# Patient Record
Sex: Female | Born: 1970 | Race: Black or African American | Hispanic: No | Marital: Single
Health system: Midwestern US, Community
[De-identification: ages and names within clinical notes are randomized; demographics above are authoritative.]

## PROBLEM LIST (undated history)

## (undated) VITALS — BP 115/79 | HR 109 | Temp 98.2°F | Resp 16 | Ht 61.5 in | Wt 134.0 lb

## (undated) DIAGNOSIS — R928 Other abnormal and inconclusive findings on diagnostic imaging of breast: Principal | ICD-10-CM

## (undated) DIAGNOSIS — Q396 Congenital diverticulum of esophagus: Principal | ICD-10-CM

## (undated) DIAGNOSIS — Z1231 Encounter for screening mammogram for malignant neoplasm of breast: Principal | ICD-10-CM

## (undated) DIAGNOSIS — R197 Diarrhea, unspecified: Secondary | ICD-10-CM

## (undated) DIAGNOSIS — B349 Viral infection, unspecified: Principal | ICD-10-CM

## (undated) DIAGNOSIS — K5730 Diverticulosis of large intestine without perforation or abscess without bleeding: Principal | ICD-10-CM

## (undated) DIAGNOSIS — M797 Fibromyalgia: Secondary | ICD-10-CM

## (undated) DIAGNOSIS — M719 Bursopathy, unspecified: Secondary | ICD-10-CM

## (undated) DIAGNOSIS — D573 Sickle-cell trait: Secondary | ICD-10-CM

## (undated) HISTORY — PX: OTHER SURGICAL HISTORY: SHX169

## (undated) HISTORY — PX: CHOLECYSTECTOMY: SHX55

## (undated) HISTORY — PX: BREAST LUMPECTOMY: SHX2

---

## 1998-04-09 ENCOUNTER — Emergency Department (HOSPITAL_COMMUNITY): Admission: EM | Admit: 1998-04-09 | Discharge: 1998-04-09 | Payer: Self-pay | Admitting: Emergency Medicine

## 1998-05-12 ENCOUNTER — Ambulatory Visit (HOSPITAL_COMMUNITY): Admission: RE | Admit: 1998-05-12 | Discharge: 1998-05-13 | Payer: Self-pay | Admitting: General Surgery

## 1999-01-13 ENCOUNTER — Emergency Department (HOSPITAL_COMMUNITY): Admission: EM | Admit: 1999-01-13 | Discharge: 1999-01-13 | Payer: Self-pay | Admitting: Emergency Medicine

## 2001-04-24 ENCOUNTER — Encounter: Payer: Self-pay | Admitting: Emergency Medicine

## 2001-04-24 ENCOUNTER — Emergency Department (HOSPITAL_COMMUNITY): Admission: EM | Admit: 2001-04-24 | Discharge: 2001-04-24 | Payer: Self-pay | Admitting: Emergency Medicine

## 2001-04-27 ENCOUNTER — Emergency Department (HOSPITAL_COMMUNITY): Admission: EM | Admit: 2001-04-27 | Discharge: 2001-04-27 | Payer: Self-pay | Admitting: Emergency Medicine

## 2001-05-22 ENCOUNTER — Encounter: Admission: RE | Admit: 2001-05-22 | Discharge: 2001-05-22 | Payer: Self-pay | Admitting: Internal Medicine

## 2002-03-13 ENCOUNTER — Encounter: Admission: RE | Admit: 2002-03-13 | Discharge: 2002-03-13 | Payer: Self-pay | Admitting: Internal Medicine

## 2002-03-18 ENCOUNTER — Encounter: Admission: RE | Admit: 2002-03-18 | Discharge: 2002-03-18 | Payer: Self-pay | Admitting: Internal Medicine

## 2002-04-16 ENCOUNTER — Encounter: Admission: RE | Admit: 2002-04-16 | Discharge: 2002-04-16 | Payer: Self-pay

## 2002-05-07 ENCOUNTER — Encounter: Admission: RE | Admit: 2002-05-07 | Discharge: 2002-05-07 | Payer: Self-pay | Admitting: Internal Medicine

## 2002-07-08 ENCOUNTER — Encounter: Admission: RE | Admit: 2002-07-08 | Discharge: 2002-07-08 | Payer: Self-pay | Admitting: Internal Medicine

## 2002-11-03 ENCOUNTER — Emergency Department (HOSPITAL_COMMUNITY): Admission: EM | Admit: 2002-11-03 | Discharge: 2002-11-04 | Payer: Self-pay | Admitting: Emergency Medicine

## 2003-02-28 ENCOUNTER — Encounter: Payer: Self-pay | Admitting: *Deleted

## 2003-02-28 ENCOUNTER — Emergency Department (HOSPITAL_COMMUNITY): Admission: EM | Admit: 2003-02-28 | Discharge: 2003-02-28 | Payer: Self-pay | Admitting: Emergency Medicine

## 2003-03-01 ENCOUNTER — Ambulatory Visit (HOSPITAL_COMMUNITY): Admission: RE | Admit: 2003-03-01 | Discharge: 2003-03-01 | Payer: Self-pay | Admitting: Emergency Medicine

## 2003-05-16 ENCOUNTER — Encounter: Payer: Self-pay | Admitting: Internal Medicine

## 2003-05-16 ENCOUNTER — Inpatient Hospital Stay (HOSPITAL_COMMUNITY): Admission: EM | Admit: 2003-05-16 | Discharge: 2003-05-18 | Payer: Self-pay | Admitting: Emergency Medicine

## 2003-05-17 ENCOUNTER — Encounter: Payer: Self-pay | Admitting: Internal Medicine

## 2003-06-18 ENCOUNTER — Encounter: Admission: RE | Admit: 2003-06-18 | Discharge: 2003-06-18 | Payer: Self-pay | Admitting: Internal Medicine

## 2004-02-16 ENCOUNTER — Emergency Department (HOSPITAL_COMMUNITY): Admission: EM | Admit: 2004-02-16 | Discharge: 2004-02-16 | Payer: Self-pay | Admitting: Emergency Medicine

## 2005-07-12 ENCOUNTER — Emergency Department (HOSPITAL_COMMUNITY): Admission: EM | Admit: 2005-07-12 | Discharge: 2005-07-12 | Payer: Self-pay | Admitting: Emergency Medicine

## 2006-03-07 ENCOUNTER — Emergency Department (HOSPITAL_COMMUNITY): Admission: EM | Admit: 2006-03-07 | Discharge: 2006-03-07 | Payer: Self-pay | Admitting: Emergency Medicine

## 2006-03-14 ENCOUNTER — Encounter: Admission: RE | Admit: 2006-03-14 | Discharge: 2006-03-14 | Payer: Self-pay | Admitting: Gastroenterology

## 2006-03-21 ENCOUNTER — Encounter: Admission: RE | Admit: 2006-03-21 | Discharge: 2006-03-21 | Payer: Self-pay | Admitting: Gastroenterology

## 2007-01-22 ENCOUNTER — Emergency Department (HOSPITAL_COMMUNITY): Admission: EM | Admit: 2007-01-22 | Discharge: 2007-01-22 | Payer: Self-pay | Admitting: Emergency Medicine

## 2008-07-17 ENCOUNTER — Ambulatory Visit (HOSPITAL_COMMUNITY): Admission: RE | Admit: 2008-07-17 | Discharge: 2008-07-17 | Payer: Self-pay | Admitting: Obstetrics and Gynecology

## 2009-04-05 ENCOUNTER — Other Ambulatory Visit (HOSPITAL_COMMUNITY): Admission: RE | Admit: 2009-04-05 | Discharge: 2009-04-21 | Payer: Self-pay | Admitting: Psychiatry

## 2009-04-06 ENCOUNTER — Ambulatory Visit: Payer: Self-pay | Admitting: Psychiatry

## 2009-04-29 ENCOUNTER — Encounter: Admission: RE | Admit: 2009-04-29 | Discharge: 2009-04-29 | Payer: Self-pay | Admitting: Internal Medicine

## 2009-09-16 ENCOUNTER — Emergency Department (HOSPITAL_COMMUNITY): Admission: EM | Admit: 2009-09-16 | Discharge: 2009-09-16 | Payer: Self-pay | Admitting: Emergency Medicine

## 2010-01-13 ENCOUNTER — Emergency Department (HOSPITAL_COMMUNITY): Admission: EM | Admit: 2010-01-13 | Discharge: 2010-01-13 | Payer: Self-pay | Admitting: Emergency Medicine

## 2010-03-18 ENCOUNTER — Encounter: Admission: RE | Admit: 2010-03-18 | Discharge: 2010-03-18 | Payer: Self-pay | Admitting: Obstetrics and Gynecology

## 2011-03-12 LAB — LIPASE, BLOOD: Lipase: 17 U/L (ref 11–59)

## 2011-03-12 LAB — DIFFERENTIAL
Basophils Absolute: 0.4 10*3/uL — ABNORMAL HIGH (ref 0.0–0.1)
Basophils Relative: 4 % — ABNORMAL HIGH (ref 0–1)
Eosinophils Relative: 2 % (ref 0–5)
Lymphocytes Relative: 41 % (ref 12–46)
Monocytes Absolute: 0.7 10*3/uL (ref 0.1–1.0)
Monocytes Relative: 7 % (ref 3–12)
Neutro Abs: 4.8 10*3/uL (ref 1.7–7.7)
Neutrophils Relative %: 47 % (ref 43–77)

## 2011-03-12 LAB — RETICULOCYTES
RBC.: 4.14 MIL/uL (ref 3.87–5.11)
Retic Count, Absolute: 45.5 10*3/uL (ref 19.0–186.0)
Retic Ct Pct: 1.1 % (ref 0.4–3.1)

## 2011-03-12 LAB — CBC
HCT: 37.7 % (ref 36.0–46.0)
MCHC: 34.1 g/dL (ref 30.0–36.0)
MCV: 88.3 fL (ref 78.0–100.0)
Platelets: 361 10*3/uL (ref 150–400)
WBC: 10.3 10*3/uL (ref 4.0–10.5)

## 2011-03-12 LAB — COMPREHENSIVE METABOLIC PANEL
Albumin: 3.5 g/dL (ref 3.5–5.2)
BUN: 8 mg/dL (ref 6–23)
Calcium: 9.1 mg/dL (ref 8.4–10.5)
GFR calc non Af Amer: >60 mL/min (ref >60)
Glucose, Bld: 98 mg/dL (ref 70–99)
Potassium: 3.8 mEq/L (ref 3.5–5.1)
Total Protein: 6.6 g/dL (ref 6.0–8.3)

## 2011-03-31 LAB — CBC
HCT: 40.9 % (ref 36.0–46.0)
Hemoglobin: 14.1 g/dL (ref 12.0–15.0)
MCV: 86.8 fL (ref 78.0–100.0)
RBC: 4.71 MIL/uL (ref 3.87–5.11)
RDW: 14.1 % (ref 11.5–15.5)
WBC: 12.9 10*3/uL — ABNORMAL HIGH (ref 4.0–10.5)

## 2011-03-31 LAB — COMPREHENSIVE METABOLIC PANEL
AST: 33 U/L (ref 0–37)
Calcium: 9.3 mg/dL (ref 8.4–10.5)
Creatinine, Ser: 0.78 mg/dL (ref 0.4–1.2)
Total Bilirubin: 0.7 mg/dL (ref 0.3–1.2)
Total Protein: 7.3 g/dL (ref 6.0–8.3)

## 2011-03-31 LAB — DIFFERENTIAL
Basophils Absolute: 0.1 10*3/uL (ref 0.0–0.1)
Basophils Relative: 1 % (ref 0–1)
Eosinophils Relative: 0 % (ref 0–5)
Lymphocytes Relative: 26 % (ref 12–46)
Lymphs Abs: 3.4 10*3/uL (ref 0.7–4.0)
Monocytes Absolute: 0.4 10*3/uL (ref 0.1–1.0)
Monocytes Relative: 4 % (ref 3–12)
Neutrophils Relative %: 69 % (ref 43–77)

## 2011-03-31 LAB — URINE MICROSCOPIC-ADD ON

## 2011-03-31 LAB — URINALYSIS, ROUTINE W REFLEX MICROSCOPIC
Bilirubin Urine: NEGATIVE
Glucose, UA: NEGATIVE mg/dL
Nitrite: NEGATIVE
Specific Gravity, Urine: 1.026 (ref 1.005–1.030)

## 2011-05-02 ENCOUNTER — Other Ambulatory Visit: Payer: Self-pay | Admitting: Obstetrics and Gynecology

## 2011-05-02 DIAGNOSIS — Z1231 Encounter for screening mammogram for malignant neoplasm of breast: Secondary | ICD-10-CM

## 2011-05-09 ENCOUNTER — Ambulatory Visit: Payer: Self-pay

## 2011-05-12 NOTE — Discharge Summary (Signed)
NAME:  Melissa Maynard, Melissa Maynard                          ACCOUNT NO.:  [id number]   MEDICAL RECORD NO.:  [id number]                   PATIENT TYPE:  INP   LOCATION:  5524                                 FACILITY:  [facility]   PHYSICIAN:  Catalina Pizza, M.D.                     DATE OF BIRTH:  06-30-1971   DATE OF ADMISSION:  05/16/2003  DATE OF DISCHARGE:  05/18/2003                                 DISCHARGE SUMMARY   DISCHARGE DIAGNOSES:  1. Left leg swelling.  2. Mild anemia.  3. Trichomonas infection.  4. Multiple kidney lesions consistent with angiomyolipomas.   DISCHARGE MEDICATION:  Ibuprofen 800 mg p.o. q.8h. p.r.n. for pain.   CHIEF COMPLAINT:  Swelling and pain in the left leg.   HISTORY OF PRESENT ILLNESS:  This is a 40 year old African American female  with minimal past medical history who presented to the emergency department  with five day history of left leg swelling and pain for the past three days.  A throbbing pain with pain radiating up into the hip.  She has felt like she  had chills but no fevers, no nausea or vomiting.  She apparently had similar  episode several months ago and was given blood thinner and antibiotics but  the work-up at that time was totally negative for blood clot and thought to  be related to infection.  Only worked up in the emergency department and  there was no follow-up.  She states that she had had several episodes of  swelling periodically since the initial episode but they have resolved  within the same day.  Pain in her leg and swelling is limiting her mobility  at this time.  She also notes that she has had some small oral lesions which  have now resolved.   ALLERGIES:  No known drug allergies.   MEDICATIONS ON ADMISSION:  1. Vicodin 5/500 p.r.n.  2. Aleve p.r.n.  3. Just received first dose of Depo-Provera one month previous.   PAST MEDICAL HISTORY:  1. Sickle cell trait.  2. Status post cholecystectomy in 1999.  3. Cyst removed from left  breast which was benign.   HABITS:  She smokes approximately half a pack for day for 12 days.  No  alcohol or drug use.   SOCIAL HISTORY:  She is single.  She works at [employer] and has lived in  [place], [place], for seven to eight years when she moved from  [place].   FAMILY HISTORY:  Mother is alive at 15 and has sickle cell anemia.  No other  problems in father, siblings, and no children.  She has history of a  grandmother who has breast cancer and a grandfather who had lung cancer.   PHYSICAL EXAMINATION UPON DISCHARGE:  GENERAL APPEARANCE:  African American  female in no acute distress.  VITAL SIGNS:  Temperature 98.7, blood pressure 99/51, pulse  75, respiratory  rate 18, 97% on room air.  HEENT:  Totally negative with no signs of oral lesions.  LUNGS:  Clear to auscultation bilaterally.  CARDIOVASCULAR:  Regular rate and rhythm with no murmurs, rubs, or gallops.  ABDOMEN:  Benign.  EXTREMITIES:  Grossly normal and symmetric upon discharge.  Upon admission,  she had significantly larger left thigh and calf as compared to her right.  She had good range of motion and 2+ pulses in all extremities.   SIGNIFICANT LABS DURING ADMISSION:  Slight Y count of 11.2.  Hemoglobin upon  discharge 12.2, hematocrit 35.3, MCV 82.5, platelets 417.  D-dimer less than  0.22.  CMET was totally normal.  TSH was within normal range at 1.868.  UA  showed small leukocyte esterase and few bacteria.  Trichomonas was noted.   STUDIES OBTAINED DURING HOSPITALIZATION:  1. Chest x-ray was read as negative for acute cardiac or pulmonary process.  2. CT scan of the abdomen and pelvis revealed multiple fatty lesions within     the kidneys that demonstrated no findings consistent with     angiomyolipomas.  Abdominal findings and pelvic findings were negative.  3. MRI of abdomen showed the presence of bilateral lesions consistent with     angiomyolipomas which would suggest tuber sclerosis but all  lesions were     less than 1 cm and suggest following up on these in the future.  4. Lower extremity Dopplers.  There was no evidence of DVT, superficial     thrombosis or Baker's cyst.   HOSPITAL COURSE:  PROBLEM #1 -  LEFT LEG SWELLING:  Unknown what the reason  for this difference in lower extremity swelling was.  All findings were  negative on examination as well as were negative on all tests performed on  her  left leg and this resolved without any significant treatment.  Dopplers  were negative and unknown exactly what the cause of this was.  Follow-up may  be some kind of intra-abdominal compression on vessels but nothing seen on  CT scan or MRI.   PROBLEM #2 -  TRICHOMONAS:  The patient was treated with 2 g dose of Flagyl.  She had not had any symptoms both related to the infection or to the  medications.   PROBLEM #3 -  MILD ANEMIA:  The patient's hemoglobin stayed in the stable  range from 11.5 to 12.5 during hospitalization.  I do not think this is  significant enough to work up any anemia.   PROBLEM #4 -  RENAL CYSTS:  Consistent with angiomyolipomas.  The patient  apparently had known about this before.  There was concern of tuber  sclerosis that was worked up while she was in [place] with head CT and  MRI which have all been negative but she does not exhibit any external  findings associated with tuber sclerosis nor cognitive disability, but on  the CT and MRI findings they showed small bilateral lesions in the kidney  consistent with angiomyolipomas and suggest that following this up with  periodic CT scan may be appropriate because if these lesions get larger,  they can cause bleeding.   DISPOSITION:  The patient is to call the outpatient clinic at [phone] if  she has any more swelling in her legs.  She is to follow up with Dr. Margo Aye on  June 18, 2003, at 10:50 a.m. for hospital follow-up.  Catalina Pizza,  M.D.  ZH/MEDQ  D:  11/05/2003  T:  11/06/2003  Job:  [id number]

## 2011-05-16 ENCOUNTER — Ambulatory Visit
Admission: RE | Admit: 2011-05-16 | Discharge: 2011-05-16 | Disposition: A | Discharge status: Home / Self Care | Payer: BC Managed Care – PPO | Source: Ambulatory Visit | Attending: Obstetrics and Gynecology | Admitting: Obstetrics and Gynecology

## 2011-05-16 DIAGNOSIS — Z1231 Encounter for screening mammogram for malignant neoplasm of breast: Secondary | ICD-10-CM

## 2011-06-19 ENCOUNTER — Other Ambulatory Visit: Payer: Self-pay | Admitting: Obstetrics and Gynecology

## 2011-06-19 ENCOUNTER — Ambulatory Visit (HOSPITAL_COMMUNITY)
Admission: RE | Admit: 2011-06-19 | Discharge: 2011-06-19 | Disposition: A | Discharge status: Home / Self Care | Payer: BC Managed Care – PPO | Source: Ambulatory Visit | Attending: Obstetrics and Gynecology | Admitting: Obstetrics and Gynecology

## 2011-06-19 DIAGNOSIS — N920 Excessive and frequent menstruation with regular cycle: Secondary | ICD-10-CM | POA: Insufficient documentation

## 2011-06-19 LAB — CBC
HCT: 37.3 % (ref 36.0–46.0)
MCV: 83.8 fL (ref 78.0–100.0)
Platelets: 391 10*3/uL (ref 150–400)
RDW: 14.1 % (ref 11.5–15.5)

## 2011-06-30 NOTE — Op Note (Signed)
  NAMEROYALTI, SCHAUF                ACCOUNT NO.:  [id number]  MEDICAL RECORD NO.:  [id number]  LOCATION:  [facility]                          FACILITY:  [facility]  PHYSICIAN:  Lenoard Aden, M.D.DATE OF BIRTH:  1971-02-07  DATE OF PROCEDURE:  06/19/2011 DATE OF DISCHARGE:                              OPERATIVE REPORT   PREOPERATIVE DIAGNOSIS:  Refractory menorrhagia.  POSTOPERATIVE DIAGNOSIS:  Refractory menorrhagia.  PROCEDURE:  Diagnostic hysteroscopy, dilatation and curettage, Novasure endometrial ablation.  SURGEON:  Lenoard Aden, MD  ASSISTANT:  None.  ANESTHESIA:  General and local.  ESTIMATED BLOOD LOSS:  Less than 50 mL.  FLUID DEFICIT:  50 mL.  COMPLICATIONS:  None.  DRAINS:  None.  COUNTS:  Correct.  CONDITION:  The patient went to recovery in good condition.  SPECIMENS:  Endometrial curettings to pathology.  BRIEF OPERATIVE NOTE:  After being apprised of risks of anesthesia, infection, bleeding, injury to intra-abdominal organs, need for repair, delayed versus immediate complications including bowel and bladder injury, possible need for repair, inability to cure all menstrual bleeding, the patient was brought to the operating room.  She was administered general anesthetic without complications.  She was prepped and draped in the usual sterile fashion.  Foley catheterized until the bladder was empty.  Exam under anesthesia revealed a small anteflexed uterus and no adnexal masses.  At this time, a weighted speculum was placed and dilute paracervical block using 20 mL of dilute Marcaine solution was placed.  Cervix was easily dilated up to #[facility] dilator. Hysteroscope was placed.  Visualization reveals normal endometrial cavity.  Endometrial curettings were collected in a four-quadrant method using sharp curettage.  At this time, the Novasure device was placed. Settings are rendered at 6 cm for adjusted length and seated to a width of 4 cm.  The CO2 test  was performed and negative.  The procedure was then initiated for 1 minute and 25 seconds without complications. Devices were removed, inspected, and found to be intact. Revisualization of the endometrial cavity reveals a well-ablated endometrial cavity.  No evidence of perforation.  Good hemostasis noted. The patient tolerated the procedure well, was awakened and transferred to recovery in good condition.     Lenoard Aden, M.D.     RJT/MEDQ  D:  06/19/2011  T:  06/20/2011  Job:  [id number]  Electronically Signed by Olivia Mackie M.D. on 06/30/2011 07:35:53 AM

## 2011-11-06 ENCOUNTER — Encounter: Payer: Self-pay | Admitting: *Deleted

## 2011-11-06 ENCOUNTER — Emergency Department (HOSPITAL_COMMUNITY)
Admission: EM | Admit: 2011-11-06 | Discharge: 2011-11-06 | Discharge status: Against Medical Advice | Payer: BC Managed Care – PPO | Attending: Emergency Medicine | Admitting: Emergency Medicine

## 2011-11-06 ENCOUNTER — Emergency Department (HOSPITAL_COMMUNITY): Payer: BC Managed Care – PPO

## 2011-11-06 DIAGNOSIS — R0602 Shortness of breath: Secondary | ICD-10-CM | POA: Insufficient documentation

## 2011-11-06 DIAGNOSIS — M791 Myalgia, unspecified site: Secondary | ICD-10-CM

## 2011-11-06 DIAGNOSIS — R079 Chest pain, unspecified: Secondary | ICD-10-CM | POA: Insufficient documentation

## 2011-11-06 DIAGNOSIS — R5381 Other malaise: Secondary | ICD-10-CM | POA: Insufficient documentation

## 2011-11-06 DIAGNOSIS — IMO0001 Reserved for inherently not codable concepts without codable children: Secondary | ICD-10-CM | POA: Insufficient documentation

## 2011-11-06 DIAGNOSIS — D573 Sickle-cell trait: Secondary | ICD-10-CM | POA: Insufficient documentation

## 2011-11-06 HISTORY — DX: Sickle-cell trait: D57.3

## 2011-11-06 HISTORY — DX: Fibromyalgia: M79.7

## 2011-11-06 LAB — URINALYSIS, ROUTINE W REFLEX MICROSCOPIC
Bilirubin Urine: NEGATIVE
Glucose, UA: NEGATIVE mg/dL
Hgb urine dipstick: NEGATIVE
Ketones, ur: NEGATIVE mg/dL
Leukocytes, UA: NEGATIVE
Nitrite: NEGATIVE
Protein, ur: NEGATIVE mg/dL
Specific Gravity, Urine: 1.014 (ref 1.005–1.030)
Urobilinogen, UA: 1 mg/dL (ref 0.0–1.0)
pH: 7 (ref 5.0–8.0)

## 2011-11-06 MED ORDER — IBUPROFEN 800 MG PO TABS: 800.0000 mg | ORAL_TABLET | Freq: Three times a day (TID) | ORAL | Status: AC | PRN

## 2011-11-06 MED ORDER — KETOROLAC TROMETHAMINE 30 MG/ML IJ SOLN: 60.0000 mg | Freq: Once | INTRAMUSCULAR | Status: DC

## 2011-11-06 MED ORDER — OXYCODONE-ACETAMINOPHEN 5-325 MG PO TABS: 1.0000 | ORAL_TABLET | Freq: Four times a day (QID) | ORAL | Status: AC | PRN

## 2011-11-06 MED ORDER — HEPARIN SOD (PORK) LOCK FLUSH 100 UNIT/ML IV SOLN
INTRAVENOUS | Status: AC
  Filled 2011-11-06: qty 5

## 2011-11-06 NOTE — ED Notes (Signed)
I-STAT results shown to PA Lawyer, due to lab not crossing over. Results all within normal limits

## 2011-11-06 NOTE — ED Notes (Signed)
When I went to given the pt. Her med. She was not in the room.

## 2011-11-06 NOTE — ED Notes (Signed)
Unable to find pt

## 2011-11-06 NOTE — ED Provider Notes (Signed)
History     CSN: [id number] Arrival date & time: 11/06/2011  5:51 PM   First MD Initiated Contact with Patient 11/06/11 1801      Chief Complaint  Patient presents with  . Generalized Body Aches    (Consider location/radiation/quality/duration/timing/severity/associated sxs/prior treatment) HPI Patient presents with three-day history of bodyaches, and generalized fatigue.  Patient states, that she has had some mild shortness of breath today, but no cough no runny nose, sore throat, fever, vomiting, nausea, headache, diarrhea, visual changes or chest pain.  Patient states, that she has not had consistent shortness of breath, which she thinks may be related to some anxiety.  Patient states she has a history of fibromyalgia and this could be contributing to her body aches.  Past Medical History  Diagnosis Date  . Fibromyalgia   . Sickle cell trait     Past Surgical History  Procedure Date  . Cholecystectomy     No family history on file.  History  Substance Use Topics  . Smoking status: Current Some Day Smoker  . Smokeless tobacco: Not on file  . Alcohol Use: Yes     occasionally    OB History    Grav Para Term Preterm Abortions TAB SAB Ect Mult Living                  Review of Systems  Constitutional: Positive for fatigue. Negative for fever.  All other systems reviewed and are negative.    Allergies  Review of patient's allergies indicates no known allergies.  Home Medications   Current Outpatient Rx  Name Route Sig Dispense Refill  . ALPRAZOLAM 1 MG PO TABS Oral Take 1 mg by mouth 3 (three) times daily as needed. For anxiety.     . AMITRIPTYLINE HCL 25 MG PO TABS Oral Take 25-50 mg by mouth at bedtime.      Marland Kitchen VITAMIN D 1000 UNITS PO TABS Oral Take 1,000 Units by mouth daily.      . DULOXETINE HCL 20 MG PO CPEP Oral Take 20 mg by mouth daily.      Marland Kitchen HYDROCODONE-ACETAMINOPHEN 5-500 MG PO TABS Oral Take 1-2 tablets by mouth every 6 (six) hours as needed. For  pain.     Carma Leaven M PLUS PO TABS Oral Take 1 tablet by mouth daily.      Marland Kitchen VITAMIN B-12 1000 MCG PO TABS Oral Take 1,000 mcg by mouth daily.      Marland Kitchen VITAMIN C 500 MG PO TABS Oral Take 500 mg by mouth daily.        BP 138/88  Pulse 91  Temp(Src) 98.9 F (37.2 C) (Oral)  Resp 18  SpO2 100%  Physical Exam  Nursing note and vitals reviewed. Constitutional: She is oriented to person, place, and time. She appears well-developed and well-nourished. No distress.  HENT:  Head: Normocephalic and atraumatic.  Right Ear: Tympanic membrane normal.  Left Ear: Tympanic membrane normal.  Nose: Nose normal.  Mouth/Throat: Uvula is midline, oropharynx is clear and moist and mucous membranes are normal. No uvula swelling. No oropharyngeal exudate, posterior oropharyngeal edema, posterior oropharyngeal erythema or tonsillar abscesses.  Eyes: Pupils are equal, round, and reactive to light.  Neck: Normal range of motion. Neck supple.  Cardiovascular: Normal rate, regular rhythm and normal heart sounds.   Pulmonary/Chest: Effort normal and breath sounds normal.  Abdominal: Soft. Bowel sounds are normal. There is no tenderness. There is no rebound and no guarding.  Musculoskeletal: She exhibits  no edema.  Neurological: She is alert and oriented to person, place, and time. No cranial nerve deficit. Coordination normal.  Skin: Skin is warm and dry. No rash noted.  Psychiatric: She has a normal mood and affect. Her behavior is normal. Judgment and thought content normal.    ED Course  Procedures (including critical care time)  Labs Reviewed  URINALYSIS, ROUTINE W REFLEX MICROSCOPIC - Abnormal; Notable for the following:    Appearance CLOUDY (*)    All other components within normal limits  I-STAT, CHEM 8   8:39 PM patient is awaiting results of her chest x-ray.  All of her other testing has been normal.    Results for orders placed during the hospital encounter of 11/06/11  URINALYSIS, ROUTINE W  REFLEX MICROSCOPIC      Component Value Range   Color, Urine YELLOW  YELLOW    Appearance CLOUDY (*) CLEAR    Specific Gravity, Urine 1.014  1.005 - 1.030    pH 7.0  5.0 - 8.0    Glucose, UA NEGATIVE  NEGATIVE (mg/dL)   Hgb urine dipstick NEGATIVE  NEGATIVE    Bilirubin Urine NEGATIVE  NEGATIVE    Ketones, ur NEGATIVE  NEGATIVE (mg/dL)   Protein, ur NEGATIVE  NEGATIVE (mg/dL)   Urobilinogen, UA 1.0  0.0 - 1.0 (mg/dL)   Nitrite NEGATIVE  NEGATIVE    Leukocytes, UA NEGATIVE  NEGATIVE    Dg Chest 2 View  11/06/2011  *RADIOLOGY REPORT*  Clinical Data: 40 year old female with shortness of breath, chest pain.  CHEST - 2 VIEW  Comparison: None.  Findings:  Cardiac size and mediastinal contours are within normal limits.  Mild increased interstitial markings diffusely.  No pneumothorax, pulmonary edema, pleural effusion or confluent pulmonary opacity.  Right upper quadrant surgical clips. Visualized tracheal air column is within normal limits.  No acute osseous abnormality identified.  IMPRESSION: Chronic-appearing increased interstitial markings. No acute cardiopulmonary abnormality.  Original Report Authenticated By: Harley Hallmark, M.D.    Chest x-ray results reviewed by me and there is no acute findings.     MDM  Patient has normal vital signs here in the emergency department, along with lab testing, as well as a normal chest.  Patient does have issues with anxiety and she states earlier that this may be resulting in some of her shortness of breath.  Has been intermittent throughout the day.  Patient has just generalized bodyaches, with no source that is obtainable at this time.  Patient will be given pain control and advised to return here as needed.  For any worsening in her condition.  She is advised to follow up with her doctor for recheck.        Melissa Maynard [place], [place] 11/06/11 2042

## 2011-11-06 NOTE — ED Notes (Signed)
Pt reports body aches x 3 days, and SOB since yesterday.  Pt reports hx of fibromyalgia.  Denies any fever at present.

## 2011-11-07 NOTE — ED Provider Notes (Signed)
Medical screening examination/treatment/procedure(s) were performed by non-physician practitioner and as supervising physician I was immediately available for consultation/collaboration.    Patrisha Hausmann R Christop Hippert, MD 11/07/11 0000 

## 2011-11-08 LAB — POCT I-STAT, CHEM 8
Calcium, Ion: 1.12 mmol/L (ref 1.12–1.32)
Hemoglobin: 12.9 g/dL (ref 12.0–15.0)
Sodium: 139 mEq/L (ref 135–145)
TCO2: 27 mmol/L (ref 0–100)

## 2012-05-15 ENCOUNTER — Emergency Department (HOSPITAL_COMMUNITY): Payer: Self-pay

## 2012-05-15 ENCOUNTER — Encounter (HOSPITAL_COMMUNITY): Payer: Self-pay | Admitting: *Deleted

## 2012-05-15 ENCOUNTER — Emergency Department (HOSPITAL_COMMUNITY)
Admission: EM | Admit: 2012-05-15 | Discharge: 2012-05-15 | Disposition: A | Discharge status: Home / Self Care | Payer: Self-pay | Attending: Emergency Medicine | Admitting: Emergency Medicine

## 2012-05-15 DIAGNOSIS — IMO0001 Reserved for inherently not codable concepts without codable children: Secondary | ICD-10-CM | POA: Insufficient documentation

## 2012-05-15 DIAGNOSIS — F17200 Nicotine dependence, unspecified, uncomplicated: Secondary | ICD-10-CM | POA: Insufficient documentation

## 2012-05-15 DIAGNOSIS — R0602 Shortness of breath: Secondary | ICD-10-CM | POA: Insufficient documentation

## 2012-05-15 DIAGNOSIS — J069 Acute upper respiratory infection, unspecified: Secondary | ICD-10-CM | POA: Insufficient documentation

## 2012-05-15 DIAGNOSIS — R062 Wheezing: Secondary | ICD-10-CM | POA: Insufficient documentation

## 2012-05-15 DIAGNOSIS — R059 Cough, unspecified: Secondary | ICD-10-CM | POA: Insufficient documentation

## 2012-05-15 DIAGNOSIS — R05 Cough: Secondary | ICD-10-CM | POA: Insufficient documentation

## 2012-05-15 MED ORDER — ALBUTEROL SULFATE (5 MG/ML) 0.5% IN NEBU
5.0000 mg | INHALATION_SOLUTION | Freq: Once | RESPIRATORY_TRACT | Status: AC
  Administered 2012-05-15: 5 mg via RESPIRATORY_TRACT
  Filled 2012-05-15: qty 1

## 2012-05-15 MED ORDER — PREDNISONE 20 MG PO TABS
60.0000 mg | ORAL_TABLET | Freq: Once | ORAL | Status: AC
  Administered 2012-05-15: 60 mg via ORAL
  Filled 2012-05-15: qty 3

## 2012-05-15 MED ORDER — IPRATROPIUM BROMIDE 0.02 % IN SOLN
0.5000 mg | Freq: Once | RESPIRATORY_TRACT | Status: AC
  Administered 2012-05-15: 0.5 mg via RESPIRATORY_TRACT
  Filled 2012-05-15: qty 2.5

## 2012-05-15 MED ORDER — PREDNISONE 20 MG PO TABS: 60.0000 mg | ORAL_TABLET | Freq: Every day | ORAL | Status: AC

## 2012-05-15 NOTE — Discharge Instructions (Signed)
Read the instructions below on reasons to return to the emergency department and to learn more about your diagnosis.  Use over the counter medications for symptomatic relief as we discussed (musinex as a decongestant, Tylenol for fever/pain, Motrin/Ibuprofen for muscle aches).  Followup with your primary care doctor in 4 days if your symptoms persist.  Your more than welcome to return to the emergency department if symptoms worsen or become concerning. ° °Upper Respiratory Infection, Adult  °An upper respiratory infection (URI) is also sometimes known as the common cold. Most people improve within 1 week, but symptoms can last up to 2 weeks. A residual cough may last even longer.  ° °URI is most commonly caused by a virus. Viruses are NOT treated with antibiotics. You can easily spread the virus to others by oral contact. This includes kissing, sharing a glass, coughing, or sneezing. Touching your mouth or nose and then touching a surface, which is then touched by another person, can also spread the virus.  ° °TREATMENT  °Treatment is directed at relieving symptoms. There is no cure. Antibiotics are not effective, because the infection is caused by a virus, not by bacteria. Treatment may include:  °Increased fluid intake. Sports drinks offer valuable electrolytes, sugars, and fluids.  °Breathing heated mist or steam (vaporizer or shower).  °Eating chicken soup or other clear broths, and maintaining good nutrition.  °Getting plenty of rest.  °Using gargles or lozenges for comfort.  °Controlling fevers with ibuprofen or acetaminophen as directed by your caregiver.  °Increasing usage of your inhaler if you have asthma.  °Return to work when your temperature has returned to normal.  ° °SEEK MEDICAL CARE IF:  °After the first few days, you feel you are getting worse rather than better.  °You develop worsening shortness of breath, or brown or red sputum. These may be signs of pneumonia.  °You develop yellow or brown  nasal discharge or pain in the face, especially when you bend forward. These may be signs of sinusitis.  °You develop a fever, swollen neck glands, pain with swallowing, or white areas in the back of your throat. These may be signs of strep throat.  ° °

## 2012-05-15 NOTE — ED Notes (Signed)
Pt reports history of bronchitis. Pt reports symptoms started this weekend. Pt has used nebulizer once a day and inhaler twice today.  Pt reports no improvement in symptoms. Pt reports productive cough. Pt reports sputum is green.  Pt denies noting fevers.  Pt denies smoking. Pt has expiratory and inspiratory wheezing noted.   Pt reports knee pain that is worse on left knee.  Pt denies injury or increased activity.  Pt has had a Vicodin for pain this AM and states this medication did not help pain.

## 2012-05-15 NOTE — ED Notes (Signed)
Resp therapy at pt bedside finishing pt breathing treatment. Pt in no apparent distress.

## 2012-05-15 NOTE — ED Notes (Signed)
Pt states she started to have chest congestion over the weekend. Pt states she is using an inhaler and started to have a productive cough. Pt also states she is having generalized body aches

## 2012-05-15 NOTE — ED Provider Notes (Signed)
History     CSN: [id number]  Arrival date & time 05/15/12  1605   First MD Initiated Contact with Patient 05/15/12 1646      Chief Complaint  Patient presents with  . URI    (Consider location/radiation/quality/duration/timing/severity/associated sxs/prior treatment) HPI Comments: Patient presenting with productive cough, wheezing, and nasal congestion for the past 4 days.  She has tried using Robitussion, Mucinex, and used her Albuterol inhaler with mild relief.  She currently smokes 5 cigarettes/day.    Patient is a 42 y.o. female presenting with cough. The history is provided by the patient.  Cough This is a new problem. Episode onset: 4 days ago. The problem has been gradually worsening. The cough is productive of sputum. There has been no fever. Associated symptoms include rhinorrhea, myalgias, shortness of breath and wheezing. Pertinent negatives include no chest pain, no chills, no ear congestion, no ear pain and no sore throat. She is a smoker.    Past Medical History  Diagnosis Date  . Fibromyalgia   . Sickle cell trait     Past Surgical History  Procedure Date  . Cholecystectomy     No family history on file.  History  Substance Use Topics  . Smoking status: Current Some Day Smoker  . Smokeless tobacco: Not on file  . Alcohol Use: Yes     occasionally    OB History    Grav Para Term Preterm Abortions TAB SAB Ect Mult Living                  Review of Systems  Constitutional: Negative for fever, chills and diaphoresis.  HENT: Positive for congestion and rhinorrhea. Negative for ear pain, sore throat and sinus pressure.   Respiratory: Positive for cough, shortness of breath and wheezing.   Cardiovascular: Negative for chest pain and leg swelling.  Gastrointestinal: Negative for nausea and vomiting.  Musculoskeletal: Positive for myalgias.  Skin: Negative for rash.  Neurological: Negative for dizziness, syncope and light-headedness.    Allergies    Review of patient's allergies indicates no known allergies.  Home Medications   Current Outpatient Rx  Name Route Sig Dispense Refill  . ALPRAZOLAM 1 MG PO TABS Oral Take 1 mg by mouth 3 (three) times daily as needed. For anxiety.     . AMITRIPTYLINE HCL 25 MG PO TABS Oral Take 25-50 mg by mouth at bedtime.      Marland Kitchen VITAMIN D 1000 UNITS PO TABS Oral Take 1,000 Units by mouth daily.      . DULOXETINE HCL 20 MG PO CPEP Oral Take 20 mg by mouth daily.      Marland Kitchen HYDROCODONE-ACETAMINOPHEN 5-500 MG PO TABS Oral Take 1-2 tablets by mouth every 6 (six) hours as needed. For pain.     Carma Leaven M PLUS PO TABS Oral Take 1 tablet by mouth daily.      Marland Kitchen VITAMIN B-12 1000 MCG PO TABS Oral Take 1,000 mcg by mouth daily.      Marland Kitchen VITAMIN C 500 MG PO TABS Oral Take 500 mg by mouth daily.        BP 124/92  Pulse 91  Temp 98.2 F (36.8 C)  Resp 20  Ht 5\' 2"  (1.575 m)  Wt 170 lb (77.111 kg)  BMI 31.09 kg/m2  SpO2 100%  Physical Exam  Nursing note and vitals reviewed. Constitutional: She appears well-developed and well-nourished. No distress.  HENT:  Head: Normocephalic and atraumatic.  Right Ear: Tympanic membrane and ear  canal normal.  Left Ear: Tympanic membrane and ear canal normal.  Nose: Nose normal. Right sinus exhibits no maxillary sinus tenderness and no frontal sinus tenderness. Left sinus exhibits no maxillary sinus tenderness and no frontal sinus tenderness.  Mouth/Throat: Uvula is midline, oropharynx is clear and moist and mucous membranes are normal.  Neck: Normal range of motion. Neck supple.  Cardiovascular: Normal rate, regular rhythm and normal heart sounds.   Pulmonary/Chest: Effort normal. No accessory muscle usage. Not tachypneic. No respiratory distress. She has decreased breath sounds. She has wheezes. She has no rhonchi. She has no rales.       Diffuse expiratory wheezing. Patient able to speak in full sentences.  Musculoskeletal: Normal range of motion.  Neurological: She is  alert.  Skin: Skin is warm and dry. She is not diaphoretic.  Psychiatric: She has a normal mood and affect.    ED Course  Procedures (including critical care time)  Labs Reviewed - No data to display Dg Chest 2 View  05/15/2012  *RADIOLOGY REPORT*  Clinical Data: Chest pain.  Short of breath.  Cough.  CHEST - 2 VIEW  Comparison: 11/06/2011  Findings: Normal heart size. No consolidation or mass. Interstitial prominence.  Bronchitic changes.  No pneumothorax or pleural effusion.  IMPRESSION: No active cardiopulmonary disease.  Chronic changes.  Original Report Authenticated By: Donavan Burnet, M.D.     No diagnosis found.  5:47 PM Reassessed patient after breathing treatment.  She reports that she is feeling much better at this time.  Lungs CTAB.  MDM  Patient presenting with productive cough and wheezing.  No acute changes on xray.  Xray showing chronic bronchitic changes.  Patient given a breathing treatment while in ED, which she reports improved her symptoms considerably.  Patient also given course of Prednisone.  Return precautions discussed with patient.          Pascal Lux [place], PA-C 05/16/12 [phone]

## 2012-05-17 NOTE — ED Provider Notes (Signed)
Medical screening examination/treatment/procedure(s) were performed by non-physician practitioner and as supervising physician I was immediately available for consultation/collaboration.  Flint Melter, MD 05/17/12 1620

## 2012-11-22 ENCOUNTER — Emergency Department (HOSPITAL_COMMUNITY)
Admission: EM | Admit: 2012-11-22 | Discharge: 2012-11-22 | Disposition: A | Discharge status: Home / Self Care | Payer: Self-pay | Attending: Emergency Medicine | Admitting: Emergency Medicine

## 2012-11-22 ENCOUNTER — Encounter (HOSPITAL_COMMUNITY): Payer: Self-pay | Admitting: *Deleted

## 2012-11-22 ENCOUNTER — Emergency Department (HOSPITAL_COMMUNITY): Payer: Self-pay

## 2012-11-22 DIAGNOSIS — F17200 Nicotine dependence, unspecified, uncomplicated: Secondary | ICD-10-CM | POA: Insufficient documentation

## 2012-11-22 DIAGNOSIS — Z79899 Other long term (current) drug therapy: Secondary | ICD-10-CM | POA: Insufficient documentation

## 2012-11-22 DIAGNOSIS — D573 Sickle-cell trait: Secondary | ICD-10-CM | POA: Insufficient documentation

## 2012-11-22 DIAGNOSIS — Z9089 Acquired absence of other organs: Secondary | ICD-10-CM | POA: Insufficient documentation

## 2012-11-22 DIAGNOSIS — N12 Tubulo-interstitial nephritis, not specified as acute or chronic: Secondary | ICD-10-CM

## 2012-11-22 DIAGNOSIS — IMO0001 Reserved for inherently not codable concepts without codable children: Secondary | ICD-10-CM | POA: Insufficient documentation

## 2012-11-22 DIAGNOSIS — Z3202 Encounter for pregnancy test, result negative: Secondary | ICD-10-CM | POA: Insufficient documentation

## 2012-11-22 LAB — COMPREHENSIVE METABOLIC PANEL
ALT: 23 U/L (ref 0–35)
BUN: 9 mg/dL (ref 6–23)
CO2: 22 mEq/L (ref 19–32)
Calcium: 9.4 mg/dL (ref 8.4–10.5)
Creatinine, Ser: 0.57 mg/dL (ref 0.50–1.10)
GFR calc Af Amer: >90 mL/min (ref >90)
GFR calc non Af Amer: >90 mL/min (ref >90)
Glucose, Bld: 88 mg/dL (ref 70–99)
Sodium: 138 mEq/L (ref 135–145)
Total Protein: 7.1 g/dL (ref 6.0–8.3)

## 2012-11-22 LAB — CBC WITH DIFFERENTIAL/PLATELET
Eosinophils Absolute: 0 10*3/uL (ref 0.0–0.7)
Eosinophils Relative: 0 % (ref 0–5)
HCT: 36.1 % (ref 36.0–46.0)
Lymphocytes Relative: 22 % (ref 12–46)
Lymphs Abs: 1.7 10*3/uL (ref 0.7–4.0)
MCH: 28.9 pg (ref 26.0–34.0)
MCV: 82 fL (ref 78.0–100.0)
Monocytes Absolute: 0.5 10*3/uL (ref 0.1–1.0)
Platelets: 363 10*3/uL (ref 150–400)
RBC: 4.4 MIL/uL (ref 3.87–5.11)

## 2012-11-22 LAB — PREGNANCY, URINE: Preg Test, Ur: NEGATIVE

## 2012-11-22 LAB — URINALYSIS, ROUTINE W REFLEX MICROSCOPIC
Ketones, ur: >80 mg/dL — AB
Leukocytes, UA: NEGATIVE
Protein, ur: NEGATIVE mg/dL
Urobilinogen, UA: 1 mg/dL (ref 0.0–1.0)

## 2012-11-22 LAB — URINE MICROSCOPIC-ADD ON

## 2012-11-22 LAB — LIPASE, BLOOD: Lipase: 9 U/L — ABNORMAL LOW (ref 11–59)

## 2012-11-22 MED ORDER — ONDANSETRON 8 MG PO TBDP: 8.0000 mg | ORAL_TABLET | Freq: Three times a day (TID) | ORAL | Status: DC | PRN

## 2012-11-22 MED ORDER — MORPHINE SULFATE 4 MG/ML IJ SOLN: 6.0000 mg | INTRAMUSCULAR | Status: DC | PRN

## 2012-11-22 MED ORDER — ONDANSETRON HCL 4 MG/2ML IJ SOLN
4.0000 mg | Freq: Once | INTRAMUSCULAR | Status: AC
  Administered 2012-11-22: 4 mg via INTRAVENOUS
  Filled 2012-11-22: qty 2

## 2012-11-22 MED ORDER — SODIUM CHLORIDE 0.9 % IV SOLN
1000.0000 mL | Freq: Once | INTRAVENOUS | Status: AC
  Administered 2012-11-22: 1000 mL via INTRAVENOUS

## 2012-11-22 MED ORDER — SODIUM CHLORIDE 0.9 % IV SOLN
1000.0000 mL | INTRAVENOUS | Status: DC
  Administered 2012-11-22: 1000 mL via INTRAVENOUS

## 2012-11-22 MED ORDER — DEXTROSE 5 % IV SOLN
1.0000 g | Freq: Once | INTRAVENOUS | Status: AC
  Administered 2012-11-22: 1 g via INTRAVENOUS
  Filled 2012-11-22: qty 10

## 2012-11-22 MED ORDER — CEPHALEXIN 500 MG PO CAPS: 500.0000 mg | ORAL_CAPSULE | Freq: Four times a day (QID) | ORAL | Status: DC

## 2012-11-22 MED ORDER — MORPHINE SULFATE 4 MG/ML IJ SOLN
6.0000 mg | Freq: Once | INTRAMUSCULAR | Status: AC
  Administered 2012-11-22: 6 mg via INTRAVENOUS
  Filled 2012-11-22: qty 2

## 2012-11-22 MED ORDER — OXYCODONE-ACETAMINOPHEN 5-325 MG PO TABS: 1.0000 | ORAL_TABLET | ORAL | Status: DC | PRN

## 2012-11-22 NOTE — Progress Notes (Signed)
Correction in cm note account number is [id number] not [id number]

## 2012-11-22 NOTE — ED Provider Notes (Signed)
History     CSN: [id number]  Arrival date & time 11/22/12  1350   First MD Initiated Contact with Patient 11/22/12 1414      Chief Complaint  Patient presents with  . Back Pain     The history is provided by the patient.   patient reports worsening right flank pain over the past 4 days.  She's had nausea without vomiting.  She reports right-sided abdominal pain as well.  She describes burning with urination without frequency or hesitancy.  She's had no fevers or chills.  She reports her pain is moderate to severe at this time.  Her pain was intermittent initially but now is constant.  She has a history of fibromyalgia and sickle cell trait.  She is on oxycodone at home for pain and this has not helped her discomfort.  She has a history of cholecystectomy  Past Medical History  Diagnosis Date  . Fibromyalgia   . Sickle cell trait     Past Surgical History  Procedure Date  . Cholecystectomy     No family history on file.  History  Substance Use Topics  . Smoking status: Current Some Day Smoker  . Smokeless tobacco: Not on file  . Alcohol Use: Yes     Comment: occasionally    OB History    Grav Para Term Preterm Abortions TAB SAB Ect Mult Living                  Review of Systems  Musculoskeletal: Positive for back pain.  All other systems reviewed and are negative.    Allergies  Review of patient's allergies indicates no known allergies.  Home Medications   Current Outpatient Rx  Name  Route  Sig  Dispense  Refill  . ALPRAZOLAM 1 MG PO TABS   Oral   Take 1 mg by mouth 3 (three) times daily as needed. For anxiety.          . AMITRIPTYLINE HCL 25 MG PO TABS   Oral   Take 25-50 mg by mouth at bedtime as needed. For sleep.         Marland Kitchen VITAMIN D 1000 UNITS PO TABS   Oral   Take 1,000 Units by mouth daily.           . DULOXETINE HCL 20 MG PO CPEP   Oral   Take 20 mg by mouth daily.           Carma Leaven M PLUS PO TABS   Oral   Take 1 tablet by  mouth daily.           . OXYCODONE-ACETAMINOPHEN 10-325 MG PO TABS   Oral   Take 1 tablet by mouth every 4 (four) hours as needed. pain         . VITAMIN B-12 1000 MCG PO TABS   Oral   Take 1,000 mcg by mouth daily.           Marland Kitchen VITAMIN C 500 MG PO TABS   Oral   Take 500 mg by mouth daily.             BP 141/79  Pulse 91  Temp 98.3 F (36.8 C) (Oral)  Resp 16  SpO2 98%  Physical Exam  Nursing note and vitals reviewed. Constitutional: She is oriented to person, place, and time. She appears well-developed and well-nourished. No distress.  HENT:  Head: Normocephalic and atraumatic.  Eyes: EOM are normal.  Neck: Normal  range of motion.  Cardiovascular: Normal rate, regular rhythm and normal heart sounds.   Pulmonary/Chest: Effort normal and breath sounds normal.  Abdominal: Soft. She exhibits no distension.       Mild right-sided abdominal tenderness without guarding or rebound  Genitourinary:       Mild right CVA tenderness  Musculoskeletal: Normal range of motion.  Neurological: She is alert and oriented to person, place, and time.  Skin: Skin is warm and dry.  Psychiatric: She has a normal mood and affect. Judgment normal.    ED Course  Procedures (including critical care time)  Labs Reviewed  URINALYSIS, ROUTINE W REFLEX MICROSCOPIC - Abnormal; Notable for the following:    APPearance CLOUDY (*)     Hgb urine dipstick LARGE (*)     Ketones, ur >80 (*)     All other components within normal limits  URINE MICROSCOPIC-ADD ON - Abnormal; Notable for the following:    Squamous Epithelial / LPF MANY (*)     Bacteria, UA MANY (*)     All other components within normal limits  COMPREHENSIVE METABOLIC PANEL - Abnormal; Notable for the following:    Potassium 3.3 (*)     All other components within normal limits  LIPASE, BLOOD - Abnormal; Notable for the following:    Lipase 9 (*)     All other components within normal limits  CBC WITH DIFFERENTIAL    PREGNANCY, URINE  URINE CULTURE   Ct Abdomen Pelvis Wo Contrast  11/22/2012  *RADIOLOGY REPORT*  Clinical Data: Lower back/right flank pain, hematuria  CT ABDOMEN AND PELVIS WITHOUT CONTRAST  Technique:  Multidetector CT imaging of the abdomen and pelvis was performed following the standard protocol without intravenous contrast.  Comparison: 12/07/2008  Findings: Lung bases are essentially clear.  Unenhanced liver, spleen, pancreas, and adrenal glands within normal limits.  Status post cholecystectomy.  Common duct measures 8.5 mm, unchanged.  Multiple renal angiomyolipomas.  Dominant right renal lesion measures 1.5 x 1.7 cm (series 2/image 22), previously 1.5 x 1.6 cm. Dominant left renal lesion measures 1.6 x 2.0 cm (series 2/image 20), previously 1.4 x 1.7 cm.  No renal calculi or hydronephrosis.  No evidence of bowel obstruction.  No evidence of abdominal aortic aneurysm.  No abdominopelvic ascites.  No suspicious abdominopelvic lymphadenopathy.  2.1 x 2.6 x 2.0 cm lesion arising along the inferior aspect of the uterus and indenting the bladder dome (series 2/image 71), favored to reflect a uterine fibroid.  Bilateral ovaries are unremarkable.  No ureteral or bladder calculi.  Bladder is mildly thick-walled although underdistended.  Mild degenerative changes of the lower thoracic spine.  IMPRESSION: No renal, ureteral, or bladder calculi.  No hydronephrosis.  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  Bladder is mildly thick-walled, correlate for cystitis.  Suspected 2.6 cm exophytic uterine fibroid indenting the left bladder dome.  Consider pelvic ultrasound for initial evaluation as clinically warranted.   Original Report Authenticated By: Charline Bills, M.D.    I personally reviewed the imaging tests through PACS system I reviewed available ER/hospitalization records through the EMR   1. Pyelonephritis       MDM  4:23 PM The patient is  feeling better at this time.  Will treat for pyelonephritis.  CT scan without obstructing stone.  Patient be treated as an outpatient pain medicine, antibiotics, antinausea medicine patients has return to the ER for new or worsening symptoms  Lyanne Co, MD 11/22/12 [phone]

## 2012-11-22 NOTE — ED Notes (Signed)
IV team paged.  

## 2012-11-22 NOTE — ED Notes (Signed)
C/o lower back pain and flank pain for 4 days

## 2012-11-22 NOTE — Progress Notes (Signed)
CARE MANAGEMENT ED NOTE 11/22/2012  Patient:  Melissa Maynard,Melissa Maynard   Account Number:  [id number]  Date Initiated:  11/22/2012  Documentation initiated by:  Edd Arbour  Subjective/Objective Assessment:   41 yr old female bcbs c/o lower back pain abdominal pain      Subjective/Objective Assessment Detail:   no pcp listed pt states pcp is [facility] -dr Mackey Birchwood and others      Action/Plan:   Action/Plan Detail:   Anticipated DC Date:  11/22/2012     Status Recommendation to Physician:   Result of Recommendation:    Other ED Services  Consult Working Plan    DC Planning Services  Other  PCP issues    Choice offered to / List presented to:            Status of service:  Completed, signed off  ED Comments:   ED Comments Detail:

## 2012-11-22 NOTE — Progress Notes (Signed)
pcp is [facility] and other providers at [facility]

## 2012-11-24 LAB — URINE CULTURE

## 2012-11-25 ENCOUNTER — Emergency Department (HOSPITAL_COMMUNITY)
Admission: EM | Admit: 2012-11-25 | Discharge: 2012-11-25 | Disposition: A | Discharge status: Home / Self Care | Payer: Self-pay | Attending: Emergency Medicine | Admitting: Emergency Medicine

## 2012-11-25 DIAGNOSIS — D573 Sickle-cell trait: Secondary | ICD-10-CM | POA: Insufficient documentation

## 2012-11-25 DIAGNOSIS — R110 Nausea: Secondary | ICD-10-CM | POA: Insufficient documentation

## 2012-11-25 DIAGNOSIS — R6883 Chills (without fever): Secondary | ICD-10-CM | POA: Insufficient documentation

## 2012-11-25 DIAGNOSIS — N12 Tubulo-interstitial nephritis, not specified as acute or chronic: Secondary | ICD-10-CM

## 2012-11-25 DIAGNOSIS — M549 Dorsalgia, unspecified: Secondary | ICD-10-CM | POA: Insufficient documentation

## 2012-11-25 DIAGNOSIS — B029 Zoster without complications: Secondary | ICD-10-CM

## 2012-11-25 DIAGNOSIS — F419 Anxiety disorder, unspecified: Secondary | ICD-10-CM

## 2012-11-25 DIAGNOSIS — R509 Fever, unspecified: Secondary | ICD-10-CM | POA: Insufficient documentation

## 2012-11-25 DIAGNOSIS — L270 Generalized skin eruption due to drugs and medicaments taken internally: Secondary | ICD-10-CM

## 2012-11-25 DIAGNOSIS — R51 Headache: Secondary | ICD-10-CM | POA: Insufficient documentation

## 2012-11-25 DIAGNOSIS — F17200 Nicotine dependence, unspecified, uncomplicated: Secondary | ICD-10-CM | POA: Insufficient documentation

## 2012-11-25 DIAGNOSIS — Z79899 Other long term (current) drug therapy: Secondary | ICD-10-CM | POA: Insufficient documentation

## 2012-11-25 DIAGNOSIS — R599 Enlarged lymph nodes, unspecified: Secondary | ICD-10-CM | POA: Insufficient documentation

## 2012-11-25 DIAGNOSIS — F411 Generalized anxiety disorder: Secondary | ICD-10-CM | POA: Insufficient documentation

## 2012-11-25 DIAGNOSIS — R590 Localized enlarged lymph nodes: Secondary | ICD-10-CM

## 2012-11-25 MED ORDER — DIPHENHYDRAMINE HCL 25 MG PO CAPS
25.0000 mg | ORAL_CAPSULE | Freq: Once | ORAL | Status: AC
  Administered 2012-11-25: 25 mg via ORAL
  Filled 2012-11-25: qty 1

## 2012-11-25 MED ORDER — ACYCLOVIR 400 MG PO TABS: 800.0000 mg | ORAL_TABLET | Freq: Every day | ORAL | Status: DC

## 2012-11-25 MED ORDER — SULFAMETHOXAZOLE-TRIMETHOPRIM 800-160 MG PO TABS: 1.0000 | ORAL_TABLET | Freq: Two times a day (BID) | ORAL | Status: DC

## 2012-11-25 MED ORDER — LORAZEPAM 1 MG PO TABS
1.0000 mg | ORAL_TABLET | Freq: Once | ORAL | Status: AC
  Administered 2012-11-25: 1 mg via ORAL
  Filled 2012-11-25: qty 1

## 2012-11-25 MED ORDER — HYDROXYZINE HCL 25 MG PO TABS: 25.0000 mg | ORAL_TABLET | Freq: Four times a day (QID) | ORAL | Status: DC

## 2012-11-25 NOTE — ED Notes (Signed)
Pt states she was seen here on 11/29 and treated for kidney infection. Pt states she has a rash which started on 11/30. Pt states she has a rash on her buttocks, groin and back. Pt c/o nausea and headache. Pt states she started abx for infection on 11/29. Pt was given Keflex and Percocet on 11/29. Pt states she still has L flank pain from kidney infection. Pt speaks in complete sentences. No acute distress.

## 2012-11-25 NOTE — ED Notes (Signed)
Pt states she last took Percocet at 1400 today.

## 2012-11-25 NOTE — ED Provider Notes (Signed)
History     CSN: [id number]  Arrival date & time 11/25/12  1910   First MD Initiated Contact with Patient 11/25/12 2236      Chief Complaint  Patient presents with  . Rash    (Consider location/radiation/quality/duration/timing/severity/associated sxs/prior treatment) Patient is a 41 y.o. female presenting with rash. The history is provided by the patient and medical records.  Rash     Melissa Maynard is a 41 y.o. female  with a hx of fibromyalgia presents to the Emergency Department complaining of gradual, persistent, progressively worsening rash on her buttocks onset 2 days ago after 1 dose of the antibiotic. She has continued to take the antibiotic, Keflex.  Associated symptoms include itching around her neck, fever, headache. He states that rash on her back is painful, itching.  Nothing makes it better and laying down makes it worse.  Pt denies neck pain, chest pain, shortness of breath, swelling of her lips of throat, abdominal pain, nausea, vomiting, diarrhea, weakness, dizziness, syncope.   Patient also complaining of significant anxiety.   Past Medical History  Diagnosis Date  . Fibromyalgia   . Sickle cell trait     Past Surgical History  Procedure Date  . Cholecystectomy     No family history on file.  History  Substance Use Topics  . Smoking status: Current Some Day Smoker  . Smokeless tobacco: Not on file  . Alcohol Use: Yes     Comment: occasionally    OB History    Grav Para Term Preterm Abortions TAB SAB Ect Mult Living                  Review of Systems  Constitutional: Positive for fever and chills. Negative for diaphoresis, appetite change, fatigue and unexpected weight change.  HENT: Negative for mouth sores and neck stiffness.   Eyes: Negative for visual disturbance.  Respiratory: Negative for cough, chest tightness, shortness of breath and wheezing.   Cardiovascular: Negative for chest pain.  Gastrointestinal: Positive for nausea. Negative  for vomiting, abdominal pain, diarrhea and constipation.  Genitourinary: Negative for dysuria, urgency, frequency and hematuria.  Musculoskeletal: Positive for back pain and arthralgias.  Skin: Positive for rash. Negative for color change.  Neurological: Positive for headaches. Negative for syncope and light-headedness.  Psychiatric/Behavioral: Negative for sleep disturbance. The patient is not nervous/anxious.   All other systems reviewed and are negative.    Allergies  Review of patient's allergies indicates no known allergies.  Home Medications   Current Outpatient Rx  Name  Route  Sig  Dispense  Refill  . CEPHALEXIN 500 MG PO CAPS   Oral   Take 1 capsule (500 mg total) by mouth 4 (four) times daily.   28 capsule   0   . VITAMIN D 1000 UNITS PO TABS   Oral   Take 1,000 Units by mouth daily.           . DULOXETINE HCL 20 MG PO CPEP   Oral   Take 20 mg by mouth daily.           Carma Leaven M PLUS PO TABS   Oral   Take 1 tablet by mouth daily.           . OXYCODONE-ACETAMINOPHEN 5-325 MG PO TABS   Oral   Take 1 tablet by mouth every 4 (four) hours as needed for pain.   20 tablet   0   . VITAMIN C 500 MG PO TABS  Oral   Take 500 mg by mouth daily.           . ACYCLOVIR 400 MG PO TABS   Oral   Take 2 tablets (800 mg total) by mouth 5 (five) times daily.   25 tablet   0   . HYDROXYZINE HCL 25 MG PO TABS   Oral   Take 1 tablet (25 mg total) by mouth every 6 (six) hours.   12 tablet   0   . SULFAMETHOXAZOLE-TRIMETHOPRIM 800-160 MG PO TABS   Oral   Take 1 tablet by mouth every 12 (twelve) hours.   20 tablet   0     BP 130/79  Pulse 84  Temp 99.5 F (37.5 C) (Oral)  Resp 17  SpO2 100%  Physical Exam  Nursing note and vitals reviewed. Constitutional: She is oriented to person, place, and time. She appears well-developed and well-nourished. No distress.  HENT:  Head: Normocephalic and atraumatic.  Right Ear: Tympanic membrane, external ear and  ear canal normal.  Left Ear: Tympanic membrane, external ear and ear canal normal.  Nose: Nose normal. Right sinus exhibits no maxillary sinus tenderness and no frontal sinus tenderness. Left sinus exhibits no maxillary sinus tenderness and no frontal sinus tenderness.  Mouth/Throat: Uvula is midline, oropharynx is clear and moist and mucous membranes are normal. No oropharyngeal exudate.  Eyes: Conjunctivae normal and EOM are normal. Pupils are equal, round, and reactive to light. No scleral icterus.  Neck: Normal range of motion and full passive range of motion without pain. Neck supple. No spinous process tenderness and no muscular tenderness present. No Brudzinski's sign and no Kernig's sign noted.  Cardiovascular: Normal rate, regular rhythm, normal heart sounds and intact distal pulses.  Exam reveals no gallop and no friction rub.   No murmur heard. Pulmonary/Chest: Effort normal and breath sounds normal. No respiratory distress. She has no wheezes. She has no rales. She exhibits no tenderness.  Abdominal: Soft. Bowel sounds are normal. She exhibits no distension and no mass. There is no tenderness. There is CVA tenderness (left). There is no rebound and no guarding.  Musculoskeletal: Normal range of motion. She exhibits no edema.  Lymphadenopathy:    She has no cervical adenopathy.       Right: Inguinal adenopathy present.       Left: Inguinal adenopathy present.       Tender lymphadenopathy right and left inguinal area  Neurological: She is alert and oriented to person, place, and time. She exhibits normal muscle tone. Coordination normal.       Speech is clear and goal oriented Moves extremities without ataxia  Skin: Skin is warm and dry. Rash noted. Rash is vesicular. She is not diaphoretic. There is erythema.          Erythematous, vesicular tender rash originating at the gluteal cleft and extending around the right buttock in a dermatomal fashion  No obvious rash around the chest,  abdomen, neck, back, face - patient states these, but small red bumps are itchy they resolve on their own  Psychiatric: She has a normal mood and affect.    ED Course  Procedures (including critical care time)  Labs Reviewed - No data to display No results found.   1. Lymphadenopathy, inguinal   2. Shingles   3. Pyelonephritis   4. Anxiety   5. Allergic drug rash       MDM  ARELIE KUZEL presents with rash the first of which is  shingles based on physical exam. No evidence of meningeal signs, rash not purpuric or petechiae. The second rash is likely secondary to her Keflex prescription, though there is no rash present on physical exam. Will change Keflex to Bactrim to treat her pyelonephritis. The symptoms are likely to lead to her persistence pyelonephritis.   No lymphadenopathy in the bilateral inguinal regions of unknown origin.  I suggested that she followup with GYN to have that further evaluated. Will give Atarax for itching.  Patient has Percocet for pain control.  1. Medications: Bactrim, Atarax, acyclovir, usual home medications 2. Treatment: rest, drink plenty of fluids, medications as prescribed, take Tylenol or ibuprofen for pain and fever control 3. Follow Up: Please followup with your primary doctor for discussion of your diagnoses and further evaluation after today's visit; if you do not have a primary care doctor use the resource guide provided to find one; also followup with your GYN        Dierdre Forth, PA-C 11/25/12 2333

## 2012-11-27 NOTE — ED Provider Notes (Signed)
Medical screening examination/treatment/procedure(s) were performed by non-physician practitioner and as supervising physician I was immediately available for consultation/collaboration.   Joya Gaskins, MD 11/27/12 [phone]

## 2014-01-05 IMAGING — CT CT ABD-PELV W/O CM
1 series · 14 of 18 positions shown, 19 images · non-contrast
Comparison: 12/07/2008

CLINICAL DATA: Lower back/right flank pain, hematuria

CT ABDOMEN AND PELVIS WITHOUT CONTRAST
TECHNIQUE: Multidetector CT imaging of the abdomen and pelvis was
performed following the standard protocol without intravenous
contrast.

[Series 6: lung · axial · 0.79mm/px · z∈[+1695,+1770]mm · 14 of 18 slices shown, 19 images]
[im 2/18  soft-tissue]
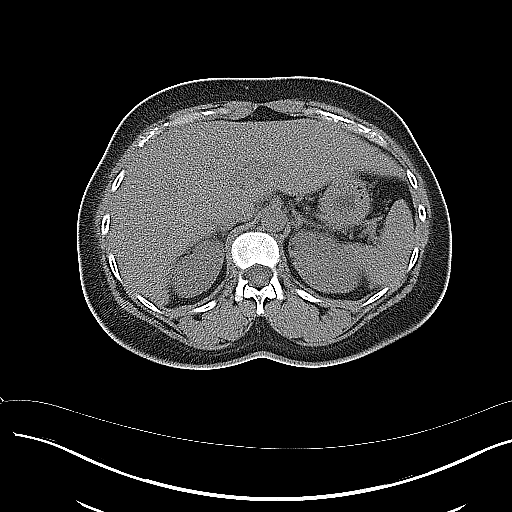
[im 2/18  bone]
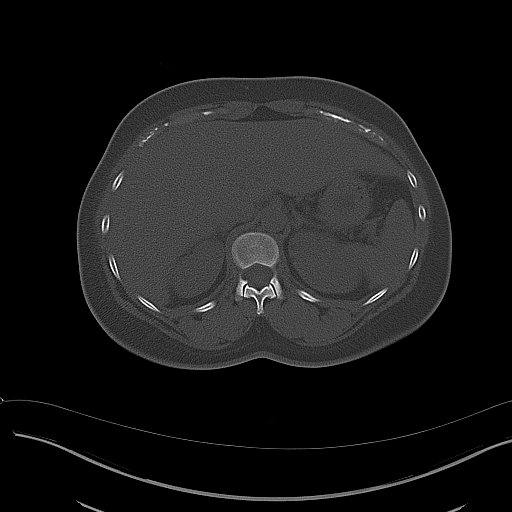
[im 3/18  soft-tissue]
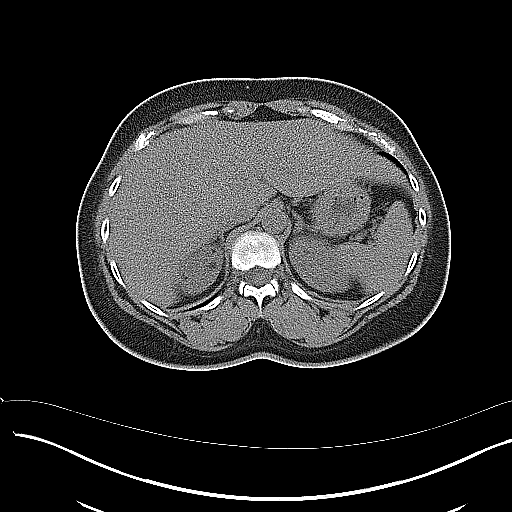
[im 5/18  soft-tissue]
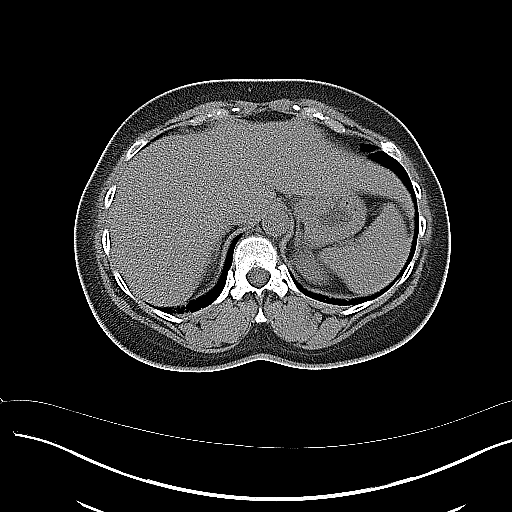
[im 6/18  soft-tissue]
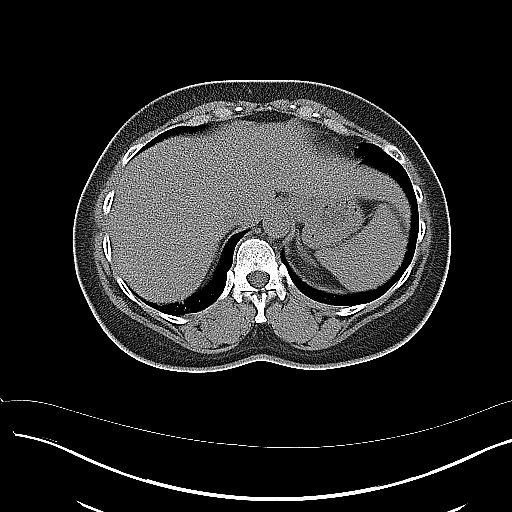
[im 7/18  soft-tissue]
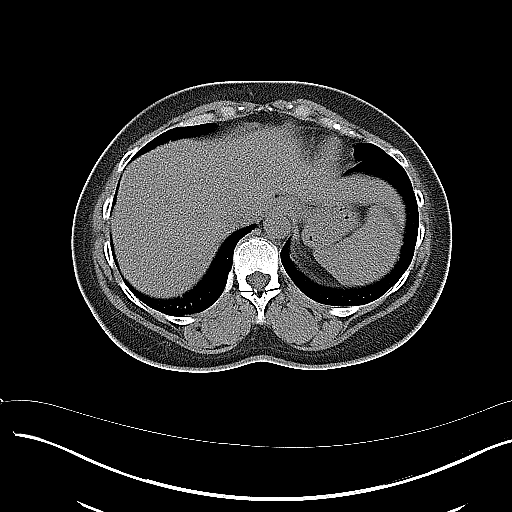
[im 8/18  soft-tissue]
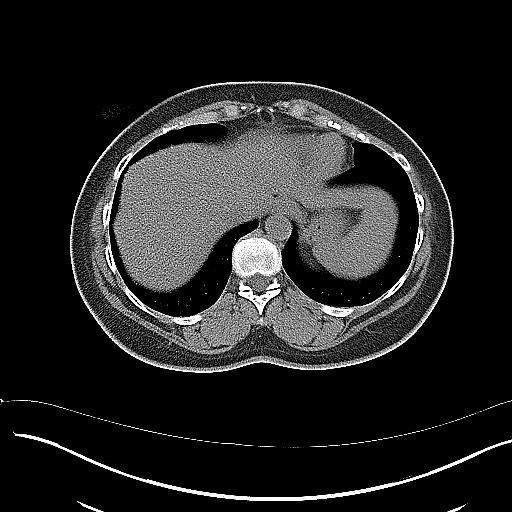
[im 10/18  soft-tissue]
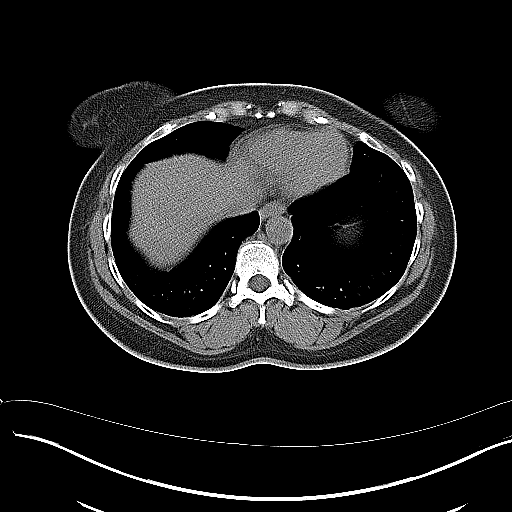
[im 11/18  soft-tissue]
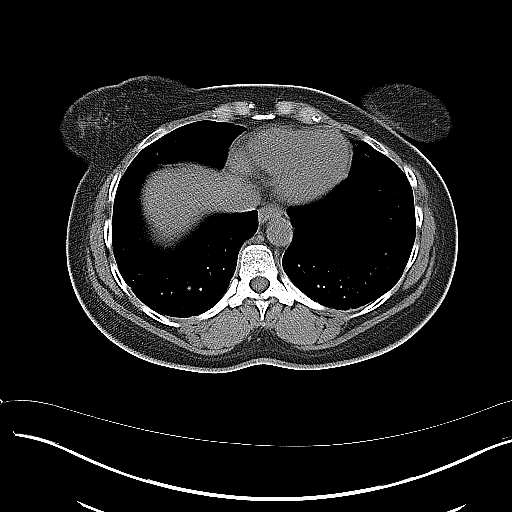
[im 12/18  soft-tissue]
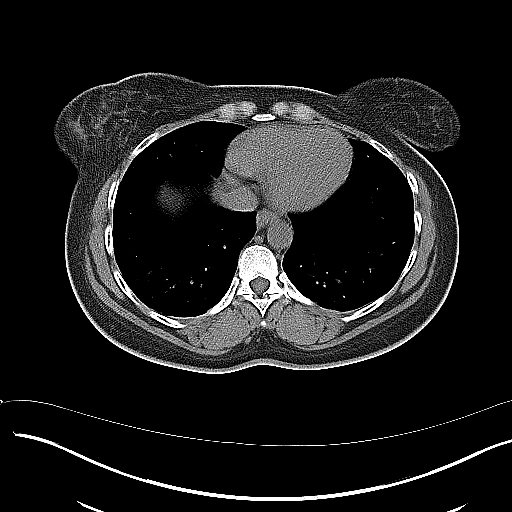
[im 12/18  bone]
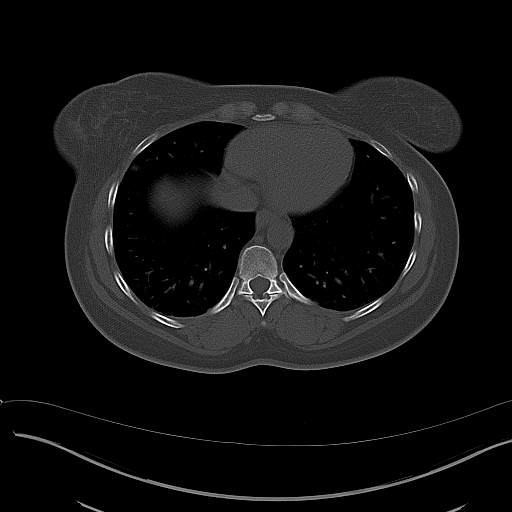
[im 13/18  soft-tissue]
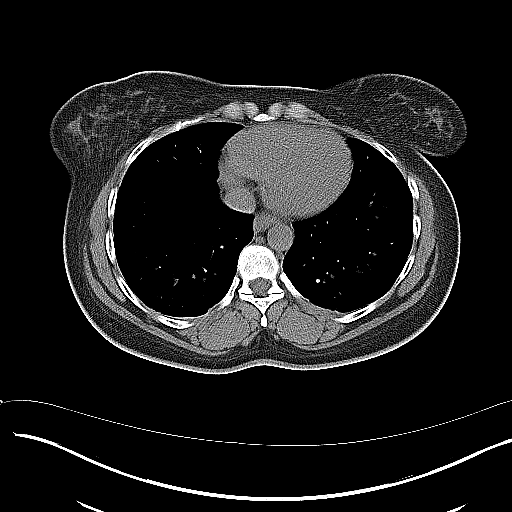
[im 14/18  soft-tissue]
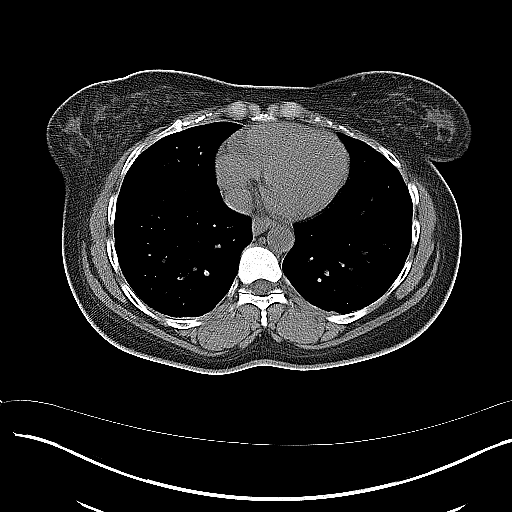
[im 14/18  lung]
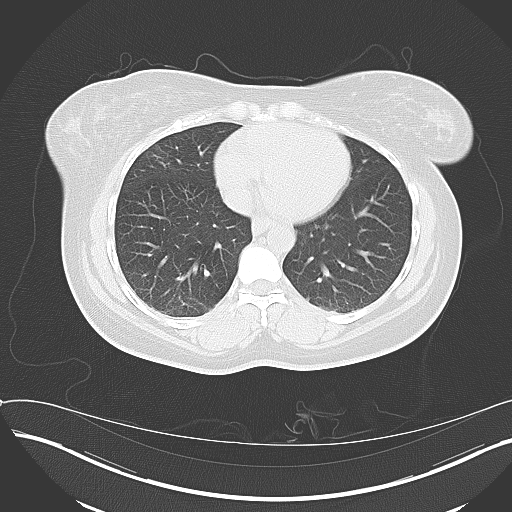
[im 15/18  lung]
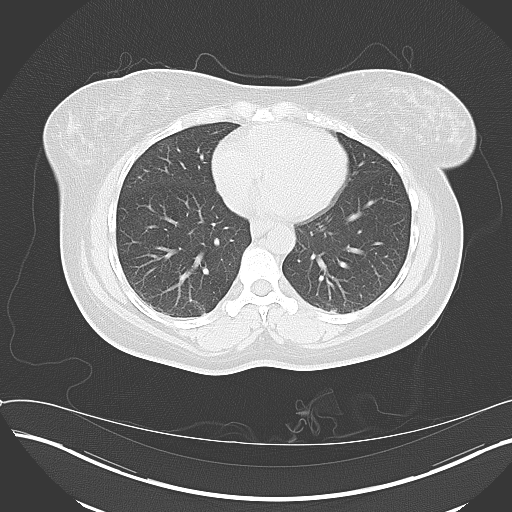
[im 16/18  soft-tissue]
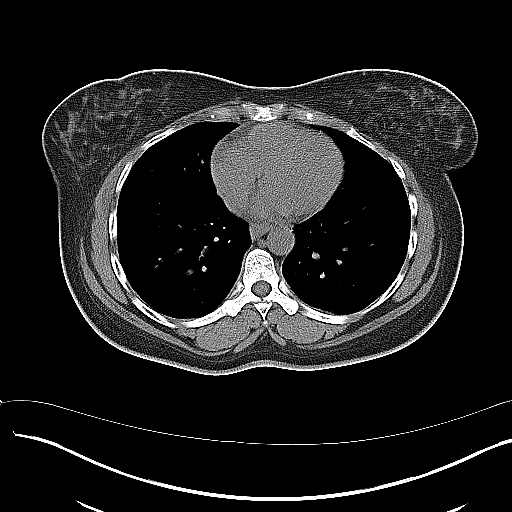
[im 16/18  lung]
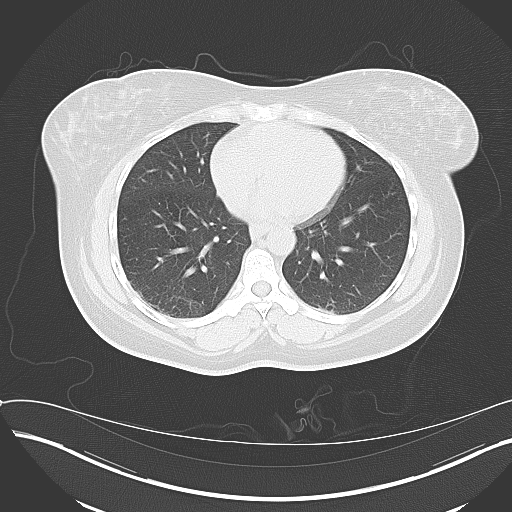
[im 17/18  soft-tissue]
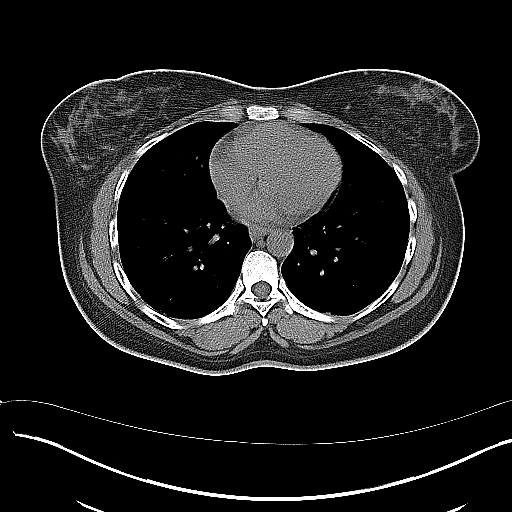
[im 17/18  lung]
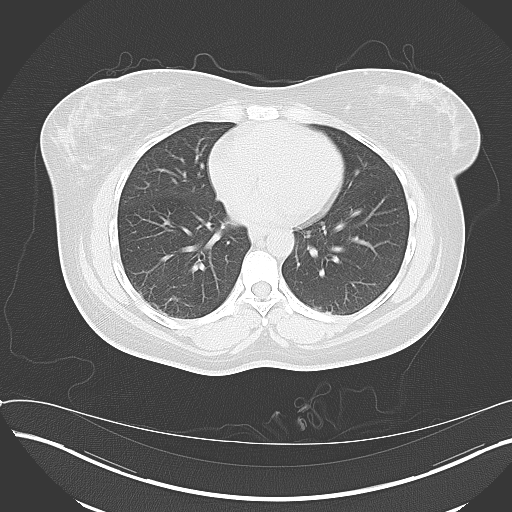

[14 of 18 positions shown; findings below may reference images not displayed]

FINDINGS: Lung bases are essentially clear.

Unenhanced liver, spleen, pancreas, and adrenal glands within
normal limits.

Status post cholecystectomy.  Common duct measures 8.5 mm,
unchanged.

Multiple renal angiomyolipomas.  Dominant right renal lesion
measures 1.5 x 1.7 cm (series 2/image 22), previously 1.5 x 1.6 cm.
Dominant left renal lesion measures 1.6 x 2.0 cm (series 2/image
20), previously 1.4 x 1.7 cm.  No renal calculi or hydronephrosis.

No evidence of bowel obstruction.

No evidence of abdominal aortic aneurysm.

No abdominopelvic ascites.

No suspicious abdominopelvic lymphadenopathy.

2.1 x 2.6 x 2.0 cm lesion arising along the inferior aspect of the
uterus and indenting the bladder dome (series 2/image 71), favored
to reflect a uterine fibroid.  Bilateral ovaries are unremarkable.

No ureteral or bladder calculi.  Bladder is mildly thick-walled
although underdistended.

Mild degenerative changes of the lower thoracic spine.
IMPRESSION: No renal, ureteral, or bladder calculi.  No hydronephrosis.

Multiple bilateral renal angiomyolipomas, a few of which have
mildly increased since 1552.

Bladder is mildly thick-walled, correlate for cystitis.

Suspected 2.6 cm exophytic uterine fibroid indenting the left
bladder dome.  Consider pelvic ultrasound for initial evaluation as
clinically warranted.

## 2014-03-02 ENCOUNTER — Encounter (HOSPITAL_COMMUNITY): Payer: Self-pay | Admitting: *Deleted

## 2014-03-02 ENCOUNTER — Encounter (HOSPITAL_COMMUNITY): Payer: Self-pay | Admitting: Emergency Medicine

## 2014-03-02 ENCOUNTER — Emergency Department (HOSPITAL_COMMUNITY)
Admission: EM | Admit: 2014-03-02 | Discharge: 2014-03-02 | Disposition: A | Discharge status: Other Acute Inpatient Hospital | Payer: BC Managed Care – PPO | Attending: Emergency Medicine | Admitting: Emergency Medicine

## 2014-03-02 ENCOUNTER — Inpatient Hospital Stay (HOSPITAL_COMMUNITY)
Admission: RE | Admit: 2014-03-02 | Discharge: 2014-03-06 | DRG: 885 | Disposition: A | Discharge status: Home / Self Care | Payer: Federal, State, Local not specified - Other | Attending: Psychiatry | Admitting: Psychiatry

## 2014-03-02 DIAGNOSIS — R5383 Other fatigue: Secondary | ICD-10-CM

## 2014-03-02 DIAGNOSIS — F17200 Nicotine dependence, unspecified, uncomplicated: Secondary | ICD-10-CM | POA: Diagnosis present

## 2014-03-02 DIAGNOSIS — G4700 Insomnia, unspecified: Secondary | ICD-10-CM | POA: Diagnosis present

## 2014-03-02 DIAGNOSIS — F411 Generalized anxiety disorder: Secondary | ICD-10-CM | POA: Diagnosis present

## 2014-03-02 DIAGNOSIS — F331 Major depressive disorder, recurrent, moderate: Principal | ICD-10-CM | POA: Diagnosis present

## 2014-03-02 DIAGNOSIS — R5381 Other malaise: Secondary | ICD-10-CM | POA: Insufficient documentation

## 2014-03-02 DIAGNOSIS — IMO0001 Reserved for inherently not codable concepts without codable children: Secondary | ICD-10-CM | POA: Insufficient documentation

## 2014-03-02 DIAGNOSIS — F329 Major depressive disorder, single episode, unspecified: Secondary | ICD-10-CM | POA: Diagnosis present

## 2014-03-02 DIAGNOSIS — F410 Panic disorder [episodic paroxysmal anxiety] without agoraphobia: Secondary | ICD-10-CM | POA: Diagnosis present

## 2014-03-02 DIAGNOSIS — R450 Nervousness: Secondary | ICD-10-CM | POA: Insufficient documentation

## 2014-03-02 DIAGNOSIS — Z87820 Personal history of traumatic brain injury: Secondary | ICD-10-CM

## 2014-03-02 DIAGNOSIS — Z79899 Other long term (current) drug therapy: Secondary | ICD-10-CM | POA: Insufficient documentation

## 2014-03-02 DIAGNOSIS — Z862 Personal history of diseases of the blood and blood-forming organs and certain disorders involving the immune mechanism: Secondary | ICD-10-CM | POA: Insufficient documentation

## 2014-03-02 DIAGNOSIS — F4310 Post-traumatic stress disorder, unspecified: Secondary | ICD-10-CM | POA: Diagnosis present

## 2014-03-02 DIAGNOSIS — G479 Sleep disorder, unspecified: Secondary | ICD-10-CM | POA: Insufficient documentation

## 2014-03-02 DIAGNOSIS — F39 Unspecified mood [affective] disorder: Secondary | ICD-10-CM | POA: Diagnosis present

## 2014-03-02 DIAGNOSIS — Z792 Long term (current) use of antibiotics: Secondary | ICD-10-CM | POA: Insufficient documentation

## 2014-03-02 DIAGNOSIS — R630 Anorexia: Secondary | ICD-10-CM | POA: Insufficient documentation

## 2014-03-02 DIAGNOSIS — D573 Sickle-cell trait: Secondary | ICD-10-CM | POA: Diagnosis present

## 2014-03-02 HISTORY — DX: Bursopathy, unspecified: M71.9

## 2014-03-02 LAB — COMPREHENSIVE METABOLIC PANEL
ALK PHOS: 61 U/L (ref 39–117)
ALT: 19 U/L (ref 0–35)
AST: 15 U/L (ref 0–37)
Albumin: 3.8 g/dL (ref 3.5–5.2)
BILIRUBIN TOTAL: 0.3 mg/dL (ref 0.3–1.2)
BUN: 12 mg/dL (ref 6–23)
CHLORIDE: 104 meq/L (ref 96–112)
CO2: 25 meq/L (ref 19–32)
Calcium: 9.6 mg/dL (ref 8.4–10.5)
Creatinine, Ser: 0.63 mg/dL (ref 0.50–1.10)
GLUCOSE: 105 mg/dL — AB (ref 70–99)
Potassium: 3.7 mEq/L (ref 3.7–5.3)
SODIUM: 143 meq/L (ref 137–147)
Total Protein: 7 g/dL (ref 6.0–8.3)

## 2014-03-02 LAB — RAPID URINE DRUG SCREEN, HOSP PERFORMED
Amphetamines: NOT DETECTED
BARBITURATES: NOT DETECTED
Benzodiazepines: NOT DETECTED
Cocaine: NOT DETECTED
Opiates: NOT DETECTED
TETRAHYDROCANNABINOL: NOT DETECTED

## 2014-03-02 LAB — ETHANOL: Alcohol, Ethyl (B): <11 mg/dL (ref 0–11)

## 2014-03-02 LAB — CBC
HCT: 36.7 % (ref 36.0–46.0)
HEMOGLOBIN: 13.1 g/dL (ref 12.0–15.0)
MCH: 29.4 pg (ref 26.0–34.0)
MCHC: 35.7 g/dL (ref 30.0–36.0)
MCV: 82.5 fL (ref 78.0–100.0)
PLATELETS: 313 10*3/uL (ref 150–400)
RBC: 4.45 MIL/uL (ref 3.87–5.11)
RDW: 13.3 % (ref 11.5–15.5)
WBC: 8.6 10*3/uL (ref 4.0–10.5)

## 2014-03-02 MED ORDER — ALUM & MAG HYDROXIDE-SIMETH 200-200-20 MG/5ML PO SUSP: 30.0000 mL | ORAL | Status: DC | PRN

## 2014-03-02 MED ORDER — INFLUENZA VAC SPLIT QUAD 0.5 ML IM SUSP
0.5000 mL | INTRAMUSCULAR | Status: DC
  Filled 2014-03-02: qty 0.5

## 2014-03-02 MED ORDER — HYDROXYZINE HCL 25 MG PO TABS
25.0000 mg | ORAL_TABLET | Freq: Four times a day (QID) | ORAL | Status: DC | PRN
  Administered 2014-03-02 – 2014-03-03 (×4): 25 mg via ORAL
  Filled 2014-03-02 (×4): qty 1

## 2014-03-02 MED ORDER — LORAZEPAM 1 MG PO TABS
1.0000 mg | ORAL_TABLET | Freq: Three times a day (TID) | ORAL | Status: DC | PRN
  Administered 2014-03-02: 1 mg via ORAL
  Filled 2014-03-02: qty 1

## 2014-03-02 MED ORDER — IBUPROFEN 200 MG PO TABS: 600.0000 mg | ORAL_TABLET | Freq: Three times a day (TID) | ORAL | Status: DC | PRN

## 2014-03-02 MED ORDER — POTASSIUM CHLORIDE CRYS ER 20 MEQ PO TBCR
20.0000 meq | EXTENDED_RELEASE_TABLET | Freq: Two times a day (BID) | ORAL | Status: AC
  Administered 2014-03-02 – 2014-03-04 (×4): 20 meq via ORAL
  Filled 2014-03-02: qty 2
  Filled 2014-03-02 (×5): qty 1

## 2014-03-02 MED ORDER — NICOTINE 21 MG/24HR TD PT24
21.0000 mg | MEDICATED_PATCH | Freq: Every day | TRANSDERMAL | Status: DC
  Administered 2014-03-02: 21 mg via TRANSDERMAL
  Filled 2014-03-02: qty 1

## 2014-03-02 MED ORDER — MAGNESIUM HYDROXIDE 400 MG/5ML PO SUSP: 30.0000 mL | Freq: Every day | ORAL | Status: DC | PRN

## 2014-03-02 MED ORDER — PNEUMOCOCCAL VAC POLYVALENT 25 MCG/0.5ML IJ INJ: 0.5000 mL | INJECTION | INTRAMUSCULAR | Status: DC

## 2014-03-02 MED ORDER — ZOLPIDEM TARTRATE 5 MG PO TABS: 5.0000 mg | ORAL_TABLET | Freq: Every evening | ORAL | Status: DC | PRN

## 2014-03-02 MED ORDER — ACETAMINOPHEN 325 MG PO TABS
650.0000 mg | ORAL_TABLET | Freq: Four times a day (QID) | ORAL | Status: DC | PRN
Start: 2014-03-02 — End: 2014-03-06
  Administered 2014-03-02 – 2014-03-05 (×2): 650 mg via ORAL
  Filled 2014-03-02 (×2): qty 2

## 2014-03-02 MED ORDER — ONDANSETRON HCL 4 MG PO TABS: 4.0000 mg | ORAL_TABLET | Freq: Three times a day (TID) | ORAL | Status: DC | PRN

## 2014-03-02 MED ORDER — ACETAMINOPHEN 325 MG PO TABS
650.0000 mg | ORAL_TABLET | ORAL | Status: DC | PRN
  Administered 2014-03-02: 650 mg via ORAL
  Filled 2014-03-02: qty 2

## 2014-03-02 MED ORDER — TRAZODONE HCL 50 MG PO TABS
50.0000 mg | ORAL_TABLET | Freq: Every evening | ORAL | Status: DC | PRN
Start: 2014-03-02 — End: 2014-03-04
  Administered 2014-03-02 – 2014-03-03 (×4): 50 mg via ORAL
  Filled 2014-03-02 (×10): qty 1

## 2014-03-02 NOTE — Progress Notes (Signed)
Didn't attend group 

## 2014-03-02 NOTE — Progress Notes (Signed)
[facility] provided pt with a [facility] Orange Card application, highlighting [facility], to help patient establish primary care.  °

## 2014-03-02 NOTE — ED Notes (Signed)
No acute distress noted.
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
## 2014-03-02 NOTE — ED Notes (Signed)
Bed: [facility] Expected date:  Expected time:  Means of arrival:  Comments: 
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
## 2014-03-02 NOTE — ED Notes (Signed)
[facility] alert[facility], per staff, pt has bed available, pt c/o depression, denies SI/HI

## 2014-03-02 NOTE — Progress Notes (Signed)
[facility].  [facility] reports being above capacity. Disposition search continues.

## 2014-03-02 NOTE — ED Notes (Signed)
[facility], assessment note in chart.

## 2014-03-02 NOTE — Tx Team (Signed)
Initial Interdisciplinary Treatment Plan  PATIENT STRENGTHS: (choose at least two) Ability for insight Average or above average intelligence Capable of independent living Communication skills General fund of knowledge Motivation for treatment/growth Physical Health Special hobby/interest Supportive family/friends Work skills  PATIENT STRESSORS: Financial difficulties Marital or family conflict Medication change or noncompliance Occupational concerns mom and aunt are both ill.   PROBLEM LIST: Problem List/Patient Goals Date to be addressed Date deferred Reason deferred Estimated date of resolution  "Try to get back on some type of meds" 03/02/14     "Maybe get some counseling or things like that" 03/02/14           Depression 03/02/14     Increased risk for suicide 03/02/14                              DISCHARGE CRITERIA:  Ability to meet basic life and health needs Adequate post-discharge living arrangements Improved stabilization in mood, thinking, and/or behavior Medical problems require only outpatient monitoring Motivation to continue treatment in a less acute level of care Need for constant or close observation no longer present Reduction of life-threatening or endangering symptoms to within safe limits Safe-care adequate arrangements made Verbal commitment to aftercare and medication compliance Withdrawal symptoms are absent or subacute and managed without 24-hour nursing intervention  PRELIMINARY DISCHARGE PLAN: Outpatient therapy Participate in family therapy Return to previous living arrangement  PATIENT/FAMIILY INVOLVEMENT: This treatment plan has been presented to and reviewed with the patient, Melissa FolksSharon N Buckholtz, and/or family member. The patient and family have been given the opportunity to ask questions and make suggestions.  [name], Emrey Thornley [facility] 03/02/2014, 9:36 PM

## 2014-03-02 NOTE — Progress Notes (Signed)
Writer informed Melissa Maynard that [facility].

## 2014-03-02 NOTE — ED Provider Notes (Signed)
CSN: [id number]     Arrival date & time 03/02/14  1245 History  This chart was scribed for Mora Bellman, PA-C, working with Gerhard Munch, MD, by Ellin Mayhew, ED Scribe. This patient was seen in room WTR4/WLPT4 and the patient's care was started at 2:39 PM.   Chief Complaint  Patient presents with  . Medical Clearance    From [facility], has bed available...   The history is provided by the patient. No language interpreter was used.   HPI Comments: Melissa Maynard is a 43 y.o. female who presents to the Emergency Department from [facility] for medical screening who was referred by staff. She states she has been having symptoms of depression, which include, fatigue, lack of motivation, inability to sleep, dysphoric mood, lack of appetite, for several months that has progressively worsened. Patient denies any SI/HI or hallucinations. She denies any drug/alcohol abuse. She states she is currently anxious. Patient states she stopped taking medications one year ago because she "wanted to see if she would no longer need them." As per [facility] staff, patient has a bed available for her at [facility].  Past Medical History  Diagnosis Date  . Fibromyalgia   . Sickle cell trait    Past Surgical History  Procedure Laterality Date  . Cholecystectomy     No family history on file. History  Substance Use Topics  . Smoking status: Current Some Day Smoker  . Smokeless tobacco: Not on file  . Alcohol Use: Yes     Comment: occasionally   OB History   Grav Para Term Preterm Abortions TAB SAB Ect Mult Living                 Review of Systems  Constitutional: Positive for activity change, appetite change and fatigue. Negative for fever and chills.  Respiratory: Negative for shortness of breath.   Gastrointestinal: Negative for nausea, vomiting and diarrhea.  Neurological: Negative for dizziness, weakness, light-headedness and headaches.  Psychiatric/Behavioral: Positive for sleep disturbance and dysphoric  mood. Negative for suicidal ideas, hallucinations and self-injury. The patient is nervous/anxious.   All other systems reviewed and are negative.   Allergies  Review of patient's allergies indicates no known allergies.  Home Medications   Current Outpatient Rx  Name  Route  Sig  Dispense  Refill  . acyclovir (ZOVIRAX) 400 MG tablet   Oral   Take 2 tablets (800 mg total) by mouth 5 (five) times daily.   25 tablet   0   . cephALEXin (KEFLEX) 500 MG capsule   Oral   Take 1 capsule (500 mg total) by mouth 4 (four) times daily.   28 capsule   0   . cholecalciferol (VITAMIN D) 1000 UNITS tablet   Oral   Take 1,000 Units by mouth daily.           . DULoxetine (CYMBALTA) 20 MG capsule   Oral   Take 20 mg by mouth daily.           . hydrOXYzine (ATARAX/VISTARIL) 25 MG tablet   Oral   Take 1 tablet (25 mg total) by mouth every 6 (six) hours.   12 tablet   0   . Multiple Vitamins-Minerals (MULTIVITAMINS THER. W/MINERALS) TABS   Oral   Take 1 tablet by mouth daily.           Marland Kitchen oxyCODONE-acetaminophen (PERCOCET/ROXICET) 5-325 MG per tablet   Oral   Take 1 tablet by mouth every 4 (four) hours as  needed for pain.   20 tablet   0   . sulfamethoxazole-trimethoprim (SEPTRA DS) 800-160 MG per tablet   Oral   Take 1 tablet by mouth every 12 (twelve) hours.   20 tablet   0   . vitamin C (ASCORBIC ACID) 500 MG tablet   Oral   Take 500 mg by mouth daily.            Triage Vitals: BP 138/74  Pulse 89  Temp(Src) 98 F (36.7 C) (Oral)  Resp 16  Wt 130 lb (58.968 kg)  SpO2 99%  Physical Exam  Nursing note and vitals reviewed. Constitutional: She is oriented to person, place, and time. She appears well-developed and well-nourished. No distress.  HENT:  Head: Normocephalic and atraumatic.  Right Ear: External ear normal.  Left Ear: External ear normal.  Nose: Nose normal.  Mouth/Throat: Oropharynx is clear and moist.  Eyes: Conjunctivae and EOM are normal.   Neck: Normal range of motion. Neck supple.  Cardiovascular: Normal rate, regular rhythm, normal heart sounds and intact distal pulses.   Pulmonary/Chest: Effort normal and breath sounds normal. No stridor. No respiratory distress. She has no wheezes. She has no rales.  Abdominal: Soft. She exhibits no distension.  Musculoskeletal: Normal range of motion.  Neurological: She is alert and oriented to person, place, and time. She has normal strength.  Skin: Skin is warm and dry. She is not diaphoretic. No erythema.  Psychiatric: She is withdrawn. She exhibits a depressed mood. She expresses no homicidal and no suicidal ideation. She expresses no suicidal plans and no homicidal plans.  Flat affect   ED Course  Procedures (including critical care time)  DIAGNOSTIC STUDIES: Oxygen Saturation is 99% on room air, normal by my interpretation.    COORDINATION OF CARE: 2:48 PM-Treatment plan discussed with patient and patient agrees.  Labs Review Labs Reviewed  COMPREHENSIVE METABOLIC PANEL - Abnormal; Notable for the following:    Glucose, Bld 105 (*)    All other components within normal limits  CBC  ETHANOL  URINE RAPID DRUG SCREEN (HOSP PERFORMED)   Imaging Review No results found.   EKG Interpretation None      MDM   Final diagnoses:  Major depression   Patient presents to the ED for medical clearance. [facility] due to vague SI. Patient is medically cleared and [facility]. Vital signs stable.   I personally performed the services described in this documentation, which was scribed in my presence. The recorded information has been reviewed and is accurate.    Mora BellmanHannah S Braidyn Peace, PA-C 03/02/14 2025

## 2014-03-03 DIAGNOSIS — F39 Unspecified mood [affective] disorder: Secondary | ICD-10-CM

## 2014-03-03 MED ORDER — IBUPROFEN 800 MG PO TABS
800.0000 mg | ORAL_TABLET | Freq: Four times a day (QID) | ORAL | Status: DC | PRN
  Administered 2014-03-03 (×2): 800 mg via ORAL
  Filled 2014-03-03 (×3): qty 1

## 2014-03-03 MED ORDER — DULOXETINE HCL 30 MG PO CPEP
30.0000 mg | ORAL_CAPSULE | Freq: Every day | ORAL | Status: DC
  Administered 2014-03-03 – 2014-03-04 (×2): 30 mg via ORAL
  Filled 2014-03-03 (×6): qty 1

## 2014-03-03 MED ORDER — ENSURE COMPLETE PO LIQD
237.0000 mL | Freq: Two times a day (BID) | ORAL | Status: DC
  Administered 2014-03-03 – 2014-03-06 (×5): 237 mL via ORAL

## 2014-03-03 NOTE — Progress Notes (Signed)
Recreation Therapy Notes   Animal-Assisted Activity/Therapy (AAA/T) Program Checklist/Progress Notes Patient Eligibility Criteria Checklist & Daily Group note for Rec Tx Intervention  Date: 03.10.2015 Time: 2:45pm [address]    AAA/T Program Assumption of Risk Form signed by Patient/ or Parent Legal Guardian yes  Patient is free of allergies or sever asthma yes  Patient reports no fear of animals yes  Patient reports no history of cruelty to animals yes   Patient understands his/her participation is voluntary yes  Behavioral Response: Did not attend.    Marykay Lexenise L Aaryan Essman, LRT/CTRS  Calel Pisarski L 03/03/2014 4:07 PM

## 2014-03-03 NOTE — Progress Notes (Signed)
Patient ID: Matthew FolksSharon N Maynard, female   DOB: 1971/04/14, 43 y.o.   MRN: [id number]  Pt is pleasant but flat and apprehensive during the adm process. Stated that both her mother and aunt have medical conditions that require care, and its causing increased stress and depression. Writer repeated the info stating it was the reason for pt's increased depression and anxiety; pt stated, "well there's some other things going on." Stated that she's noticed clothes missing as well as money. Stated, "there's something to do with my car too". However, pt wouldn't go into any detail. Pt stated she was on elavil, and cymbalta, but quit her job last when when she was diagnosed with fibromyalgia. Stated she hasn't been able to afford her meds and has been off of them over a year. Pt states she recently lost over 20 lbs. According to pt she only drinks occasionally and sometimes goes more than a month without drinking.
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
## 2014-03-03 NOTE — Progress Notes (Signed)
D: Patient denies SI/HI and auditory and visual hallucinations. The patient has a depressed mood and affect. The patient is attending groups and interacting appropriately within the milieu. The patient states that she is feeling depressed today but doesn't elaborate on the causes for her depression.  A: Patient given emotional support from RN. Patient encouraged to come to staff with concerns and/or questions. Patient's medication routine continued. Patient's orders and plan of care reviewed.  R: Patient remains appropriate and cooperative. Will continue to monitor patient q15 minutes for safety.

## 2014-03-03 NOTE — [facility] Group Notes (Signed)
[facility] LCSW Group Therapy  03/03/2014 2:26 PM  Type of Therapy:  Group Therapy  Participation Level:  Minimal  Participation Quality:  Drowsy  Affect:  Depressed, Flat and Lethargic  Cognitive:  Lacking  Insight:  Limited  Engagement in Therapy:  Limited  Modes of Intervention:  Discussion, Education, Exploration, Problem-solving, Rapport Building, Socialization and Support  Summary of Progress/Problems: [facility] Speaker came to talk about his personal journey with substance abuse and addiction. The pt processed ways by which to relate to the speaker. MHA speaker provided handouts and educational information pertaining to groups and services offered by the [facility]. Melissa Maynard was lethargic during group and appeared depressed with flat affect. She had difficulty remaining awake during the speaker's presentation. Melissa Maynard slept through the majority of group but woke up during the last few minutes. She thanked the speaker for coming. At this time, Melissa Maynard appears to be making limited progress in the group setting due to drowsiness.    Smart, Herbert Marken LCSWA  03/03/2014, 2:26 PM

## 2014-03-03 NOTE — ED Provider Notes (Signed)
Medical screening examination/treatment/procedure(s) were performed by non-physician practitioner and as supervising physician I was immediately available for consultation/collaboration.  Gerhard Munchobert Avina Eberle, MD 03/03/14 [phone]

## 2014-03-03 NOTE — Progress Notes (Signed)
NUTRITION ASSESSMENT  Pt meets criteria for severe MALNUTRITION in the context of social/environmental circumstances as evidenced by <50% estimated energy intake with 13% weight loss in the past month per pt report.  Pt identified as at risk on the Malnutrition Screen Tool  INTERVENTION: 1. Educated patient on the importance of nutrition and encouraged intake of food and beverages. 2. Encouraged pt to consume >50% of meals and 100% of snacks 3. Supplements: Ensure Complete BID  NUTRITION DIAGNOSIS: Unintentional weight loss related to sub-optimal intake as evidenced by pt report.   Goal: Pt to meet >/= 90% of their estimated nutrition needs.  Monitor:  PO intake  Assessment:  Pt admitted with symptoms of depression, which include, fatigue, lack of motivation, inability to sleep, dysphoric mood, lack of appetite, for several months that has progressively worsened.  Met with pt who reports poor appetite for over a month with 20 pound unintended weight loss in the past month. States she's just been nibbling on food and snacks at home, not eating full meals. States this is due to stress. Reports no improvement in appetite since admission, only eating bites of food at mealtimes. Agreeable to getting Ensure Complete.   42 y.o. female  Height: Ht Readings from Last 1 Encounters:  03/02/14 5' 1.5" (1.562 m)    Weight: Wt Readings from Last 1 Encounters:  03/02/14 134 lb (60.782 kg)    Weight Hx: Wt Readings from Last 10 Encounters:  03/02/14 134 lb (60.782 kg)  03/02/14 130 lb (58.968 kg)  05/15/12 170 lb (77.111 kg)    BMI:  Body mass index is 24.91 kg/(m^2). Pt meets criteria for normal weight based on current BMI.  Estimated Nutritional Needs: Kcal: 25-30 kcal/kg Protein: > 1 gram protein/kg Fluid: 1 ml/kcal  Diet Order: General Pt is also offered choice of unit snacks mid-morning and mid-afternoon.  Pt is eating as desired.   Lab results and medications reviewed.     Baron MS, RD, LDN [phone] Pager [phone] After Hours Pager  
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
## 2014-03-03 NOTE — Progress Notes (Signed)
Patient ID: Melissa FolksSharon N Maynard, female   DOB: 02/06/1971, 43 y.o.   MRN: [id number] Client seen in her room in the dark and she was tearful. She expressed guilt for not being with her mother who is currently in the ICU as she is a primary caregiver. She said she is not able to speak to her mother because she is on a respirator. Encouraged client to contact a relative that is visiting her mother to get an update and possibly pass a message to her mother so that it will relieve some worry and anxiety she is having. She states her stepdad is there and that she was hoping to call later so that the phone could be put up against her mom's ear so she can talk to her. Also encouraged her to focus on her own mental health right now as she cannot sustain being a primary caregiver if she does not tend to her own needs first. She rates her anxiety an 8/10; gave PRN vistaril. Affect is depressed and sad. Denies thoughts of SI. Will continue to monitor on safety obs q 15 mins.
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
## 2014-03-03 NOTE — [facility] Suicide Risk Assessment (Signed)
[facility] INPATIENT:  Family/Significant Other Suicide Prevention Education  Suicide Prevention Education:  Education Completed; Fara ChuteLisa Porter (pt's cousin) [phone] has been identified by the patient as the family member/significant other with whom the patient will be residing, and identified as the person(s) who will aid the patient in the event of a mental health crisis (suicidal ideations/suicide attempt).  With written consent from the patient, the family member/significant other has been provided the following suicide prevention education, prior to the and/or following the discharge of the patient.  The suicide prevention education provided includes the following:  Suicide risk factors  Suicide prevention and interventions  National Suicide Hotline telephone number  [facility] assessment telephone number  [facility] Emergency Assistance 911  [facility] and/or Residential Mobile Crisis Unit telephone number  Request made of family/significant other to:  Remove weapons (e.g., guns, rifles, knives), all items previously/currently identified as safety concern.    Remove drugs/medications (over-the-counter, prescriptions, illicit drugs), all items previously/currently identified as a safety concern.  The family member/significant other verbalizes understanding of the suicide prevention education information provided.  The family member/significant other agrees to remove the items of safety concern listed above.  Smart, Brinsley Wence LCSWA  03/03/2014, 11:31 AM

## 2014-03-03 NOTE — Progress Notes (Signed)
Patient ID: Matthew FolksSharon N Moes, female   DOB: July 16, 1971, 43 y.o.   MRN: [id number]   D: Pt was laying in bed during the assessment. Informed the writer that she was having some anxiety so she came into her room to calm down. Writer asked pt about her day. Stated she got "started back on meds so gonna see how they work".  Stated the SW gave her some info on after care. Pt voiced no questions or concerns.  A:  Support and encouragement was offered. 15 min checks continued for safety.  R: Pt remains safe.

## 2014-03-04 MED ORDER — TRAZODONE HCL 100 MG PO TABS
100.0000 mg | ORAL_TABLET | Freq: Every evening | ORAL | Status: DC | PRN
Start: 2014-03-04 — End: 2014-03-06
  Administered 2014-03-04 – 2014-03-05 (×2): 100 mg via ORAL
  Filled 2014-03-04 (×5): qty 1
  Filled 2014-03-04 (×2): qty 28
  Filled 2014-03-04 (×2): qty 1

## 2014-03-04 MED ORDER — NICOTINE 21 MG/24HR TD PT24
21.0000 mg | MEDICATED_PATCH | Freq: Every day | TRANSDERMAL | Status: DC
  Administered 2014-03-04 – 2014-03-06 (×3): 21 mg via TRANSDERMAL
  Filled 2014-03-04 (×6): qty 1

## 2014-03-04 MED ORDER — LORAZEPAM 1 MG PO TABS: 1.0000 mg | ORAL_TABLET | Freq: Four times a day (QID) | ORAL | Status: DC | PRN

## 2014-03-04 MED ORDER — DULOXETINE HCL 60 MG PO CPEP
60.0000 mg | ORAL_CAPSULE | Freq: Every day | ORAL | Status: DC
  Administered 2014-03-05 – 2014-03-06 (×2): 60 mg via ORAL
  Filled 2014-03-04 (×2): qty 1
  Filled 2014-03-04: qty 14
  Filled 2014-03-04: qty 1

## 2014-03-04 NOTE — Progress Notes (Signed)
D: Patient appropriate and cooperative with staff. Patient has depressed affect and mood. She reported on the self inventory sheet that sleep and ability to pay attention are poor, appetite is improving and energy level is low. Patient rated depression "6" and feelings of hopelessness "7". She has attended some of the groups today. Patient was excused from groups earlier this afternoon and was given the opportunity to visit her mother in ICU whose health has been declining quickly. Patient is meeting with the Verner MouldChaplain, Matthew and relative at this time, as she has been recently informed that her mother has now passed.  A: Support and encouragement provided to patient. Administered scheduled medications per ordering MD. Monitor Q15 minute checks for safety.  R: Patient receptive. Denies SI/HI and AVH. Patient remains safe on the unit.

## 2014-03-04 NOTE — Progress Notes (Addendum)
In consultation with MD, Chaplain met with Pollyann Kennedy) to inform that mother has died and provide support.  Accompanied by Panda's cousin, Lattie Haw, and later by [employer].  This chaplain had worked with family at [place] [facility] when Antoinetta had come to see mother earlier in the day.  Odeth is not receptive of mother's death and feels that family is "lying" or "playing a trick on her."   Family and chaplain were supportive, providing caring presence, grief support and education, consistency with communication of mother's recent passing, and recounting illness and mother's gifts with pt.  Pt was not able to recognize mother's death as reality.   Pt was momentarily tearful recalling mother's care for her, but would quickly move to rationalizing report of death as "therapeutic intervention" or "lies."   Chaplain worked with family to conceptualize support for pt, emphasizing goal of maintaining pt's perception of supportive relationship over needing pt to accept death.   Will continue to follow Monai for support as she is admitted to [facility] with special attention to her grief in moving toward recognizing her mother's passing.

## 2014-03-04 NOTE — [facility] Group Notes (Signed)
[facility] LCSW Group Therapy  03/04/2014 3:02 PM  Type of Therapy:  Group Therapy  Participation Level:  Did Not Attend-according to Dr. Dub MikesLugo, [facility] her mother who is on life support.   Smart, Chloey Ricard LCSWA  03/04/2014, 3:02 PM
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
## 2014-03-04 NOTE — Progress Notes (Signed)
[facility] MD Progress Note  03/04/2014 4:35 PM Melissa Maynard  MRN:  [id number] Subjective:  Melissa Maynard's  Mother got worst today and Melissa Maynard was allowed to see her. She was later informed that she had died. She has been dealing with the depression as well as suspiciousness states that people here and family members are telling her things and they are not like they say, very vague, not able to say why would these people want to deceive her. There seems to be a sense of unreality Diagnosis:   DSM5: Schizophrenia Disorders:  none Obsessive-Compulsive Disorders:  none Trauma-Stressor Disorders:  none Substance/Addictive Disorders:  none Depressive Disorders:  Major Depressive Disorder - Severe (296.23) R/O with psychotic features Total Time spent with patient: 30 minutes  Axis I: Generalized Anxiety Disorder  ADL's:  Intact  Sleep: Poor  Appetite:  Poor  Suicidal Ideation:  Plan:  denies Intent:  denies Means:  denies Homicidal Ideation:  Plan:  denies Intent:  denies Means:  denies AEB (as evidenced by):  Psychiatric Specialty Exam: Physical Exam  Review of Systems  Constitutional: Negative.   HENT: Negative.   Eyes: Negative.   Respiratory: Negative.   Cardiovascular: Negative.   Gastrointestinal: Negative.   Genitourinary: Positive for flank pain.  Musculoskeletal: Negative.   Skin: Negative.   Neurological: Negative.   Endo/Heme/Allergies: Negative.   Psychiatric/Behavioral: Positive for depression. The patient is nervous/anxious and has insomnia.     Blood pressure 121/78, pulse 83, temperature 97.9 F (36.6 C), temperature source Oral, resp. rate 16, height 5' 1.5" (1.562 m), weight 60.782 kg (134 lb).Body mass index is 24.91 kg/(m^2).  General Appearance: Fairly Groomed  Patent attorney::  Minimal  Speech:  Slow and not spontneous, reserved, guarded  Volume:  Decreased  Mood:  Depressed and suspicious  Affect:  Restricted  Thought Process:  Coherent and Goal Directed   Orientation:  Full (Time, Place, and Person)  Thought Content:  sympotms, worries, concerns  Suicidal Thoughts:  No  Homicidal Thoughts:  No  Memory:  Immediate;   Fair Recent;   Fair Remote;   Fair  Judgement:  Fair  Insight:  Shallow  Psychomotor Activity:  Restlessness  Concentration:  Poor  Recall:  [employer] of Knowledge:NA  Language: Fair  Akathisia:  No  Handed:    AIMS (if indicated):     Assets:  Desire for Improvement Housing Social Support  Sleep:  Number of Hours: 5.25   Musculoskeletal: Strength & Muscle Tone: within normal limits Gait & Station: normal Patient leans: N/A  Current Medications: Current Facility-Administered Medications  Medication Dose Route Frequency Provider Last Rate Last Dose  . acetaminophen (TYLENOL) tablet 650 mg  650 mg Oral Q6H PRN Kerry Hough, PA-C   650 mg at 03/02/14 2306  . alum & mag hydroxide-simeth (MAALOX/MYLANTA) 200-200-20 MG/5ML suspension 30 mL  30 mL Oral Q4H PRN Kerry Hough, PA-C      . [START ON 03/05/2014] DULoxetine (CYMBALTA) DR capsule 60 mg  60 mg Oral Daily Rachael Fee, MD      . feeding supplement (ENSURE COMPLETE) (ENSURE COMPLETE) liquid 237 mL  237 mL Oral BID BM Lavena Bullion, RD   237 mL at 03/04/14 1004  . hydrOXYzine (ATARAX/VISTARIL) tablet 25 mg  25 mg Oral Q6H PRN Kerry Hough, PA-C   25 mg at 03/03/14 2321  . ibuprofen (ADVIL,MOTRIN) tablet 800 mg  800 mg Oral Q6H PRN Rachael Fee, MD   800 mg at  03/03/14 2155  . influenza vac split quadrivalent PF (FLUARIX) injection 0.5 mL  0.5 mL Intramuscular Tomorrow-1000 Rachael FeeIrving A Krystine Pabst, MD      . LORazepam (ATIVAN) tablet 1 mg  1 mg Oral Q6H PRN Rachael FeeIrving A Khyre Germond, MD      . magnesium hydroxide (MILK OF MAGNESIA) suspension 30 mL  30 mL Oral Daily PRN Kerry HoughSpencer E Simon, PA-C      . nicotine (NICODERM CQ - dosed in mg/24 hours) patch 21 mg  21 mg Transdermal Daily Rachael FeeIrving A Avin Upperman, MD      . pneumococcal 23 valent vaccine (PNU-IMMUNE) injection 0.5 mL  0.5 mL  Intramuscular Tomorrow-1000 Rachael FeeIrving A Jabbar Palmero, MD      . traZODone (DESYREL) tablet 100 mg  100 mg Oral QHS,MR X 1 Rachael FeeIrving A Kaian Fahs, MD        Lab Results: No results found for this or any previous visit (from the past 48 hour(s)).  Physical Findings: AIMS: Facial and Oral Movements Muscles of Facial Expression: None, normal Lips and Perioral Area: None, normal Jaw: None, normal Tongue: None, normal,Extremity Movements Upper (arms, wrists, hands, fingers): None, normal Lower (legs, knees, ankles, toes): None, normal, Trunk Movements Neck, shoulders, hips: None, normal, Overall Severity Severity of abnormal movements (highest score from questions above): None, normal Incapacitation due to abnormal movements: None, normal Patient's awareness of abnormal movements (rate only patient's report): No Awareness, Dental Status Current problems with teeth and/or dentures?: No Does patient usually wear dentures?: No  CIWA:  CIWA-Ar Total: 0 COWS:     Treatment Plan Summary: Daily contact with patient to assess and evaluate symptoms and progress in treatment Medication management  Plan: Supportive approach/coping skills           Grief/loss           Matt the chaplain to meet with her           Increase the Trazodone and the Cymbalta           Will make Ativan available on a PRN basis  Medical Decision Making Problem Points:  Established problem, worsening (2), New problem, with no additional work-up planned (3) and Review of psycho-social stressors (1) Data Points:  Review of medication regiment & side effects (2)  I certify that inpatient services furnished can reasonably be expected to improve the patient's condition.   Sheva Mcdougle A 03/04/2014, 4:35 PM

## 2014-03-04 NOTE — [facility] Group Notes (Signed)
[facility]   03/04/2014 9:54 AM  Participation Quality:  DID NOT ATTEND-pt in room sleeping and did not attend morning group. CSW to meet with pt individually today.   Smart, American FinancialHeather LCSWA

## 2014-03-05 DIAGNOSIS — F411 Generalized anxiety disorder: Secondary | ICD-10-CM

## 2014-03-05 MED ORDER — QUETIAPINE FUMARATE 100 MG PO TABS
100.0000 mg | ORAL_TABLET | Freq: Every day | ORAL | Status: DC
  Administered 2014-03-05: 100 mg via ORAL
  Filled 2014-03-05: qty 14
  Filled 2014-03-05 (×2): qty 1

## 2014-03-05 NOTE — Progress Notes (Signed)
Adult Psychoeducational Group Note  Date:  03/05/2014 Time:  9:00 AM  Group Topic/Focus:  Morning Wellness Group  Participation Level:  Active  Participation Quality:  Appropriate and Attentive  Affect:  Appropriate  Cognitive:  Alert and Oriented  Insight: Appropriate  Engagement in Group:  Engaged  Modes of Intervention:  Discussion  Additional Comments:  Pt. verbalized that her goal is to take care of herself first and then help others.  Harold BarbanByrd, Ronecia E 03/05/2014, 9:47 AM

## 2014-03-05 NOTE — Progress Notes (Signed)
Patient ID: Matthew FolksSharon N Hardt, female   DOB: [birth year], 43 y.o.   MRN: [id number]  D: Writer gave the pt her condolences regarding the death of her mother. Informed pt that she would coming down to speak with her. Pt appeared not to know what the writer was talking about. Pt almost seemed to get slightly irritated thru facial expression. Writer did brief assessment, not discussing issues.  A:  Support and encouragement was offered. 15 min checks continued for safety.  R: Pt remains safe.

## 2014-03-05 NOTE — Progress Notes (Signed)
Patient ID: Melissa FolksSharon N Maynard, female   DOB: [birth year], 43 y.o.   MRN: [id number] Pt attended afternoon recreational therapy outside. She sat alone until a patient approached her and she interacted with him.

## 2014-03-05 NOTE — Progress Notes (Signed)
[facility]  03/05/2014 5:24 PM Matthew FolksSharon N Winterrowd  MRN:  [id number] Subjective:  Stated that her mother "passed" She is still having a hard time accepting it and wants to see her in the casket to be able to completely accept it. She is still evidencing some suspiciousness. She did not sleep last night. States she was having a lot of thoughts. Could not quiet her mind Diagnosis:   DSM5: Schizophrenia Disorders:  none Obsessive-Compulsive Disorders:  none Trauma-Stressor Disorders:  none Substance/Addictive Disorders:  none Depressive Disorders:  Major Depressive Disorder - Severe (296.23) R/O psychotic features Total Time spent with patient: 30 minutes  Axis I: Generalized Anxiety Disorder  ADL's:  Intact  Sleep: Poor  Appetite:  Poor  Suicidal Ideation:  Plan:  denies Intent:  denies Means:  denies Homicidal Ideation:  Plan:  denies Intent:  denies Means:  denies AEB (as evidenced by):  Psychiatric Specialty Exam: Physical Exam  Review of Systems  Constitutional: Positive for malaise/fatigue.  HENT: Negative.   Eyes: Negative.   Respiratory: Negative.   Cardiovascular: Negative.   Gastrointestinal: Negative.   Genitourinary: Negative.   Musculoskeletal: Negative.   Skin: Negative.   Neurological: Positive for weakness.  Endo/Heme/Allergies: Negative.   Psychiatric/Behavioral: Positive for depression. The patient is nervous/anxious and has insomnia.     Blood pressure 128/86, pulse 81, temperature 98.3 F (36.8 C), temperature source Oral, resp. rate 18, height 5' 1.5" (1.562 m), weight 60.782 kg (134 lb).Body mass index is 24.91 kg/(m^2).  General Appearance: Fairly Groomed  Patent attorneyye Contact::  Fair  Speech:  Slow and not spontaneous  Volume:  Decreased  Mood:  Depressed  Affect:  Restricted  Thought Process:  Coherent and Goal Directed  Orientation:  Full (Time, Place, and Person)  Thought Content:  symtpoms, suspiciousness, death of her mother  Suicidal  Thoughts:  No  Homicidal Thoughts:  No  Memory:  Immediate;   Fair Recent;   Fair Remote;   Fair  Judgement:  Fair  Insight:  Lacking  Psychomotor Activity:  Restlessness  Concentration:  Poor  Recall:  Poor  Fund of Knowledge:NA  Language: Fair  Akathisia:  No  Handed:    AIMS (if indicated):     Assets:  Desire for Improvement Social Support  Sleep:  Number of Hours: 5.25   Musculoskeletal: Strength & Muscle Tone: within normal limits Gait & Station: normal Patient leans: N/A  Current Medications: Current Facility-Administered Medications  Medication Dose Route Frequency Provider Last Rate Last Dose  . acetaminophen (TYLENOL) tablet 650 mg  650 mg Oral Q6H PRN Kerry HoughSpencer E Simon, PA-C   650 mg at 03/05/14 1708  . alum & mag hydroxide-simeth (MAALOX/MYLANTA) 200-200-20 MG/5ML suspension 30 mL  30 mL Oral Q4H PRN Kerry HoughSpencer E Simon, PA-C      . DULoxetine (CYMBALTA) DR capsule 60 mg  60 mg Oral Daily Rachael FeeIrving A Ramiya Delahunty, MD   60 mg at 03/05/14 0810  . feeding supplement (ENSURE COMPLETE) (ENSURE COMPLETE) liquid 237 mL  237 mL Oral BID BM Lavena BullionHeather W Baron, RD   237 mL at 03/05/14 0953  . hydrOXYzine (ATARAX/VISTARIL) tablet 25 mg  25 mg Oral Q6H PRN Kerry HoughSpencer E Simon, PA-C   25 mg at 03/03/14 2321  . ibuprofen (ADVIL,MOTRIN) tablet 800 mg  800 mg Oral Q6H PRN Rachael FeeIrving A Jo Booze, MD   800 mg at 03/03/14 2155  . influenza vac split quadrivalent PF (FLUARIX) injection 0.5 mL  0.5 mL Intramuscular Tomorrow-1000 Rachael FeeIrving A Trevin Gartrell,  MD      . LORazepam (ATIVAN) tablet 1 mg  1 mg Oral Q6H PRN Rachael Fee, MD      . magnesium hydroxide (MILK OF MAGNESIA) suspension 30 mL  30 mL Oral Daily PRN Kerry Hough, PA-C      . nicotine (NICODERM CQ - dosed in mg/24 hours) patch 21 mg  21 mg Transdermal Daily Rachael Fee, MD   21 mg at 03/05/14 0810  . pneumococcal 23 valent vaccine (PNU-IMMUNE) injection 0.5 mL  0.5 mL Intramuscular Tomorrow-1000 Rachael Fee, MD      . QUEtiapine (SEROQUEL) tablet 100 mg  100  mg Oral QHS Rachael Fee, MD      . traZODone (DESYREL) tablet 100 mg  100 mg Oral QHS,MR X 1 Rachael Fee, MD   100 mg at 03/04/14 2150    Lab Results: No results found for this or any previous visit (from the past 48 hour(s)).  Physical Findings: AIMS: Facial and Oral Movements Muscles of Facial Expression: None, normal Lips and Perioral Area: None, normal Jaw: None, normal Tongue: None, normal,Extremity Movements Upper (arms, wrists, hands, fingers): None, normal Lower (legs, knees, ankles, toes): None, normal, Trunk Movements Neck, shoulders, hips: None, normal, Overall Severity Severity of abnormal movements (highest score from questions above): None, normal Incapacitation due to abnormal movements: None, normal Patient's awareness of abnormal movements (rate only patient's report): No Awareness, Dental Status Current problems with teeth and/or dentures?: No Does patient usually wear dentures?: No  CIWA:  CIWA-Ar Total: 0 COWS:     Treatment Plan Summary: Daily contact with patient to assess and evaluate symptoms and progress in treatment Medication management  Plan: Supportive approach/coping skills           Will use Seroquel 100 mg HS           Will work to facilitate her grief              Medical Decision Making Problem Points:  Review of psycho-social stressors (1) Data Points:  Review of new medications or change in dosage (2)  I certify that inpatient services furnished can reasonably be expected to improve the patient's condition.   Somer Trotter A 03/05/2014, 5:24 PM

## 2014-03-05 NOTE — Progress Notes (Signed)
D: Patient presents with sad, depressed affect and mood. She reported on the self inventory sheet that sleep is poor, improving appetite and ability to pay attention and normal energy level. Patient rated depression "5" and feelings of hopelessness "4". She voiced that she believes everyone is still lying to her about the passing of her mother. Patient is going to groups throughout the day and compliant with medication regimen.  A: Support and encouragement provided to patient. Scheduled medications administered per MD orders. Maintain Q15 minute checks for safety.  R: Patient receptive. Denies SI/HI. Patient remains safe.

## 2014-03-05 NOTE — Clinical Social Work Note (Signed)
Pt attended therapy group on [address] this afternoon.  The Sherwin-WilliamsHeather Smart, LCSWA 03/05/2014 3:16 PM

## 2014-03-05 NOTE — Progress Notes (Signed)
Patient did attend the evening karaoke group.  

## 2014-03-05 NOTE — [facility] Group Notes (Signed)
[facility] LCSW Group Therapy  Living A Balanced Life  1:15 - 2: 30          03/05/2014    Type of Therapy:  Group Therapy  Participation Level:  Appropriate  Participation Quality:  Appropriate  Affect:  Appropriate  Cognitive:  Attentive Appropriate  Insight: Developing/Improving  Engagement in Therapy:  Developing/Improving  Modes of Intervention:  Discussion Exploration Problem-Solving Supportive   Summary of Progress/Problems: Topic for group was Living a Balanced Life.  Patient was able to show how life has become unbalanced.  Patient shared her life is out of balanced due to no longer taking care of her spiritual needs.  After hearing another patient talk about the death of his daughter, she advised she has been told her mother died yesterday but she does not believe it.  Writer questioned patient as to why a family member would tell her mother is dead if it were not true.  She was unable to state a reason but stated she would have to see her mother's dead body before she would accept it.  Patient encourage to talk with someone she trust so that she can confirm the information and work with the loss, if it is true, while she has professional support. Wynn BankerHodnett, Gilmore List Hairston 03/05/2014

## 2014-03-05 NOTE — Progress Notes (Signed)
Patient ID: Melissa FolksSharon N Maynard, female   DOB: [birth year], 43 y.o.   MRN: [id number] Pt went outside during recreation.

## 2014-03-05 NOTE — Progress Notes (Signed)
Patient ID: Matthew FolksSharon N Maynard, female   DOB: 26-Jan-1971, 43 y.o.   MRN: [id number] Pt attended AA group at 10am.

## 2014-03-06 DIAGNOSIS — F321 Major depressive disorder, single episode, moderate: Secondary | ICD-10-CM

## 2014-03-06 MED ORDER — DULOXETINE HCL 60 MG PO CPEP: 60.0000 mg | ORAL_CAPSULE | Freq: Every day | ORAL | Status: DC

## 2014-03-06 MED ORDER — HYDROXYZINE HCL 25 MG PO TABS: ORAL_TABLET | ORAL | Status: DC

## 2014-03-06 MED ORDER — QUETIAPINE FUMARATE 100 MG PO TABS: 100.0000 mg | ORAL_TABLET | Freq: Every day | ORAL | Status: DC

## 2014-03-06 MED ORDER — HYDROXYZINE HCL 25 MG PO TABS
25.0000 mg | ORAL_TABLET | Freq: Three times a day (TID) | ORAL | Status: DC | PRN
  Filled 2014-03-06: qty 30

## 2014-03-06 MED ORDER — TRAZODONE HCL 100 MG PO TABS: 100.0000 mg | ORAL_TABLET | Freq: Every evening | ORAL | Status: AC | PRN

## 2014-03-06 MED ORDER — LORAZEPAM 1 MG PO TABS: 1.0000 mg | ORAL_TABLET | Freq: Two times a day (BID) | ORAL | Status: DC | PRN

## 2014-03-06 NOTE — Progress Notes (Signed)
[facility] :  Will you be returning to the same living situation after discharge: Yes,  home At discharge, do you have transportation home?:Yes,  pt's cousin and support, Merlinda FrederickLisa Do you have the ability to pay for your medications:Yes,  mental health  Release of information consent forms completed and submitted to Medical Records by CSW.  Patient to Follow up at: Follow-up Information   Follow up with [facility]. (Walk in between 8am-9am Monday through Friday for hospital followup/medication management. )    Contact information:   [address] [place], [place] [address] Phone: [phone] Fax: [phone]      [facility] with [facility] On 03/10/2014. (Arrive by 10:45AM for therapy appointment with Rudi RummageSharon Burkett. )    Contact information:   [address]  [place], [place] [address] Phone: [phone] Fax: [phone]     Pt also provided with Hospice bereavement counseling info in chart.   Patient denies SI/HI:   Yes,  during group and self report.     Safety Planning and Suicide Prevention discussed:  Yes,  SPE completed with pt's cousin, Misty StanleyLisa. SPI pamphlet provided to pt and she was encouraged to share information with support network, ask questions, and talk about any concerns relating to SPE.  Smart, Keary Hanak LCSWA  03/06/2014, 11:21 AM

## 2014-03-06 NOTE — Progress Notes (Signed)
Adult Psychoeducational Group Note  Date:  03/06/2014 Time:  2:12 PM  Group Topic/Focus:  Early Warning Signs:   The focus of this group is to help patients identify signs or symptoms they exhibit before slipping into an unhealthy state or crisis.  Participation Level:  Minimal  Participation Quality:  Attentive  Affect:  Flat  Cognitive:  Alert and Appropriate  Insight: Good  Engagement in Group:  Engaged  Modes of Intervention:  Activity, Discussion, Education, Exploration, Socialization and Support  Additional Comments:  Pt came to group and shared that feeling overwhelmed and not having any strength are two of her early warning signs for relapse. Pt plans on speaking to her friends and family, going to therapy, and going to church as her coping skills for relapse prevention.   Cathlean CowerClouse, Dorien Bessent Y 03/06/2014, 2:12 PM

## 2014-03-06 NOTE — [facility] Suicide Risk Assessment (Signed)
Suicide Risk Assessment  Discharge Assessment     Demographic Factors:  NA  Total Time spent with patient: 45 minutes  Psychiatric Specialty Exam:     Blood pressure 115/79, pulse 109, temperature 98.2 F (36.8 C), temperature source Oral, resp. rate 16, height 5' 1.5" (1.562 m), weight 60.782 kg (134 lb).Body mass index is 24.91 kg/(m^2).  General Appearance: Fairly Groomed  Patent attorneyye Contact::  Fair  Speech:  Clear and Coherent  Volume:  Decreased  Mood:  sad  Affect:  Restricted  Thought Process:  Coherent and Goal Directed  Orientation:  Full (Time, Place, and Person)  Thought Content:  symptoms, worries, concerns, loss of her mother  Suicidal Thoughts:  No  Homicidal Thoughts:  No  Memory:  Immediate;   Fair Recent;   Fair Remote;   Fair  Judgement:  Fair  Insight:  Shallow  Psychomotor Activity:  Normal  Concentration:  Fair  Recall:  F[employer]Fair  Fund of Knowledge:NA  Language: Fair  Akathisia:  No  Handed:    AIMS (if indicated):     Assets:  Desire for Improvement Housing Social Support  Sleep:  Number of Hours: 5.75    Musculoskeletal: Strength & Muscle Tone: within normal limits Gait & Station: normal Patient leans: N/A   Mental Status Per Nursing Assessment::   On Admission:  NA  Current Mental Status by Physician: NA  Loss Factors: Loss of significant relationship and death of mother  Historical Factors: NA  Risk Reduction Factors:   Sense of responsibility to family and Positive social support  Continued Clinical Symptoms:  Depression:   Insomnia  Cognitive Features That Contribute To Risk:  Closed-mindedness Polarized thinking Thought constriction (tunnel vision)    Suicide Risk:  Minimal: No identifiable suicidal ideation.  Patients presenting with no risk factors but with morbid ruminations; may be classified as minimal risk based on the severity of the depressive symptoms  Discharge Diagnoses:   AXIS I:  Major Depression single  episode severe, Bereavement AXIS II:  No diagnosis AXIS III:   Past Medical History  Diagnosis Date  . Fibromyalgia   . Sickle cell trait   . Bursitis     Left knee   AXIS IV:  death of her mother AXIS V:  61-70 mild symptoms  Plan Of Care/Follow-up recommendations:  Activity:  as tolerated Diet:  regular Follow up outpatient basis/Hospice Is patient on multiple antipsychotic therapies at discharge:  No   Has Patient had three or more failed trials of antipsychotic monotherapy by history:  No  Recommended Plan for Multiple Antipsychotic Therapies: NA    Freya Zobrist A 03/06/2014, 10:45 AM

## 2014-03-06 NOTE — Progress Notes (Signed)
Pt observed resting in bed with eyes closed. RR WNL, even and unlabored. Level III obs in place for safety and pt remains safe. Melissa Maynard  

## 2014-03-06 NOTE — Progress Notes (Signed)
Discharge Note: Discharge instructions/prescriptions/medication samples given to patient. Patient verbalized understanding of discharge instructions and prescriptions. Returned belongings to patient. Denies SI/HI/AVH. Patient d/c without incident to the lobby and transported home by relative. 

## 2014-03-06 NOTE — Discharge Summary (Signed)
Physician Discharge Summary Note  Patient:  Melissa Maynard is an 43 y.o., female MRN:  [id number] DOB:  07/06/1971 Patient phone:  [phone] (home)  Patient [place]   [address] [place] [address],  Total Time spent with patient: Greater than 30 minutes  Date of Admission:  03/02/2014  Date of Discharge: 03/06/14  Reason for Admission: Mood stabilization  Discharge Diagnoses: Active Problems:   Mood disorder   Psychiatric Specialty Exam: Physical Exam  Constitutional: She is oriented to person, place, and time. She appears well-developed.  HENT:  Head: Normocephalic.  Eyes: Pupils are equal, round, and reactive to light.  Neck: Normal range of motion.  Cardiovascular: Normal rate.   Respiratory: Effort normal.  GI: Soft.  Genitourinary:  Denies any issues in this area  Musculoskeletal: Normal range of motion.  Neurological: She is alert and oriented to person, place, and time.  Skin: Skin is warm and dry.  Psychiatric: Her speech is normal and behavior is normal. Judgment normal. Her mood appears not anxious. Her affect is not angry, not blunt, not labile and not inappropriate. Thought content is not paranoid and not delusional. Cognition and memory are normal. Depressed: Stable. She expresses no homicidal and no suicidal ideation. She expresses no suicidal plans and no homicidal plans.    Review of Systems  Constitutional: Negative.   HENT: Negative.   Eyes: Negative.   Respiratory: Negative.   Cardiovascular: Negative.   Gastrointestinal: Negative.   Genitourinary: Negative.   Musculoskeletal: Negative.   Skin: Negative.   Neurological: Negative.   Endo/Heme/Allergies: Negative.   Psychiatric/Behavioral: Positive for depression (Stabilized with medication prior to discharge ). Negative for suicidal ideas, hallucinations, memory loss and substance abuse. The patient has insomnia (Stabilized with medication prior to discharge). The patient is not  nervous/anxious.     Blood pressure 115/79, pulse 109, temperature 98.2 F (36.8 C), temperature source Oral, resp. rate 16, height 5' 1.5" (1.562 m), weight 60.782 kg (134 lb).Body mass index is 24.91 kg/(m^2).  General Appearance: Casual and Fairly Groomed  Patent attorneyye Contact::  Fair  Speech:  Clear and Coherent  Volume:  Normal  Mood:  Stable  Affect:  Appropriate and Congruent  Thought Process:  Coherent  Orientation:  Full (Time, Place, and Person)  Thought Content:  Denies any hallucinations, delusional thinking, however, exhibits paranoid ideation at times  Suicidal Thoughts:  No  Homicidal Thoughts:  No  Memory:  Immediate;   Good Recent;   Good Remote;   Good  Judgement:  Fair  Insight:  Fairly present  Psychomotor Activity:  Normal  Concentration:  Fair  Recall:  Good  Fund of Knowledge:Fair  Language: Good  Akathisia:  No  Handed:  Right  AIMS (if indicated):     Assets:  Desire for Improvement  Sleep:  Number of Hours: 5.75    Past Psychiatric History: Diagnosis: Major Depressive Disorder - Moderate (296.22)  [facility]  [facility]  Substance Abuse Care: NA  Self-Mutilation: NA  Suicidal Attempts: NA  Violent Behaviors: NA   Musculoskeletal: Strength & Muscle Tone: within normal limits Gait & Station: normal Patient leans: N/A  DSM5: Schizophrenia Disorders:  NA Obsessive-Compulsive Disorders:  NA Trauma-Stressor Disorders:  NA Substance/Addictive Disorders:  NA Depressive Disorders:  Major Depressive Disorder - Moderate (296.22)  Axis Diagnosis:   AXIS I:  Major Depressive Disorder - Moderate (296.22) AXIS II:  Deferred AXIS III:   Past Medical History  Diagnosis  Date  . Fibromyalgia   . Sickle cell trait   . Bursitis     Left knee   AXIS IV:  Mental illness, chronic, recent loss of a loved one AXIS V:  64  Level of Care:  OP  Hospital Course: 43 Y/O female who states she  has been Depressed cant sleep cant eat, lots of thoughts in her head: family, job, "everything around me." Thinking that she cant take care of her son (adopted from her cousin). Thinks she cant do a good job. Has been depressed for a while really bad for the last 6 months to a year. Can't pay attention can't concentrate. States by the way she was goinng down, not taking care of herself, not eating she was hurting herself.   After admission assessment and evaluation based on her reason for admission, it was determined that Melissa Maynard will need some treatment regimen to help combat and re-stabilize her current depressive mood symptoms. She was then ordered and received Seroquel 100 mg Q bedtime for mood control, hydroxyzine 25 mg tid PRN for anxiety, Duloxetine 60 mg daily for depression, Lorazepam 1 mg twice daily for anxiety and Trazodone 100 mg for sleep. She was also enrolled in group counseling sessions and activities in which she participated actively on daily basis.However, Melissa Maynard was noted to be having a rough time dealing with the fact that her mother was chronically ill and likely would not survive her illness. She was in denial even to the point of going to the hospital to see her mother after she had already passed. She does not believe that her mother is really dead.  She was provided with counseling sessions, a lot of support from staff and family members to face with the reality that her mother is not coming home. She is doing well enough to go home today to help plan her mother's funeral. Hopefully, being involved in this funeral arrangement will help her come to the realization of her mother's passing, and may help initiate her grieving process in a more manageable fashion. Melissa Maynard, however will continue psychiatric treatment, routine counseling and medication management at the [facility] and [facility] here in [place], [place]. She has been provided with all the pertinent  information needed to make this appointment without difficulty. Melissa Maynard is also instructed and encouraged to join a grief counseling group such as the hospice.  Upon discharge, Samanth adamantly denies any suicidal, homicidal ideations, auditory, visual hallucinations and or delusional thinking. She left [facility] with all personal belongings via personal arranged transportation in no apparent distress. Transportation per cousin. She is provided with a 14 days worth, supply samples of [facility] discharge medication.  Consults:  psychiatry  Significant Diagnostic Studies:  labs: CBC with diff, CMP, UDS, toxicology tests, U/A  Discharge Vitals:   Blood pressure 115/79, pulse 109, temperature 98.2 F (36.8 C), temperature source Oral, resp. rate 16, height 5' 1.5" (1.562 m), weight 60.782 kg (134 lb). Body mass index is 24.91 kg/(m^2). Lab Results:   No results found for this or any previous visit (from the past 72 hour(s)).  Physical Findings: AIMS: Facial and Oral Movements Muscles of Facial Expression: None, normal Lips and Perioral Area: None, normal Jaw: None, normal Tongue: None, normal,Extremity Movements Upper (arms, wrists, hands, fingers): None, normal Lower (legs, knees, ankles, toes): None, normal, Trunk Movements Neck, shoulders, hips: None, normal, Overall Severity Severity of abnormal movements (highest score from questions above): None, normal Incapacitation due to abnormal movements: None,  normal Patient's awareness of abnormal movements (rate only patient's report): No Awareness, Dental Status Current problems with teeth and/or dentures?: No Does patient usually wear dentures?: No  CIWA:  CIWA-Ar Total: 0 COWS:     Psychiatric Specialty Exam: See Psychiatric Specialty Exam and Suicide Risk Assessment completed by Attending Physician prior to discharge.  Discharge destination:  Home  Is patient on multiple antipsychotic therapies at discharge:  No   Has Patient had three or more  failed trials of antipsychotic monotherapy by history:  No  Recommended Plan for Multiple Antipsychotic Therapies: NA     Medication List    STOP taking these medications       amitriptyline 10 MG tablet  Commonly known as:  ELAVIL      TAKE these medications     Indication   DULoxetine 60 MG capsule  Commonly known as:  CYMBALTA  Take 1 capsule (60 mg total) by mouth daily. For depression   Indication:  Major Depressive Disorder     hydrOXYzine 25 MG tablet  Commonly known as:  ATARAX/VISTARIL  Take 25 mg (1 tablet) three times daily as needed: Anxiety/tension   Indication:  Tension, Anxiety     LORazepam 1 MG tablet  Commonly known as:  ATIVAN  Take 1 tablet (1 mg total) by mouth 2 (two) times daily as needed for anxiety.   Indication:  Anxiousness associated with Depression     QUEtiapine 100 MG tablet  Commonly known as:  SEROQUEL  Take 1 tablet (100 mg total) by mouth at bedtime. For mood control   Indication:  Mood control     traZODone 100 MG tablet  Commonly known as:  DESYREL  Take 1 tablet (100 mg total) by mouth at bedtime and may repeat dose one time if needed. For sleep   Indication:  Trouble Sleeping       Follow-up Information   Follow up with [facility]. (Walk in between 8am-9am Monday through Friday for hospital followup/medication management. )    Contact information:   [address] [place] [address] Phone: [phone] Fax: [phone]      [facility] with [facility] On 03/10/2014. (Arrive by 10:45AM for therapy appointment with Rudi Rummage. )    Contact information:   [address] [place] [address] Phone: [phone] Fax: [phone]     Follow-up recommendations: Activity:  As tolerated Diet: As recommended by your primary care doctor. Keep all scheduled follow-up appointments as recommended.     Comments:  Take all your medications as prescribed by your mental healthcare provider. Report any adverse  effects and or reactions from your medicines to your outpatient provider promptly. Patient is instructed and cautioned to not engage in alcohol and or illegal drug use while on prescription medicines. In the event of worsening symptoms, patient is instructed to call the crisis hotline, 911 and or go to the nearest ED for appropriate evaluation and treatment of symptoms. Follow-up with your primary care provider for your other medical issues, concerns and or health care needs.   Total Discharge Time:  Greater than 30 minutes.  Signed: Sanjuana Kava, PMHNp, FNP 03/06/2014, 10:07 AM Personally evaluated the patient, agree with assessment and plan Madie Reno A. Dub Mikes, M.D.

## 2014-03-06 NOTE — [facility] Group Notes (Signed)
[facility] Discharge Planning [facility] Note   03/06/2014 9:46 AM  Participation Quality:  Minimal   Mood/Affect:  Flat  Depression Rating:  3  Anxiety Rating:  3  Thoughts of Suicide:  No Will you contract for safety?   NA  Current AVH:  No  Plan for Discharge/Comments:  Pt reports that she is d/cing today in order to plan her mother's funeral.  [facility]. [facility] and was given Hospice bereavement counseling info (in chart).   Transportation Means: cousin  Supports: cousin; some family supports   Counselling psychologistmart, OncologistHeather LCSWA

## 2014-03-11 NOTE — Progress Notes (Signed)
Patient Discharge Instructions:  After Visit Summary (AVS):   Faxed to:  03/11/14 Discharge Summary Note:   Faxed to:  03/11/14 Psychiatric Admission Assessment Note:   Faxed to:  03/11/14 Suicide Risk Assessment - Discharge Assessment:   Faxed to:  03/11/14 Faxed/Sent to the Next Level Care provider:  03/11/14 Faxed to [facility] @ [phone] [facility] @ [phone] Jerelene ReddenSheena E [place], 03/11/2014, 3:27 PM

## 2016-06-16 ENCOUNTER — Encounter (HOSPITAL_COMMUNITY): Payer: Self-pay | Admitting: *Deleted

## 2016-06-16 ENCOUNTER — Emergency Department (HOSPITAL_COMMUNITY)
Admission: EM | Admit: 2016-06-16 | Discharge: 2016-06-16 | Disposition: A | Discharge status: Home / Self Care | Payer: Medicaid Other | Attending: Emergency Medicine | Admitting: Emergency Medicine

## 2016-06-16 DIAGNOSIS — F32A Depression, unspecified: Secondary | ICD-10-CM

## 2016-06-16 DIAGNOSIS — F17210 Nicotine dependence, cigarettes, uncomplicated: Secondary | ICD-10-CM | POA: Insufficient documentation

## 2016-06-16 DIAGNOSIS — F329 Major depressive disorder, single episode, unspecified: Secondary | ICD-10-CM | POA: Insufficient documentation

## 2016-06-16 DIAGNOSIS — Z79899 Other long term (current) drug therapy: Secondary | ICD-10-CM | POA: Diagnosis not present

## 2016-06-16 LAB — COMPREHENSIVE METABOLIC PANEL
ALBUMIN: 4.3 g/dL (ref 3.5–5.0)
ALK PHOS: 70 U/L (ref 38–126)
ALT: 29 U/L (ref 14–54)
ANION GAP: 8 (ref 5–15)
AST: 24 U/L (ref 15–41)
BUN: 14 mg/dL (ref 6–20)
CHLORIDE: 104 mmol/L (ref 101–111)
CO2: 27 mmol/L (ref 22–32)
Calcium: 9.9 mg/dL (ref 8.9–10.3)
Creatinine, Ser: 0.66 mg/dL (ref 0.44–1.00)
GFR calc non Af Amer: >60 mL/min (ref >60)
GLUCOSE: 82 mg/dL (ref 65–99)
Potassium: 3.3 mmol/L — ABNORMAL LOW (ref 3.5–5.1)
SODIUM: 139 mmol/L (ref 135–145)
Total Bilirubin: 0.4 mg/dL (ref 0.3–1.2)
Total Protein: 7.5 g/dL (ref 6.5–8.1)

## 2016-06-16 LAB — ETHANOL: Alcohol, Ethyl (B): <5 mg/dL (ref <5)

## 2016-06-16 LAB — CBC
HEMATOCRIT: 39.6 % (ref 36.0–46.0)
HEMOGLOBIN: 13.9 g/dL (ref 12.0–15.0)
MCH: 29 pg (ref 26.0–34.0)
MCHC: 35.1 g/dL (ref 30.0–36.0)
MCV: 82.5 fL (ref 78.0–100.0)
PLATELETS: 340 10*3/uL (ref 150–400)
RBC: 4.8 MIL/uL (ref 3.87–5.11)
RDW: 13.5 % (ref 11.5–15.5)
WBC: 9 10*3/uL (ref 4.0–10.5)

## 2016-06-16 LAB — SALICYLATE LEVEL

## 2016-06-16 LAB — RAPID URINE DRUG SCREEN, HOSP PERFORMED
AMPHETAMINES: NOT DETECTED
BARBITURATES: NOT DETECTED
BENZODIAZEPINES: POSITIVE — AB
COCAINE: NOT DETECTED
Opiates: NOT DETECTED
Tetrahydrocannabinol: NOT DETECTED

## 2016-06-16 LAB — I-STAT BETA HCG BLOOD, ED (MC, WL, AP ONLY)

## 2016-06-16 LAB — ACETAMINOPHEN LEVEL

## 2016-06-16 MED ORDER — NICOTINE 21 MG/24HR TD PT24: 21.0000 mg | MEDICATED_PATCH | Freq: Every day | TRANSDERMAL | Status: DC | PRN

## 2016-06-16 MED ORDER — ONDANSETRON HCL 4 MG PO TABS: 4.0000 mg | ORAL_TABLET | Freq: Three times a day (TID) | ORAL | Status: DC | PRN

## 2016-06-16 MED ORDER — MELOXICAM 7.5 MG PO TABS
7.5000 mg | ORAL_TABLET | Freq: Once | ORAL | Status: DC
  Filled 2016-06-16: qty 1

## 2016-06-16 MED ORDER — ACETAMINOPHEN 325 MG PO TABS: 650.0000 mg | ORAL_TABLET | ORAL | Status: DC | PRN

## 2016-06-16 MED ORDER — GABAPENTIN 400 MG PO CAPS: 800.0000 mg | ORAL_CAPSULE | Freq: Two times a day (BID) | ORAL | Status: AC

## 2016-06-16 MED ORDER — MELOXICAM 7.5 MG PO TABS: 7.5000 mg | ORAL_TABLET | Freq: Every day | ORAL | Status: AC

## 2016-06-16 MED ORDER — LORAZEPAM 1 MG PO TABS
1.0000 mg | ORAL_TABLET | Freq: Three times a day (TID) | ORAL | Status: DC | PRN
  Administered 2016-06-16: 1 mg via ORAL
  Filled 2016-06-16: qty 1

## 2016-06-16 MED ORDER — ESCITALOPRAM OXALATE 20 MG PO TABS: 20.0000 mg | ORAL_TABLET | Freq: Every day | ORAL | Status: AC

## 2016-06-16 MED ORDER — ALUM & MAG HYDROXIDE-SIMETH 200-200-20 MG/5ML PO SUSP: 30.0000 mL | ORAL | Status: DC | PRN

## 2016-06-16 MED ORDER — POTASSIUM CHLORIDE CRYS ER 20 MEQ PO TBCR
40.0000 meq | EXTENDED_RELEASE_TABLET | Freq: Once | ORAL | Status: AC
  Administered 2016-06-16: 40 meq via ORAL
  Filled 2016-06-16: qty 2

## 2016-06-16 MED ORDER — BACLOFEN 10 MG PO TABS: 10.0000 mg | ORAL_TABLET | ORAL | Status: AC

## 2016-06-16 NOTE — ED Provider Notes (Signed)
CSN: [id number]     Arrival date & time 06/16/16  1635 History   First MD Initiated Contact with Patient 06/16/16 1913     Chief Complaint  Patient presents with  . Depression  . Medical Clearance     (Consider location/radiation/quality/duration/timing/severity/associated sxs/prior Treatment) HPI Comments: Patient presents with complaint of worsening depression and suicidal ideation over the past 1 week. Patient states that [place] She is currently living in a shelter. She has been out of her antidepressants for 1 week. Also out of gabapentin for 2 days. She continues to take Klonopin for anxiety. Last night patient wished she was no longer here, but did not have a concrete plan. She [facility] [place] [facility] and was told that she would not be able to be seen for approximately one month. This prompted emergency department visit. She denies any other acute medical complaints. She has chronic pain in her lower extremities she treats to fibromyalgia. She takes Motrin for this and is requesting that here. Onset of symptoms acute. Course is gradually worsening. Nothing makes symptoms better or worse.  The history is provided by the patient.    Past Medical History  Diagnosis Date  . Fibromyalgia   . Sickle cell trait (HCC)   . Bursitis     Left knee   Past Surgical History  Procedure Laterality Date  . Cholecystectomy    . Breast lumpectomy      right breast  . Ablasion     History reviewed. No pertinent family history. Social History  Substance Use Topics  . Smoking status: Current Some Day Smoker -- 0.25 packs/day for 30 years    Types: Cigarettes  . Smokeless tobacco: None  . Alcohol Use: Yes     Comment: occasionally   OB History    No data available     Review of Systems  Constitutional: Negative for fever.  HENT: Negative for rhinorrhea and sore throat.   Eyes: Negative for redness.  Respiratory:  Negative for cough.   Cardiovascular: Negative for chest pain.  Gastrointestinal: Negative for nausea, vomiting, abdominal pain and diarrhea.  Genitourinary: Negative for dysuria.  Musculoskeletal: Positive for myalgias.  Skin: Negative for rash.  Neurological: Negative for headaches.  Psychiatric/Behavioral: Positive for suicidal ideas and dysphoric mood. Negative for self-injury. The patient is nervous/anxious.       Allergies  Review of patient's allergies indicates no known allergies.  Home Medications   Prior to Admission medications   Medication Sig Start Date End Date Taking? Authorizing Provider  DULoxetine (CYMBALTA) 60 MG capsule Take 1 capsule (60 mg total) by mouth daily. For depression 03/06/14   Sanjuana KavaAgnes I Nwoko, NP  hydrOXYzine (ATARAX/VISTARIL) 25 MG tablet Take 25 mg (1 tablet) three times daily as needed: Anxiety/tension 03/06/14   Sanjuana KavaAgnes I Nwoko, NP  LORazepam (ATIVAN) 1 MG tablet Take 1 tablet (1 mg total) by mouth 2 (two) times daily as needed for anxiety. 03/06/14   Sanjuana KavaAgnes I Nwoko, NP  QUEtiapine (SEROQUEL) 100 MG tablet Take 1 tablet (100 mg total) by mouth at bedtime. For mood control 03/06/14   Sanjuana KavaAgnes I Nwoko, NP  traZODone (DESYREL) 100 MG tablet Take 1 tablet (100 mg total) by mouth at bedtime and may repeat dose one time if needed. For sleep 03/06/14   Sanjuana KavaAgnes I Nwoko, NP   BP 123/86 mmHg  Pulse 70  Temp(Src) 98.5 F (36.9 C) (Oral)  Resp 18  SpO2 99% Physical Exam  Constitutional: She appears well-developed and well-nourished.  HENT:  Head: Normocephalic and atraumatic.  Eyes: Conjunctivae are normal. Right eye exhibits no discharge. Left eye exhibits no discharge.  Neck: Normal range of motion. Neck supple.  Cardiovascular: Normal rate, regular rhythm and normal heart sounds.   Pulmonary/Chest: Effort normal and breath sounds normal.  Abdominal: Soft. There is no tenderness.  Neurological: She is alert.  Skin: Skin is warm and dry.  Psychiatric: Her speech  is normal and behavior is normal. Judgment normal. She exhibits a depressed mood. She expresses suicidal ideation. She expresses no homicidal ideation. She expresses no suicidal plans and no homicidal plans.  Nursing note and vitals reviewed.   ED Course  Procedures (including critical care time) Labs Review Labs Reviewed  COMPREHENSIVE METABOLIC PANEL - Abnormal; Notable for the following:    Potassium 3.3 (*)    All other components within normal limits  ACETAMINOPHEN LEVEL - Abnormal; Notable for the following:    Acetaminophen (Tylenol), Serum <10 (*)    All other components within normal limits  URINE RAPID DRUG SCREEN, HOSP PERFORMED - Abnormal; Notable for the following:    Benzodiazepines POSITIVE (*)    All other components within normal limits  ETHANOL  SALICYLATE LEVEL  CBC  I-STAT BETA HCG BLOOD, ED ([facility], [facility], [facility] ONLY)    8:10 PM Patient seen and examined. Patient is medically cleared. Awaiting TTS consult. Will give oral potassium for K=3.3.    Vital signs reviewed and are as follows: BP 123/86 mmHg  Pulse 70  Temp(Src) 98.5 F (36.9 C) (Oral)  Resp 18  SpO2 99%  10:46 PM patient evaluated by psychiatry. Feel that she does not meet inpatient criteria. Patient states that she is in agreement with this plan. She does request refills of her medication. I provided the patient, at the urging of psychiatry, with a 15 day supply of citalopram, gabapentin, meloxicam, and baclofen. Patient also given outpatient psychiatry and outpatient PCP referrals.  Encouraged patient to return to the emergency department with worsening symptoms, suicidal ideation, or other concerns. She verbalizes understanding and agrees with plan.  MDM   Final diagnoses:  Depression   Patient with evaluation as above. Discharged home with medications, referrals. Seen by psych-does not meet inpatient criteria.    Renne CriglerJoshua Marco Adelson, PA-C 06/16/16 2248  Pricilla LovelessScott Goldston, MD 06/19/16 1710

## 2016-06-16 NOTE — Discharge Instructions (Signed)
Please read and follow all provided instructions.  Your diagnoses today include:  1. Depression     Tests performed today include:  Vital signs. See below for your results today.   Medications prescribed:   None  Home care instructions:  Follow any educational materials contained in this packet.  Follow-up instructions: Please follow-up with your primary care provider as needed for further evaluation of your symptoms. See below for referrals.  Return instructions:   Please return to the Emergency Department if you experience worsening symptoms.   Please return if you have any other emergent concerns.  Additional Information:  Your vital signs today were: BP 123/86 mmHg   Pulse 70   Temp(Src) 98.5 F (36.9 C) (Oral)   Resp 18   SpO2 99% If your blood pressure (BP) was elevated above 135/85 this visit, please have this repeated by your doctor within one month. --------------- [facility]/Substance Abuse Adult The United Ways 211 is a great source of information about community services available.  Access by dialing 2-1-1 from anywhere in [place], or by website -  PooledIncome.pl.   Other Local Resources (Updated 12/2015)  Crisis Hotlines   Services     Area Served  [employer]  Crisis Hotline, available 24 hours a day, 7 days a week: 856-102-0929 [facility], [place]   [facility]  Crisis Hotline, available 24 hours a day, 7 days a week: (351)737-9744 [facility], [place]  [facility]  Suicide Prevention Hotline, available 24 hours a day, 7 days a week: 276-636-7335 [facility], [place]  [employer], available 24 hours a day, 7 days a week: (848)288-6800 [facility], [place]   [facility] Access to [employer], available 24 hours a day, 7 days a week: (936)413-2579 All   [facility]  Crisis Hotline, available 24 hours a day, 7 days a  week: (417) 175-0805 All   Other Local Resources (Updated 12/2015)  Outpatient Counseling/ Substance Abuse Programs  Services     Address and Phone Number  ADS (Alcohol and Drug Services)   Options include Individual counseling, group counseling, intensive outpatient program (several hours a day, several days a week)  Offers depression assessments  Provides methadone maintenance program [phone] [address] [place], [place] [address]   [facility]   Offers partial hospitalization/day treatment and DUI/DWI programs  [employer], private insurance (702)[phone] [address] [place], [place] [address]  [facility]    Services include intensive outpatient program (several hours a day, several days a week), outpatient treatment, DUI/DWI services, family education  Also has some services specifically for [employer] transitional housing  [phone] [address] [place], [place] [address]     [facility]  [employer], private pay, and private insurance [phone] [address] [place], [place] [address]  [employer] of Care  Services include individual counseling, substance abuse intensive outpatient program (several hours a day, several days a week), day treatment  Delene Loll, Medicaid, private insurance [phone] [address] [place], [place] [address]  [facility]   Offers substance abuse intensive outpatient program (several hours a day, several days a week), partial hospitalization program [phone] [address] [place], [place] [address]  [phone] [address] [place], [place] [address]  [phone] [address] [place], [place] [address]  [phone], [address] [place],  [place] [address]  [facility]  Individual counseling only  Accepts private  insurance only [phone] [address] [place], [place] [address]  [facility]  Methadone maintenance program [phone] [address] [place], [place] [address]  [facility]  Walk-In Clinic providing substance abuse and mental health counseling  Accepts Medicaid, Medicare, private insurance  Offers sliding scale for uninsured [phone] [address] [place], [place]   [facility] [place], [employer].  Offers individual counseling, and intensive in-home services [phone] [address] [place], [place] [address]  [facility] individual counseling, family counseling, group therapy, domestic violence counseling, consumer credit counseling  Accepts Medicare, Medicaid, private insurance  Offers sliding scale for uninsured [phone] [address] [place], [place] [address]  [phone] [facility], [address] [place], [place] [address]  [facility]  Offers individual, family and group counseling  3 locations - [place], [place], and [place]  [phone]  [address] [place], [place] [address]  [address] [place], [place] [address]  [address] [place], [place] [address]  [facility] Margo Aye    Offers psychiatric assessment, 8-week Intensive Outpatient Program (several hours a day, several times a week, daytime or evenings), early recovery group, family Program, medication management  Private pay or private insurance only 7400170495, or  ([phone] [address] [place], [place] [address]  [place] [employer] individual, couples and family counseling  Accepts Medicaid, private insurance, and sliding scale for uninsured [phone] [address] [place], [place] [address]  Len Blalock, MD  Individual counseling  Private insurance [phone] [address] [place], [place] [address]  [facility]   Offers  assessment, substance abuse treatment, and behavioral health treatment [phone] [address] [place], [place] [address]  [facility]  Individual counseling  Accepts private insurance [phone] [address] [place], [place] [address]  [facility]  Individual counseling  Delene Loll, private insurance [phone] [address] [place], [place] [address]  [facility]    Offers intensive outpatient program (several hours a day, several times a week)  Private pay, private insurance [phone] [facility] [place], [place]  [facility]  Individual counseling  Medicare, private insurance [phone] [address] [place], [place] [address]  [facility]    Offers intensive outpatient program (several hours a day, several times a week) and partial hospitalization program [phone] [address] [place], [place] [address]  Emerson Monte, MD  Individual counseling [phone] [address] [place], [place] [address]  [facility]  Offers Christian counseling to individuals, couples, and families  Accepts Medicare and private insurance; offers sliding scale for uninsured [phone] [address] [place], [place] [address]  [facility]  [place] counseling [phone] [address] [place], [place] [address]  [facility] crisis counseling, individual counseling, group therapy, in-home therapy, domestic violence services, day treatment, DWI services, Administrator, arts (CST), Assertive Community Treatment Team (ACTT), substance abuse Intensive Outpatient Program (several hours a day, several times a week)  2 locations - [place] and [place] [phone] [address] [place], [place] [address]  [phone] [address]  [place], [place] [address]  [facility]     Individual counseling and group therapy  Accepts private insurance, [place], [place] [phone] [address] [place], [place]  [facility]  Counseling  Offers individual and family counseling  Offers LGBTQ services  Accepts private insurance and private pay [phone] [address] [place], [place] [address]  [facility]    Offers individual counseling, group therapy, and outpatient detox  Accepts private insurance [phone] [address] [place], [place]  [facility]  Individual counseling  Accepts Medicare, private insurance [phone] [address] [place], [place] [address]  [employer]  Individual counseling  Accepts Medicare, private insurance [phone] [address] [place], [place] [address]  [facility]   Offers substance abuse Intensive Outpatient Program (several hours a day, several times a week) [phone], or [phone] [place], [place]    [employer] The United Ways 211 is a great source of information about community services available.  Access by dialing 2-1-1 from anywhere in [place], or by website -  PooledIncome.pl.   Other Local Resources (Updated 12/2015)  Financial Assistance   Services    Phone Number and Address  [facility]  Low-cost medical care - 1st and 3rd Saturday of every month  Must not qualify for public or private insurance and must have limited income [phone] [address] [place], [place]    [employer] of Social Services  Child care  Emergency assistance for housing and Kimberly[employer]Clark  Medicaid [phone] [address] [place], [place] [address]   [facility]  Low-cost medical care for children, communicable diseases,  sexually-transmitted diseases, immunizations, maternity care, womens health and family planning [phone] [address] [place], [place] [address]  [facility]   Medication assistance for [facility] residents  Must meet income requirements [phone] [address] [place], [place].    [facility] Social Services  Child care  Emergency assistance for housing and Kimberly[employer]Clark  Medicaid [phone] [address] [place], [place] [address]  [facility]   Low-cost medical care,   Monday through Friday, 9 am to 6 pm.   Accepts Medicare/Medicaid, and self-pay [phone] [address] [place], [place] [address]  [facility]  Low-cost medical care - Monday through Friday, 8:30 am - 5:30 pm  Accepts Medicaid and self-pay [phone] [address] [place], [place] [address]   [facility]  Primary medical care, including for those with sickle cell disease  Accepts Medicare, Medicaid, insurance and self-pay [phone] [address] [place], [place]  [facility]   Primary medical care  Accepts Medicare, [place], insurance and self-pay [phone] [address], [address] [place], [place] [address]   [facility] Department of Social Services  Child care  Emergency assistance for housing and Kimberly[employer]Clark  Medicaid [phone] [address] [place], [place] [address]  [facility] Department of Health and [employer]  Child care  Emergency assistance for housing and Kimberly[employer]ark  Medicaid [phone] [address] [place], [place] [address]   [facility] Medication Assistance Program  Medication assistance for [facility] residents with no insurance only  Must have a primary care doctor [phone] [address] [place], [place]  [facility]   Primary medical care  [place], [place], insurance  [phone] [address] [place], [place]  [facility]UDJSHF   Medication assistance [phone]  [facility]   Primary medical  care  Accepts Medicare, Medicaid, insurance and self-pay [phone] [address] [place], [place] [id number]  [facility]   Primary medical care  Accepts Medicare, [place]icaid, insurance and self-pay [phone] [address] [place], [place] [phone]  [facility]  For [place] residents between the ages of 7018 and 7964 who do not have any form of health insurance, Medicare, [place]icaid, or [place] benefits.  Services are provided free of charge to uninsured patients who fall within federal poverty guidelines.    Hours: Tuesdays and Thursdays, 4:15 - 8 pm [phone] [address], [address] [place], [place] [id number]  [facility]     Primary medical care  Dental care  Nutritional counseling  Pharmacy  Accepts Medicaid, Medicare, most insurance.  Fees are adjusted based on ability to pay.   [phone] [facility] [address] [place], [place]  [phone] [facility] [address] [place], [place]  [phone] [facility] [place], [place]  [phone] [facility], [address] [place], [place]  [phone] [facility] [address] [place], [place]  [facility]  Womens health and family planning [phone] [address] [place], [place]  [facility] of Social Services  Child care  Emergency assistance for housing and Kimberly-Clarkutilities  Food stamps  Medicaid [phone] [address], [place], [place] [id number]   [facility]    Ages 6218 and older  Hours: Mondays and Thursdays, 7:00 am  - 9:00 am Patients are seen on a first come, first served basis. [phone], [phone]. [phone] [place], [place]  [facility] of Social Services  Child care  Emergency assistance for housing and Kimberly-Clarkutilities  Food stamps  Medicaid [phone] [address] [place], [place] [id number]  [facility] [employer] [facility]  Medication assistance  Rental assistance  Food pantry  Medication assistance  Housing assistance  Emergency food distribution  Utility assistance [phone] [address] [place], [place]  [phone] 651 9030  [address] [place], [place] [phone] Hours: Tuesdays and Thursdays from 9am - 12 noon by appointment only  [phone] [address] [place], [place] [id number]  [facility] - Lanae Boastlara F. Gunn   Accepts private insurance, [place]are, and [place]icaid.  Payment is based on a sliding scale for those without insurance.  Hours: Mondays, Tuesdays and Thursdays, 8:30 am - 5:30 pm.   [phone] [address] [place], [place]  [facility] - Family Medicine at [facility]    Accepts private insurance, [place]are, and [place]icaid.  Payment is based on a sliding scale for those without insurance. [phone] [address] [place], [place]  [facility] - Pediatrics at E. Scientist, research (physical sciences)[place]  Accepts private insurance, [employer] EntertainmentMedicare, and [place]icaid.  Payment is based on a sliding scale for those without insurance [phone] [address], [place], [place]  [facility] - Pediatrics at [place]  Accepts private insurance, [place], and [place].  Payment is based on a sliding scale for those without insurance. [phone] [address] [place], [place]  [facility] - Pediatrics at [facility]  Accepts private insurance, [place]are, and [place]icaid.  Payment is based on a sliding scale for those without insurance. [phone], [phone]. [phone] [place], [place].    [facility]  Maternity care.  Accepts Medicaid and self-pay. [phone] [address]  [place], [place]
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
## 2016-06-16 NOTE — ED Notes (Signed)
Pt in paper scrubs and wanded by security at triage, belongings removed.

## 2016-06-16 NOTE — ED Notes (Signed)
Pt reports having depression and thoughts of suicide recently, denies any attempts. Has history of same and has been out of meds x 1-2 weeks. Calm and cooperative at triage.

## 2020-01-01 DIAGNOSIS — D5700 Hb-SS disease with crisis, unspecified: Secondary | ICD-10-CM

## 2020-01-01 NOTE — ED Provider Notes (Signed)
EMERGENCY DEPARTMENT HISTORY AND PHYSICAL EXAM  ?    Date: 01/01/2020  Patient Name: Yolanda Weber    History of Presenting Illness    No chief complaint on file.      History Provided By: Patient    HPI: Yolanda Weber, 49 y.o. female with a past medical history significant sickle cell disease presents to the ED with cc of diffuse pain getting worse over the past 2 days.  She took one oxycodone yesterday.  That is her last pill about #30 prescription.  Follow-up appointment with her PCP is 1 week from now.    She says this is typical of her crisis pain.  The worst pain is in her hips but she has pain all over.  She denies fever, cough or lower urinary symptoms.  She rates the pain 9 out of 10 severity.      There are no other complaints, changes, or physical findings at this time.    PCP: No primary care provider on file.    Current Facility-Administered Medications:  ???  sodium chloride 0.9 % bolus infusion 1,000 mL, 1,000 mL, IntraVENous, ONCE, Halden Phegley, Belia Heman, MD  ???  HYDROmorphone (PF) (DILAUDID) injection 0.5 mg, 0.5 mg, IntraVENous, ONCE, Timmothy Baranowski, Belia Heman, MD  ???  ondansetron (ZOFRAN) injection 4 mg, 4 mg, IntraVENous, ONCE, Tina Temme, Belia Heman, MD        Past History    Past Medical History:  No past medical history on file.    Past Surgical History:  No past surgical history on file.    Family History:  No family history on file.      Social History:  Social History   Tobacco Use     Smoking status: Not on file   Alcohol use: Not on file   Drug use: Not on file      Allergies:  Not on File      Review of Systems  @ROSBYAGE @    Physical Exam  @PHYEXAMBYAGE @    Diagnostic Study Results    Labs -  No results found for this or any previous visit (from the past 12 hour(s)).    Radiologic Studies -   No orders to display  CT Results  (Last 48 hours)   None     CXR Results  (Last 48 hours)   None       Medical Decision Making and ED Course  I am the first provider for this patient.    I reviewed the vital signs, available nursing  notes, past medical history, past surgical history, family history and social history.    Vital Signs-Reviewed the patient's vital signs.  Empty flowsheet group.      Records Reviewed: Nursing Notes    Provider Notes (Medical Decision Making):   Patient presents with sickle cell crisis.  No evidence of infection.  She has run out of oxycodone.    The patient presents with differential diagnosis of sickle cell crisis, sepsis.        ED Course:   Work-up did not reveal infection.    Initial assessment performed. The patients presenting problems have been discussed, and they are in agreement with the care plan formulated and outlined with them.  I have encouraged them to ask questions as they arise throughout their visit.              Disposition    Discharged        DISCHARGE PLAN:  1. There are no  discharge medications for this patient.    2. Follow-up Information    None    3.  Return to ED if worse     Diagnosis    Clinical Impression: Sickle cell crisis    Luther Redo, MD    Please note that this dictation was completed with Dragon, the computer voice recognition software.  Quite often unanticipated grammatical, syntax, homophones, and other interpretive errors are inadvertently transcribed by the computer software.  Please disregard these errors.  Please excuse any errors that have escaped final proofreading.  Thank you.    ?           No past medical history on file.    No past surgical history on file.      No family history on file.    Social History     Socioeconomic History   ??? Marital status: SINGLE     Spouse name: Not on file   ??? Number of children: Not on file   ??? Years of education: Not on file   ??? Highest education level: Not on file   Occupational History   ??? Not on file   Social Needs   ??? Financial resource strain: Not on file   ??? Food insecurity     Worry: Not on file     Inability: Not on file   ??? Transportation needs     Medical: Not on file     Non-medical: Not on file   Tobacco Use   ??? Smoking  status: Not on file   Substance and Sexual Activity   ??? Alcohol use: Not on file   ??? Drug use: Not on file   ??? Sexual activity: Not on file   Lifestyle   ??? Physical activity     Days per week: Not on file     Minutes per session: Not on file   ??? Stress: Not on file   Relationships   ??? Social Product manager on phone: Not on file     Gets together: Not on file     Attends religious service: Not on file     Active member of club or organization: Not on file     Attends meetings of clubs or organizations: Not on file     Relationship status: Not on file   ??? Intimate partner violence     Fear of current or ex partner: Not on file     Emotionally abused: Not on file     Physically abused: Not on file     Forced sexual activity: Not on file   Other Topics Concern   ??? Not on file   Social History Narrative   ??? Not on file         ALLERGIES: Patient has no allergy information on record.    Review of Systems   Constitutional: Negative.    HENT: Negative.    Eyes: Negative.    Respiratory: Negative.    Cardiovascular: Negative.    Gastrointestinal: Negative.    Endocrine: Negative.    Genitourinary: Negative.    Musculoskeletal: Positive for arthralgias and myalgias.   Neurological: Negative.    Hematological: Negative.    Psychiatric/Behavioral: Negative.        Vitals:    01/01/20 2223   BP: (!) 126/94   Pulse: 72   Resp: 18   Temp: 97.8 ??F (36.6 ??C)   SpO2: 98%   Weight: 86.2 kg (  190 lb)   Height: 5\' 3"  (1.6 m)            Physical Exam  Vitals signs and nursing note reviewed.   Constitutional:       General: She is in acute distress.      Appearance: Normal appearance.   HENT:      Head: Normocephalic and atraumatic.      Nose: Nose normal.      Mouth/Throat:      Mouth: Mucous membranes are moist.      Pharynx: Oropharynx is clear.   Eyes:      Extraocular Movements: Extraocular movements intact.      Conjunctiva/sclera: Conjunctivae normal.      Pupils: Pupils are equal, round, and reactive to light.   Neck:       Musculoskeletal: Normal range of motion and neck supple.   Cardiovascular:      Rate and Rhythm: Normal rate and regular rhythm.      Pulses: Normal pulses.      Heart sounds: Normal heart sounds.   Pulmonary:      Effort: Pulmonary effort is normal.      Breath sounds: Normal breath sounds.   Abdominal:      General: Abdomen is flat. Bowel sounds are normal.      Palpations: Abdomen is soft.   Musculoskeletal: Normal range of motion.   Skin:     General: Skin is warm and dry.   Neurological:      General: No focal deficit present.      Mental Status: She is alert and oriented to person, place, and time.   Psychiatric:         Mood and Affect: Mood normal.         Behavior: Behavior normal.          MDM  Number of Diagnoses or Management Options     Amount and/or Complexity of Data Reviewed  Clinical lab tests: reviewed  Tests in the radiology section of CPT??: reviewed    Risk of Complications, Morbidity, and/or Mortality  Presenting problems: high  Diagnostic procedures: moderate  Management options: moderate    Patient Progress  Patient progress: stable         Procedures

## 2020-01-01 NOTE — ED Provider Notes (Signed)
EMERGENCY DEPARTMENT HISTORY AND PHYSICAL EXAM  ?    Date: 01/01/2020  Patient Name: Yolanda Weber    History of Presenting Illness    No chief complaint on file.      History Provided By: Patient    HPI: Yolanda Weber, 49 y.o. female with a past medical history significant sickle cell disease presents to the ED with cc of diffuse pain getting worse over the past 2 days.  She took one oxycodone yesterday.  That is her last pill about #30 prescription.  Follow-up appointment with her PCP is 1 week from now.    She says this is typical of her crisis pain.  The worst pain is in her hips but she has pain all over.  She denies fever, cough or lower urinary symptoms.  She rates the pain 9 out of 10 severity.      There are no other complaints, changes, or physical findings at this time.    PCP: No primary care provider on file.    Current Facility-Administered Medications:  ???  sodium chloride 0.9 % bolus infusion 1,000 mL, 1,000 mL, IntraVENous, ONCE, Shellyann Wandrey, Belia Heman, MD  ???  HYDROmorphone (PF) (DILAUDID) injection 0.5 mg, 0.5 mg, IntraVENous, ONCE, Brainard Highfill, Belia Heman, MD  ???  ondansetron (ZOFRAN) injection 4 mg, 4 mg, IntraVENous, ONCE, Lachae Hohler, Belia Heman, MD        Past History    Past Medical History:  No past medical history on file.    Past Surgical History:  No past surgical history on file.    Family History:  No family history on file.      Social History:  Social History   Tobacco Use     Smoking status: Not on file   Alcohol use: Not on file   Drug use: Not on file      Allergies:  Not on File      Review of Systems  @ROSBYAGE @    Physical Exam  @PHYEXAMBYAGE @    Diagnostic Study Results    Labs -  No results found for this or any previous visit (from the past 12 hour(s)).    Radiologic Studies -   No orders to display  CT Results  (Last 48 hours)   None     CXR Results  (Last 48 hours)   None       Medical Decision Making and ED Course  I am the first provider for this patient.     I reviewed the vital signs, available nursing notes, past medical history, past surgical history, family history and social history.    Vital Signs-Reviewed the patient's vital signs.  Empty flowsheet group.      Records Reviewed: Nursing Notes    Provider Notes (Medical Decision Making):   Patient presents with sickle cell crisis.  No evidence of infection.  She has run out of oxycodone.    The patient presents with differential diagnosis of sickle cell crisis, sepsis.        ED Course:   Work-up did not reveal infection.    Initial assessment performed. The patients presenting problems have been discussed, and they are in agreement with the care plan formulated and outlined with them.  I have encouraged them to ask questions as they arise throughout their visit.              Disposition    Discharged        DISCHARGE PLAN:  1. There are no  discharge medications for this patient.    2. Follow-up Information    None    3.  Return to ED if worse     Diagnosis    Clinical Impression: Sickle cell crisis    Norva Karvonen, MD    Please note that this dictation was completed with Dragon, the computer voice recognition software.  Quite often unanticipated grammatical, syntax, homophones, and other interpretive errors are inadvertently transcribed by the computer software.  Please disregard these errors.  Please excuse any errors that have escaped final proofreading.  Thank you.    ?           No past medical history on file.    No past surgical history on file.      No family history on file.    Social History     Socioeconomic History   ??? Marital status: SINGLE     Spouse name: Not on file   ??? Number of children: Not on file   ??? Years of education: Not on file   ??? Highest education level: Not on file   Occupational History   ??? Not on file   Social Needs   ??? Financial resource strain: Not on file   ??? Food insecurity     Worry: Not on file     Inability: Not on file   ??? Transportation needs     Medical: Not on file      Non-medical: Not on file   Tobacco Use   ??? Smoking status: Not on file   Substance and Sexual Activity   ??? Alcohol use: Not on file   ??? Drug use: Not on file   ??? Sexual activity: Not on file   Lifestyle   ??? Physical activity     Days per week: Not on file     Minutes per session: Not on file   ??? Stress: Not on file   Relationships   ??? Social Wellsite geologist on phone: Not on file     Gets together: Not on file     Attends religious service: Not on file     Active member of club or organization: Not on file     Attends meetings of clubs or organizations: Not on file     Relationship status: Not on file   ??? Intimate partner violence     Fear of current or ex partner: Not on file     Emotionally abused: Not on file     Physically abused: Not on file     Forced sexual activity: Not on file   Other Topics Concern   ??? Not on file   Social History Narrative   ??? Not on file         ALLERGIES: Patient has no allergy information on record.    Review of Systems   Constitutional: Negative.    HENT: Negative.    Eyes: Negative.    Respiratory: Negative.    Cardiovascular: Negative.    Gastrointestinal: Negative.    Endocrine: Negative.    Genitourinary: Negative.    Musculoskeletal: Positive for arthralgias and myalgias.   Neurological: Negative.    Hematological: Negative.    Psychiatric/Behavioral: Negative.        Vitals:    01/01/20 2223   BP: (!) 126/94   Pulse: 72   Resp: 18   Temp: 97.8 ??F (36.6 ??C)   SpO2: 98%   Weight: 86.2 kg (  190 lb)   Height: 5\' 3"  (1.6 m)            Physical Exam  Vitals signs and nursing note reviewed.   Constitutional:       General: She is in acute distress.      Appearance: Normal appearance.   HENT:      Head: Normocephalic and atraumatic.      Nose: Nose normal.      Mouth/Throat:      Mouth: Mucous membranes are moist.      Pharynx: Oropharynx is clear.   Eyes:      Extraocular Movements: Extraocular movements intact.      Conjunctiva/sclera: Conjunctivae normal.       Pupils: Pupils are equal, round, and reactive to light.   Neck:      Musculoskeletal: Normal range of motion and neck supple.   Cardiovascular:      Rate and Rhythm: Normal rate and regular rhythm.      Pulses: Normal pulses.      Heart sounds: Normal heart sounds.   Pulmonary:      Effort: Pulmonary effort is normal.      Breath sounds: Normal breath sounds.   Abdominal:      General: Abdomen is flat. Bowel sounds are normal.      Palpations: Abdomen is soft.   Musculoskeletal: Normal range of motion.   Skin:     General: Skin is warm and dry.   Neurological:      General: No focal deficit present.      Mental Status: She is alert and oriented to person, place, and time.   Psychiatric:         Mood and Affect: Mood normal.         Behavior: Behavior normal.          MDM  Number of Diagnoses or Management Options     Amount and/or Complexity of Data Reviewed  Clinical lab tests: reviewed  Tests in the radiology section of CPT??: reviewed    Risk of Complications, Morbidity, and/or Mortality  Presenting problems: high  Diagnostic procedures: moderate  Management options: moderate    Patient Progress  Patient progress: stable         Procedures

## 2020-01-02 ENCOUNTER — Inpatient Hospital Stay
Admit: 2020-01-02 | Discharge: 2020-01-02 | Disposition: A | Discharge status: Home / Self Care | Payer: MEDICAID | Attending: Emergency Medicine

## 2020-01-02 LAB — CBC WITH AUTOMATED DIFF
ABS. BASOPHILS: 0 10*3/uL (ref 0.0–0.2)
ABS. EOSINOPHILS: 0.2 10*3/uL (ref 0.0–0.7)
ABS. LYMPHOCYTES: 5.4 10*3/uL — ABNORMAL HIGH (ref 1.0–4.8)
ABS. MONOCYTES: 0.7 10*3/uL (ref 0.2–2.4)
ABS. NEUTROPHILS: 5.2 10*3/uL (ref 1.8–7.7)
ABSOLUTE NRBC: 0.01 10*3/uL
BASOPHILS: 0 % (ref 0.0–2.5)
EOSINOPHILS: 2 % (ref 0.9–2.9)
HCT: 37.9 % (ref 36–46)
HGB: 13 g/dL — ABNORMAL LOW (ref 13.5–17.5)
LYMPHOCYTES: 47 % (ref 20.5–51.1)
MCH: 29.3 PG — ABNORMAL LOW (ref 31–34)
MCHC: 34.3 g/dL (ref 31.0–36.0)
MCV: 85.4 FL (ref 80–100)
MONOCYTES: 6 % (ref 1.7–9.3)
MPV: 7.6 FL (ref 6.5–11.5)
NEUTROPHILS: 45 % (ref 42–75)
NRBC: 10 PER 100 WBC
PLATELET: 400 10*3/uL
RBC: 4.43 M/uL — ABNORMAL LOW (ref 4.50–5.90)
RDW: 14.3 % (ref 11.5–14.5)
WBC: 11.7 10*3/uL — ABNORMAL HIGH (ref 4.4–11.3)

## 2020-01-02 LAB — METABOLIC PANEL, COMPREHENSIVE
A-G Ratio: 0.8 — ABNORMAL LOW (ref 1.1–2.2)
ALT (SGPT): 44 U/L (ref 12–78)
AST (SGOT): 26 U/L (ref 15–37)
Albumin: 3.2 g/dL — ABNORMAL LOW (ref 3.5–5.0)
Alk. phosphatase: 111 U/L (ref 45–117)
Anion gap: 9 mmol/L (ref 5–15)
BUN/Creatinine ratio: 19 (ref 12–20)
BUN: 16 mg/dL (ref 6–20)
Bilirubin, total: 0.3 mg/dL (ref 0.2–1.0)
CO2: 26 mmol/L (ref 21–32)
Calcium: 9.2 mg/dL (ref 8.5–10.1)
Chloride: 106 mmol/L (ref 97–108)
Creatinine: 0.84 mg/dL (ref 0.55–1.02)
GFR est AA: >60 mL/min/{1.73_m2} (ref >60)
GFR est non-AA: >60 mL/min/{1.73_m2} (ref >60)
Globulin: 3.9 g/dL (ref 2.0–4.0)
Glucose: 98 mg/dL (ref 65–100)
Potassium: 3.4 mmol/L — ABNORMAL LOW (ref 3.5–5.1)
Protein, total: 7.1 g/dL (ref 6.4–8.2)
Sodium: 141 mmol/L (ref 136–145)

## 2020-01-02 LAB — RETICULOCYTE COUNT
Absolute Retic Cnt.: 0.065 M/ul (ref 0.02–0.13)
Absolute Retic Cnt.: 0.065 M/ul (ref 0.02–0.13)
Retic Ct Pct: 1.5 % (ref 0.5–2.6)
Reticulocyte count: 1.5 % (ref 0.5–2.6)

## 2020-01-02 LAB — CBC WITH AUTO DIFFERENTIAL
Basophils %: 0 % (ref 0.0–2.5)
Basophils Absolute: 0 10*3/uL (ref 0.0–0.2)
Eosinophils %: 2 % (ref 0.9–2.9)
Eosinophils Absolute: 0.2 10*3/uL (ref 0.0–0.7)
Hematocrit: 37.9 % (ref 36–46)
Hemoglobin: 13 g/dL — ABNORMAL LOW (ref 13.5–17.5)
Lymphocytes %: 47 % (ref 20.5–51.1)
Lymphocytes Absolute: 5.4 10*3/uL — ABNORMAL HIGH (ref 1.0–4.8)
MCH: 29.3 PG — ABNORMAL LOW (ref 31–34)
MCHC: 34.3 g/dL (ref 31.0–36.0)
MCV: 85.4 FL (ref 80–100)
MPV: 7.6 FL (ref 6.5–11.5)
Monocytes %: 6 % (ref 1.7–9.3)
Monocytes Absolute: 0.7 10*3/uL (ref 0.2–2.4)
NRBC Absolute: 0.01 10*3/uL
Neutrophils %: 45 % (ref 42–75)
Neutrophils Absolute: 5.2 10*3/uL (ref 1.8–7.7)
Nucleated RBCs: 10 PER 100 WBC
Platelets: 400 10*3/uL
RBC: 4.43 M/uL — ABNORMAL LOW (ref 4.50–5.90)
RDW: 14.3 % (ref 11.5–14.5)
WBC: 11.7 10*3/uL — ABNORMAL HIGH (ref 4.4–11.3)

## 2020-01-02 LAB — COMPREHENSIVE METABOLIC PANEL
ALT: 44 U/L (ref 12–78)
AST: 26 U/L (ref 15–37)
Albumin/Globulin Ratio: 0.8 — ABNORMAL LOW (ref 1.1–2.2)
Albumin: 3.2 g/dL — ABNORMAL LOW (ref 3.5–5.0)
Alkaline Phosphatase: 111 U/L (ref 45–117)
Anion Gap: 9 mmol/L (ref 5–15)
BUN: 16 mg/dL (ref 6–20)
Bun/Cre Ratio: 19 (ref 12–20)
CO2: 26 mmol/L (ref 21–32)
Calcium: 9.2 mg/dL (ref 8.5–10.1)
Chloride: 106 mmol/L (ref 97–108)
Creatinine: 0.84 mg/dL (ref 0.55–1.02)
EGFR IF NonAfrican American: >60 mL/min/{1.73_m2} (ref >60)
GFR African American: >60 mL/min/{1.73_m2} (ref >60)
Globulin: 3.9 g/dL (ref 2.0–4.0)
Glucose: 98 mg/dL (ref 65–100)
Potassium: 3.4 mmol/L — ABNORMAL LOW (ref 3.5–5.1)
Sodium: 141 mmol/L (ref 136–145)
Total Bilirubin: 0.3 mg/dL (ref 0.2–1.0)
Total Protein: 7.1 g/dL (ref 6.4–8.2)

## 2020-01-02 MED ORDER — HYDROMORPHONE 1 MG/ML INJECTION SOLUTION
1 mg/mL | INTRAMUSCULAR | Status: AC
Start: 2020-01-02 — End: 2020-01-02
  Administered 2020-01-02: 06:00:00 via INTRAVENOUS

## 2020-01-02 MED ORDER — HYDROMORPHONE 1 MG/ML INJECTION SOLUTION
1 mg/mL | Freq: Once | INTRAMUSCULAR | Status: AC
Start: 2020-01-02 — End: 2020-01-01
  Administered 2020-01-02: 05:00:00 via INTRAVENOUS

## 2020-01-02 MED ORDER — ONDANSETRON (PF) 4 MG/2 ML INJECTION
4 mg/2 mL | Freq: Once | INTRAMUSCULAR | Status: AC
Start: 2020-01-02 — End: 2020-01-01
  Administered 2020-01-02: 04:00:00 via INTRAVENOUS

## 2020-01-02 MED ORDER — SODIUM CHLORIDE 0.9% BOLUS IV
0.9 % | Freq: Once | INTRAVENOUS | Status: AC
Start: 2020-01-02 — End: 2020-01-02
  Administered 2020-01-02: 04:00:00 via INTRAVENOUS

## 2020-01-02 MED ORDER — NALOXONE 4 MG/ACTUATION NASAL SPRAY
4 mg/actuation | NASAL | 0 refills | Status: AC
Start: 2020-01-02 — End: ?

## 2020-01-02 MED ORDER — OXYCODONE-ACETAMINOPHEN 5 MG-325 MG TAB
5-325 mg | ORAL_TABLET | Freq: Four times a day (QID) | ORAL | 0 refills | Status: AC | PRN
Start: 2020-01-02 — End: 2020-01-05

## 2020-01-02 MED ORDER — KETOROLAC TROMETHAMINE 30 MG/ML INJECTION
30 mg/mL (1 mL) | Freq: Once | INTRAMUSCULAR | Status: AC
Start: 2020-01-02 — End: 2020-01-02
  Administered 2020-01-02: 06:00:00 via INTRAVENOUS

## 2020-01-02 MED FILL — HYDROMORPHONE 1 MG/ML INJECTION SOLUTION: 1 mg/mL | INTRAMUSCULAR | Qty: 1

## 2020-01-02 MED FILL — ONDANSETRON (PF) 4 MG/2 ML INJECTION: 4 mg/2 mL | INTRAMUSCULAR | Qty: 2

## 2020-01-02 MED FILL — KETOROLAC TROMETHAMINE 30 MG/ML INJECTION: 30 mg/mL (1 mL) | INTRAMUSCULAR | Qty: 1

## 2020-01-02 MED FILL — SODIUM CHLORIDE 0.9 % IV: INTRAVENOUS | Qty: 1000

## 2020-01-02 NOTE — ED Notes (Signed)
I have reviewed discharge instructions with the patient.  The patient verbalized understanding.  Pt given written prescription for percocet and narcan.

## 2020-01-02 NOTE — ED Notes (Signed)
I have reviewed discharge instructions with the patient.  The patient verbalized understanding.  Pt given written prescription for percocet and narcan.

## 2021-08-14 ENCOUNTER — Inpatient Hospital Stay
Admit: 2021-08-14 | Discharge: 2021-08-14 | Disposition: A | Discharge status: Home / Self Care | Payer: MEDICAID | Attending: Emergency Medicine

## 2021-08-14 DIAGNOSIS — M5442 Lumbago with sciatica, left side: Secondary | ICD-10-CM

## 2021-08-14 MED ORDER — OXYCODONE 10 MG TAB
10 mg | ORAL | Status: AC
Start: 2021-08-14 — End: 2021-08-14
  Administered 2021-08-14: 16:00:00 via ORAL

## 2021-08-14 MED ORDER — LIDOCAINE 5 % (700 MG/PATCH) ADHESIVE PATCH
5 % | CUTANEOUS | 0 refills | Status: AC
Start: 2021-08-14 — End: ?

## 2021-08-14 MED ORDER — IBUPROFEN 400 MG TAB
400 mg | ORAL_TABLET | Freq: Four times a day (QID) | ORAL | 0 refills | Status: AC | PRN
Start: 2021-08-14 — End: ?

## 2021-08-14 MED ORDER — ACETAMINOPHEN 325 MG TABLET
325 mg | ORAL_TABLET | ORAL | 0 refills | Status: AC | PRN
Start: 2021-08-14 — End: ?

## 2021-08-14 MED ORDER — PREDNISONE 20 MG TAB
20 mg | ORAL | Status: AC
Start: 2021-08-14 — End: 2021-08-14
  Administered 2021-08-14: 16:00:00 via ORAL

## 2021-08-14 MED ORDER — PREDNISONE 10 MG TABLETS IN A DOSE PACK
10 mg | ORAL | 0 refills | Status: AC
Start: 2021-08-14 — End: 2021-08-20

## 2021-08-14 MED ORDER — METHOCARBAMOL 750 MG TAB
750 mg | ORAL_TABLET | Freq: Three times a day (TID) | ORAL | 0 refills | Status: AC | PRN
Start: 2021-08-14 — End: 2021-08-28

## 2021-08-14 MED ORDER — OXYCODONE 5 MG TAB
5 mg | ORAL_TABLET | Freq: Four times a day (QID) | ORAL | 0 refills | Status: AC | PRN
Start: 2021-08-14 — End: 2021-08-17

## 2021-08-14 MED ORDER — ACETAMINOPHEN 500 MG TAB
500 mg | Freq: Once | ORAL | Status: AC
Start: 2021-08-14 — End: 2021-08-14
  Administered 2021-08-14: 16:00:00 via ORAL

## 2021-08-14 MED FILL — OXYCODONE 10 MG TAB: 10 mg | ORAL | Qty: 1

## 2021-08-14 MED FILL — ACETAMINOPHEN 500 MG TAB: 500 mg | ORAL | Qty: 2

## 2021-08-14 MED FILL — PREDNISONE 20 MG TAB: 20 mg | ORAL | Qty: 3

## 2021-08-14 NOTE — ED Provider Notes (Signed)
ED Provider Notes by Burnard Hawthorne, MD at 08/14/21 1314                Author: Burnard Hawthorne, MD  Service: --  Author Type: Physician       Filed: 08/14/21 1322  Date of Service: 08/14/21 1314  Status: Signed          Editor: Burnard Hawthorne, MD (Physician)               EMERGENCY DEPARTMENT HISTORY AND PHYSICAL EXAM           Date: 08/14/2021   Patient Name: Yolanda Weber        History of Presenting Illness          Chief Complaint       Patient presents with        ?  Back Pain           History Provided By: Patient      HPI: Yolanda Weber, 50 y.o. female with a significant past medical history of sickle cell trait, fibromyalgia and hypertension presents to the ED  with bilateral lower back pain that radiates down both legs to her ankles.  She reports a history of sciatica and bulging disc and states that she has had MRIs in the past for the same symptoms.  She states that she recently moved here from [place]  and is in the process of obtaining primary care and pain management however for the past 4 days her symptoms have been worsening.  She states that she has appointment on Tuesday with primary care doctor appointment with pain management in October.  No  difficulty with urinating no urinary symptoms no saddle anesthesia or bowel problems.      Per chart review patient was seen yesterday at a nearby emergency department and prescribed muscle relaxers and Toradol.  She discussed this with patient and she states that she was seen by the medications were not working.  We discussed more extensive  work-up here but she declines.  She does not have any red flag symptoms clinically.      She notes that she has pain medications that were prescribed by her doctor and [facility] however she has run out of these medications.      There are no other complaints, changes, or physical findings at this time.      PCP: Other, Phys, MD        No current facility-administered medications on file  prior to encounter.          Current Outpatient Medications on File Prior to Encounter          Medication  Sig  Dispense  Refill           ?  [DISCONTINUED] cyclobenzaprine (FLEXERIL) 5 mg tablet  take 1 tablet by mouth three times a day if needed for muscle spasm         ?  [DISCONTINUED] tiZANidine (ZANAFLEX) 4 mg tablet  take 1 tablet by mouth three times a day if needed for muscle spasm               ?  naloxone (NARCAN) 4 mg/actuation nasal spray  Use 1 spray intranasally, then discard. Repeat with new spray every 2 min as needed for opioid overdose symptoms, alternating nostrils.  2 Each  0           ?  hydroxyurea (HYDREA) 500 mg capsule  Take 500 mg  by mouth daily.         ?  clonazePAM (KlonoPIN) 1 mg tablet  Take 1 mg by mouth two (2) times a day.         ?  aripiprazole (ABILIFY PO)  Take 1 Tab by mouth daily.         ?  escitalopram oxalate (LEXAPRO) 5 mg tablet  Take 5 mg by mouth daily.               ?  [DISCONTINUED] oxyCODONE IR (OXY-IR) 15 mg immediate release tablet  Take 15 mg by mouth every eight (8) hours as needed for Pain.                 Past History        Past Medical History:     Past Medical History:        Diagnosis  Date         ?  Bulging lumbar disc       ?  Fibromyalgia       ?  Sciatica           ?  Sickle cell anemia (HCC)             Past Surgical History:     Past Surgical History:         Procedure  Laterality  Date          ?  HX CHOLECYSTECTOMY         ?  HX CYST REMOVAL  Right            Breast          ?  HX DILATION AND CURETTAGE               Family History:   History reviewed. No pertinent family history.      Social History:     Social History          Tobacco Use         ?  Smoking status:  Some Days     ?  Smokeless tobacco:  Never        ?  Tobacco comments:             1 pack every 3 days       Substance Use Topics         ?  Alcohol use:  Not Currently         ?  Drug use:  Never           Allergies:     Allergies        Allergen  Reactions         ?  Ibuprofen   Nausea and Vomiting         ?  Shellfish Derived  Hives             Review of Systems     Review of Systems    Constitutional: Negative.     HENT: Negative.      Eyes: Negative.     Respiratory: Negative.      Cardiovascular: Negative.     Gastrointestinal: Negative.     Endocrine: Negative.     Genitourinary: Negative.     Musculoskeletal:  Positive for arthralgias, back pain  and myalgias. Negative for neck pain and neck stiffness.    Skin:  Negative for color change and rash.    Neurological: Negative.  Negative for dizziness,  weakness and numbness.    Hematological: Negative.     Psychiatric/Behavioral: Negative.           Physical Exam     Physical Exam   Vitals and nursing note reviewed.    Constitutional:        General: She is not in acute distress.      Appearance: Normal appearance. She is not ill-appearing.    HENT:       Head: Normocephalic and atraumatic.       Right Ear: External ear normal.       Left Ear: External ear normal.       Nose: Nose normal.       Mouth/Throat:       Mouth: Mucous membranes are moist.       Pharynx: Oropharynx is clear.    Eyes:       Conjunctiva/sclera: Conjunctivae normal.       Pupils: Pupils are equal, round, and reactive to light.     Cardiovascular:       Rate and Rhythm: Normal rate and regular rhythm.       Pulses: Normal pulses.       Heart sounds: Normal heart sounds.    Pulmonary:       Effort: Pulmonary effort is normal.       Breath sounds: Normal breath sounds.     Abdominal:       Palpations: Abdomen is soft.     Musculoskeletal:          General: No swelling, tenderness or deformity. Normal range of motion.       Cervical back: Normal range of motion and neck supple.       Comments: Bilateral paraspinous lumbar tenderness to palpation.  Positive straight leg raise bilaterally right greater than left.  Sensation intact distally lower extremities.  Sensation to soft  touch.  DP and PT pulses intact.  Plantar and dorsiflexion intact.  5 out of 5 strength     Skin:      General: Skin is warm and dry.       Capillary Refill: Capillary refill takes less than 2 seconds.    Neurological:       General: No focal deficit present.       Mental Status: She is alert and oriented to person, place, and time. Mental status is at baseline.    Psychiatric:          Mood and Affect: Mood normal.          Thought Content: Thought content normal.          Judgment: Judgment normal.            Medical Decision Making and ED Course     Differential Diagnosis & Medical Decision Making Provider Note:    Patient is presenting for bilateral lumbar back pain radiating into both legs.  She declines more in-depth work-up here.  She otherwise appears well.  She reports some relief with oral medications in  the ED.  We again discussed at least urinalysis which patient declined stating she has a longstanding history of the same.  There are no red flag symptoms as previously mentioned.  Will give follow-up with Ortho to evaluate patient has expressed interest  in possible surgical repair.  She states that she has upcoming appointments with pain management as well as her primary care.  Will prescribe short course of pain medication given patient's reported follow-up on Tuesday.      -  I am the first provider for this patient.  I reviewed the vital signs, available nursing notes, past medical history, past surgical history, family history and social history. The patients presenting problems have been discussed, and they are in agreement  with the care plan formulated and outlined with them.  I have encouraged them to ask questions as they arise throughout their visit.      Vital Signs-Reviewed the patient's vital signs.   Patient Vitals for the past 12 hrs:            Temp  Pulse  Resp  BP  SpO2            08/14/21 1124  98 ??F (36.7 ??C)  68  18  (!) 142/82  99 %           ED Course:             Disposition     Disposition: Condition stable   DC- Adult Discharges: All of the diagnostic tests were  reviewed and questions answered. Diagnosis, care plan and treatment options were discussed.  The patient understands the instructions and will follow up as directed. The patients results have been  reviewed with them.  They have been counseled regarding their diagnosis.  The patient verbally convey understanding and agreement of the signs, symptoms, diagnosis, treatment and prognosis and additionally agrees to follow up as recommended with their  PCP in 24 - 48 hours.  They also agree with the care-plan and convey that all of their questions have been answered.  I have also put together some discharge instructions for them that include: 1) educational information regarding their diagnosis, 2)  how to care for their diagnosis at home, as well a 3) list of reasons why they would want to return to the ED prior to their follow-up appointment, should their condition change.   DC- Pain Control DC Home plan: Tylenol, Narcotic pain medication, Analgesics, and Referral Orthopedics      DISCHARGE PLAN:   1.      Current Discharge Medication List                 START taking these medications          Details        predniSONE (STERAPRED DS) 10 mg dose pack  6 Day pack.  Please take per package instructions.   Qty: 1 Dose Pack, Refills: 0               methocarbamoL (ROBAXIN) 750 mg tablet  Take 1 Tablet by mouth three (3) times daily as needed for Muscle Spasm(s) for up to 14 days.   Qty: 15 Tablet, Refills: 0               lidocaine (LIDODERM) 5 %  Apply patch to the affected area for 12 hours a day and remove for 12 hours a day.   Qty: 1 Each, Refills: 0               oxyCODONE IR (Roxicodone) 5 mg immediate release tablet  Take 1 Tablet by mouth every six (6) hours as needed for Pain for up to 3 days. Max Daily Amount: 20 mg.   Qty: 12 Tablet, Refills: 0          Associated Diagnoses: Acute bilateral low back pain with left-sided sciatica               acetaminophen (TYLENOL) 325 mg tablet  Take 2 Tablets by mouth every  four (4) hours as needed for Pain.   Qty: 20 Tablet, Refills: 0               ibuprofen (MOTRIN) 400 mg tablet  Take 1 Tablet by mouth every six (6) hours as needed for Pain.   Qty: 20 Tablet, Refills: 0                        CONTINUE these medications which have NOT CHANGED          Details        naloxone (NARCAN) 4 mg/actuation nasal spray  Use 1 spray intranasally, then discard. Repeat with new spray every 2 min as needed for opioid overdose symptoms, alternating nostrils.   Qty: 2 Each, Refills: 0               hydroxyurea (HYDREA) 500 mg capsule  Take 500 mg by mouth daily.               clonazePAM (KlonoPIN) 1 mg tablet  Take 1 mg by mouth two (2) times a day.               aripiprazole (ABILIFY PO)  Take 1 Tab by mouth daily.               escitalopram oxalate (LEXAPRO) 5 mg tablet  Take 5 mg by mouth daily.                        STOP taking these medications                  cyclobenzaprine (FLEXERIL) 5 mg tablet  Comments:    Reason for Stopping:                      tiZANidine (ZANAFLEX) 4 mg tablet  Comments:    Reason for Stopping:                          2.      Follow-up Information                  Follow up With  Specialties  Details  Why  Contact Info                  Call in 1 day  Please call your regular physician to schedule followup within the next 2 days.                 Please return to this Emergency Department if your symptoms worsen.                Eulis Canner, MD  Orthopedic Surgery  Go in 1 week  If symptoms persist or worsen  [address]   [place] [address]   [phone]                     3.  Return to ED if worse    4.      Current Discharge Medication List                 START taking these medications          Details        predniSONE (STERAPRED DS) 10 mg dose pack  6 Day pack.  Please take  per package instructions.   Qty: 1 Dose Pack, Refills: 0   Start date: 08/14/2021, End date: 08/20/2021               methocarbamoL (ROBAXIN) 750 mg tablet  Take 1 Tablet by mouth  three (3) times daily as needed for Muscle Spasm(s) for up to 14 days.   Qty: 15 Tablet, Refills: 0   Start date: 08/14/2021, End date: 08/28/2021               lidocaine (LIDODERM) 5 %  Apply patch to the affected area for 12 hours a day and remove for 12 hours a day.   Qty: 1 Each, Refills: 0   Start date: 08/14/2021               oxyCODONE IR (Roxicodone) 5 mg immediate release tablet  Take 1 Tablet by mouth every six (6) hours as needed for Pain for up to 3 days. Max Daily Amount: 20 mg.   Qty: 12 Tablet, Refills: 0   Start date: 08/14/2021, End date: 08/17/2021          Associated Diagnoses: Acute bilateral low back pain with left-sided sciatica               acetaminophen (TYLENOL) 325 mg tablet  Take 2 Tablets by mouth every four (4) hours as needed for Pain.   Qty: 20 Tablet, Refills: 0   Start date: 08/14/2021               ibuprofen (MOTRIN) 400 mg tablet  Take 1 Tablet by mouth every six (6) hours as needed for Pain.   Qty: 20 Tablet, Refills: 0   Start date: 08/14/2021                           Diagnosis/Clinical Impression        Clinical Impression:       1.  Acute bilateral low back pain with left-sided sciatica            Attestations: I,  Burnard Hawthorne, MD, am the primary clinician of record.      Please note that this dictation was completed with Dragon, the computer voice recognition software.  Quite often unanticipated grammatical, syntax, homophones, and other interpretive errors are inadvertently  transcribed by the computer software.  Please disregard these errors.  Please excuse any errors that have escaped final proofreading.  Thank you.

## 2021-08-14 NOTE — ED Notes (Signed)
Has hx of bulging disc and sciatica, taking prescribed muscle relaxers, past couple days having pain in back that radiated down hips and legs, has appt on Tuesday with PCP and trying to get appt to see pain doctor

## 2021-09-10 ENCOUNTER — Emergency Department: Admit: 2021-09-10 | Payer: MEDICAID | Primary: Family Medicine

## 2021-09-10 ENCOUNTER — Inpatient Hospital Stay
Admit: 2021-09-10 | Discharge: 2021-09-10 | Disposition: A | Discharge status: Home / Self Care | Payer: MEDICAID | Attending: Emergency Medicine

## 2021-09-10 DIAGNOSIS — S39012A Strain of muscle, fascia and tendon of lower back, initial encounter: Secondary | ICD-10-CM

## 2021-09-10 MED ORDER — KETOROLAC TROMETHAMINE 60 MG/2 ML IM
60 mg/2 mL | INTRAMUSCULAR | Status: AC
Start: 2021-09-10 — End: 2021-09-10
  Administered 2021-09-10: 16:00:00 via INTRAMUSCULAR

## 2021-09-10 MED ORDER — METHYLPREDNISOLONE (PF) 125 MG/2 ML IJ SOLR
125 mg/2 mL | INTRAMUSCULAR | Status: AC
Start: 2021-09-10 — End: 2021-09-10
  Administered 2021-09-10: 16:00:00 via INTRAMUSCULAR

## 2021-09-10 MED ORDER — HYDROCODONE-ACETAMINOPHEN 7.5 MG-325 MG TAB
Freq: Once | ORAL | Status: AC
Start: 2021-09-10 — End: 2021-09-10
  Administered 2021-09-10: 16:00:00 via ORAL

## 2021-09-10 MED ORDER — PREDNISONE 20 MG TAB
20 mg | ORAL_TABLET | Freq: Every day | ORAL | 0 refills | Status: AC
Start: 2021-09-10 — End: 2021-09-20

## 2021-09-10 MED FILL — HYDROCODONE-ACETAMINOPHEN 7.5 MG-325 MG TAB: ORAL | Qty: 1

## 2021-09-10 MED FILL — KETOROLAC TROMETHAMINE 60 MG/2 ML IM: 60 mg/2 mL | INTRAMUSCULAR | Qty: 2

## 2021-09-10 MED FILL — SOLU-MEDROL (PF) 125 MG/2 ML SOLUTION FOR INJECTION: 125 mg/2 mL | INTRAMUSCULAR | Qty: 2

## 2021-09-10 NOTE — ED Notes (Signed)
Pt reports back pain that began yesterday after lifting boxes. Pt reports hx of sciatica. Pt states she took a muscle relaxant with no relief.

## 2021-09-10 NOTE — ED Provider Notes (Signed)
ED Provider Notes by Domingo Dimes, MD at 09/10/21 1215                Author: Domingo Dimes, MD  Service: Emergency Medicine  Author Type: Physician       Filed: 09/10/21 1309  Date of Service: 09/10/21 1215  Status: Addendum          Editor: Domingo Dimes, MD (Physician)          Related Notes: Original Note by Domingo Dimes, MD (Physician) filed at 09/10/21 1218               EMERGENCY DEPARTMENT HISTORY AND PHYSICAL EXAM           Date: 09/10/2021   Patient Name: Yolanda Weber        History of Presenting Illness          Chief Complaint       Patient presents with        ?  Back Pain           History Provided By: Patient      HPI: Yolanda Weber, 50 y.o. female past medical history significant for herniated lumbar disc, fibromyalgia, sciatica, sickle cell anemia patient  presents with complaint of lower back pain since yesterday, patient states yesterday she moved 4 boxes that weighed 35 to 40 pounds she walked with them and load them into the back of her truck at her worksite, pain intensity is 10/10 worsened with movement  denies any urinary or fecal incontinence      There are no other complaints, changes, or physical findings at this time.      PCP: Other, Phys, MD        No current facility-administered medications on file prior to encounter.          Current Outpatient Medications on File Prior to Encounter          Medication  Sig  Dispense  Refill           ?  lidocaine (LIDODERM) 5 %  Apply patch to the affected area for 12 hours a day and remove for 12 hours a day.  1 Each  0     ?  acetaminophen (TYLENOL) 325 mg tablet  Take 2 Tablets by mouth every four (4) hours as needed for Pain.  20 Tablet  0           ?  ibuprofen (MOTRIN) 400 mg tablet  Take 1 Tablet by mouth every six (6) hours as needed for Pain.  20 Tablet  0           ?  naloxone (NARCAN) 4 mg/actuation nasal spray  Use 1 spray intranasally, then discard. Repeat with new spray every 2 min as needed  for opioid overdose symptoms, alternating nostrils.  2 Each  0     ?  hydroxyurea (HYDREA) 500 mg capsule  Take 500 mg by mouth daily.         ?  clonazePAM (KlonoPIN) 1 mg tablet  Take 1 mg by mouth two (2) times a day.         ?  aripiprazole (ABILIFY PO)  Take 1 Tab by mouth daily.               ?  escitalopram oxalate (LEXAPRO) 5 mg tablet  Take 5 mg by mouth daily.                 Past  History        Past Medical History:     Past Medical History:        Diagnosis  Date         ?  Bulging lumbar disc       ?  Fibromyalgia       ?  Sciatica           ?  Sickle cell anemia (HCC)             Past Surgical History:     Past Surgical History:         Procedure  Laterality  Date          ?  HX CHOLECYSTECTOMY         ?  HX CYST REMOVAL  Right            Breast          ?  HX DILATION AND CURETTAGE               Family History:   History reviewed. No pertinent family history.      Social History:     Social History          Tobacco Use         ?  Smoking status:  Some Days         ?  Smokeless tobacco:  Never        ?  Tobacco comments:             1 pack every 3 days       Substance Use Topics         ?  Alcohol use:  Not Currently         ?  Drug use:  Never           Allergies:     Allergies        Allergen  Reactions         ?  Ibuprofen  Nausea and Vomiting         ?  Shellfish Derived  Hives             Review of Systems     Review of Systems    Constitutional:  Negative for chills and fever.    HENT:  Negative for rhinorrhea and sore throat.     Eyes:  Negative for pain and visual disturbance.    Respiratory:  Negative for cough and shortness of breath.     Cardiovascular:  Negative for chest pain and leg swelling.    Gastrointestinal:  Negative for abdominal pain and vomiting.    Endocrine: Negative for polydipsia and polyuria.    Genitourinary:  Negative for dysuria and hematuria.    Musculoskeletal:  Positive for back pain and myalgias . Negative for neck pain.    Skin:  Negative for color change and pallor.     Neurological:  Negative for weakness and headaches.    Psychiatric/Behavioral:  Negative for agitation and suicidal ideas.          Physical Exam     Physical Exam   Vitals and nursing note reviewed.    Constitutional:        General: She is not in acute distress.      Appearance: She is not ill-appearing, toxic-appearing or diaphoretic.    HENT:       Head: Normocephalic and atraumatic.       Right Ear: Tympanic  membrane normal.       Left Ear: Tympanic membrane normal.       Nose: Nose normal. No congestion.       Mouth/Throat:       Mouth: Mucous membranes are moist.       Pharynx: Oropharynx is clear.    Eyes:       Extraocular Movements: Extraocular movements intact.       Conjunctiva/sclera: Conjunctivae normal.       Pupils: Pupils are equal, round, and reactive to light.     Cardiovascular:       Rate and Rhythm: Normal rate and regular rhythm.       Pulses: Normal pulses.       Heart sounds: Normal heart sounds.    Pulmonary:       Effort: Pulmonary effort is normal.       Breath sounds: Normal breath sounds.     Abdominal:       General: Bowel sounds are normal.       Palpations: Abdomen is soft.       Tenderness: There is no abdominal tenderness.     Musculoskeletal:          General: Tenderness present. No deformity or signs of injury.       Cervical back: Normal, normal range of motion and neck supple. No rigidity or tenderness.       Lumbar back: Tenderness and bony tenderness  present. Decreased range of motion. Negative right straight leg raise test.         Back:         Lymphadenopathy:       Cervical: No cervical adenopathy.    Skin:      General: Skin is warm and dry.       Capillary Refill: Capillary refill takes less than 2 seconds.       Findings: No rash.    Neurological:       General: No focal deficit present.       Mental Status: She is alert and oriented to person, place, and time.       Cranial Nerves: No cranial nerve deficit.       Sensory: No sensory deficit.    Psychiatric:           Mood and Affect: Mood normal.          Behavior: Behavior normal.            Lab and Diagnostic Study Results     Labs -    No results found for this or any previous visit (from the past 12 hour(s)).      Radiologic Studies -    @lastxrresult @     CT Results  (Last 48 hours)             None                    CXR Results  (Last 48 hours)             None                       Medical Decision Making and ED Course     Differential Diagnosis & Medical Decision Making Provider Note:    Back pain DDx lumbar strain, closed fracture, joint dislocation      - I am the first provider for this patient.  I reviewed the vital signs, available nursing  notes, past medical history, past surgical history, family history and social history. The patients presenting problems have been discussed, and they are in agreement  with the care plan formulated and outlined with them.  I have encouraged them to ask questions as they arise throughout their visit.      Vital Signs-Reviewed the patient's vital signs.   Patient Vitals for the past 12 hrs:            Temp  Pulse  Resp  BP  SpO2            09/10/21 1106  97.7 ??F (36.5 ??C)  74  17  132/86  99 %           ED Course:      ED Course as of 09/10/21 1309       Sat Sep 10, 2021        1306  Patient's results were discussed with her patient requested narcotic medication when patient's chart was reviewed over the past 2 months every 2 weeks  patient has been presenting with pain complaints when receiving narcotic prescriptions, on further discussion the patient patient recently wrote relocated from [place] was under pain management treatment but since her relocation to our area this  summer she has not been able to get up with pain management consultation her PCP is Dr. Rolly Salter and she has not seen him and discussed this issue with him, patient was informed to continue with her present regimen of topical NSAID, Tylenol, Robaxin and  for her to use warm compresses to assist in her  relief of her pain, patient states she does not need a work note since she is off for the next 3 days, patient agreed with reassessment of treatment  [SB]              ED Course User Index   [SB] Domingo Dimes, MD                Procedures     Performed by: Domingo Dimes, MD   Procedures          Disposition     Disposition: Condition stable and improved   DC- Adult Discharges: All of the diagnostic tests were reviewed and questions answered. Diagnosis, care plan and treatment options were discussed.  The patient understands the instructions and will follow up as directed. The patients results have been  reviewed with them.  They have been counseled regarding their diagnosis.  The patient verbally convey understanding and agreement of the signs, symptoms, diagnosis, treatment and prognosis and additionally agrees to follow up as recommended with their  PCP in 24 - 48 hours.  They also agree with the care-plan and convey that all of their questions have been answered.  I have also put together some discharge instructions for them that include: 1) educational information regarding their diagnosis, 2)  how to care for their diagnosis at home, as well a 3) list of reasons why they would want to return to the ED prior to their follow-up appointment, should their condition change.      DISCHARGE PLAN:   1.      Current Discharge Medication List                 CONTINUE these medications which have NOT CHANGED          Details        lidocaine (LIDODERM) 5 %  Apply patch to the affected area  for 12 hours a day and remove for 12 hours a day.   Qty: 1 Each, Refills: 0               acetaminophen (TYLENOL) 325 mg tablet  Take 2 Tablets by mouth every four (4) hours as needed for Pain.   Qty: 20 Tablet, Refills: 0               ibuprofen (MOTRIN) 400 mg tablet  Take 1 Tablet by mouth every six (6) hours as needed for Pain.   Qty: 20 Tablet, Refills: 0               naloxone (NARCAN) 4 mg/actuation nasal spray   Use 1 spray intranasally, then discard. Repeat with new spray every 2 min as needed for opioid overdose symptoms, alternating nostrils.   Qty: 2 Each, Refills: 0               hydroxyurea (HYDREA) 500 mg capsule  Take 500 mg by mouth daily.               clonazePAM (KlonoPIN) 1 mg tablet  Take 1 mg by mouth two (2) times a day.               aripiprazole (ABILIFY PO)  Take 1 Tab by mouth daily.               escitalopram oxalate (LEXAPRO) 5 mg tablet  Take 5 mg by mouth daily.                      2.      Follow-up Information      None             3.  Return to ED if worse    4.      Current Discharge Medication List                Remove if admitted/transferred        Diagnosis/Clinical Impression        Clinical Impression: No diagnosis found.      Attestations: I,  Domingo Dimes, MD, am the primary clinician of record.      Please note that this dictation was completed with Dragon, the computer voice recognition software.  Quite often unanticipated grammatical, syntax, homophones, and other interpretive errors are inadvertently  transcribed by the computer software.  Please disregard these errors.  Please excuse any errors that have escaped final proofreading.  Thank you.

## 2024-05-09 ENCOUNTER — Inpatient Hospital Stay
Admission: EM | Admit: 2024-05-09 | Discharge: 2024-05-12 | Disposition: A | Discharge status: Home / Self Care | Payer: Medicaid (Managed Care) | Admitting: Hospitalist

## 2024-05-09 ENCOUNTER — Emergency Department: Admit: 2024-05-09 | Payer: Medicaid (Managed Care) | Primary: Internal Medicine

## 2024-05-09 DIAGNOSIS — K2970 Gastritis, unspecified, without bleeding: Principal | ICD-10-CM

## 2024-05-09 DIAGNOSIS — K5732 Diverticulitis of large intestine without perforation or abscess without bleeding: Secondary | ICD-10-CM

## 2024-05-09 LAB — CBC WITH AUTO DIFFERENTIAL
Band Neutrophils: 1 % (ref 0–6)
Basophils %: 0 % (ref 0–1)
Basophils Absolute: 0 10*3/uL (ref 0.0–0.1)
Eosinophils %: 0 % (ref 0–7)
Eosinophils Absolute: 0 10*3/uL (ref 0.0–0.4)
Hematocrit: 37 % (ref 35.0–47.0)
Hemoglobin: 13 g/dL (ref 11.5–16.0)
Immature Granulocytes %: 1 %
Immature Granulocytes Absolute: 0.15 10*3/uL
Lymphocytes %: 37 % (ref 12–49)
Lymphocytes Absolute: 5.55 10*3/uL — ABNORMAL HIGH (ref 0.8–3.5)
MCH: 28.9 pg (ref 26.0–34.0)
MCHC: 35.1 g/dL (ref 30.0–36.5)
MCV: 82.2 FL (ref 80.0–99.0)
MPV: 9.4 FL (ref 8.9–12.9)
Monocytes %: 8 % (ref 5–13)
Monocytes Absolute: 1.2 10*3/uL — ABNORMAL HIGH (ref 0.0–1.0)
Neutrophils %: 53 % (ref 32–75)
Neutrophils Absolute: 8.1 10*3/uL — ABNORMAL HIGH (ref 1.8–8.0)
Nucleated RBCs: 0 /100{WBCs}
Platelets: 451 10*3/uL — ABNORMAL HIGH (ref 150–400)
RBC: 4.5 M/uL (ref 3.80–5.20)
RDW: 14.6 % — ABNORMAL HIGH (ref 11.5–14.5)
WBC: 15 10*3/uL — ABNORMAL HIGH (ref 3.6–11.0)
nRBC: 0 10*3/uL (ref 0.00–0.01)

## 2024-05-09 LAB — COMPREHENSIVE METABOLIC PANEL
ALT: 47 U/L (ref 12–78)
AST: 26 U/L (ref 15–37)
Albumin/Globulin Ratio: 0.9 — ABNORMAL LOW (ref 1.1–2.2)
Albumin: 3.5 g/dL (ref 3.5–5.0)
Alk Phosphatase: 103 U/L (ref 45–117)
Anion Gap: 10 mmol/L (ref 2–12)
BUN/Creatinine Ratio: 16 (ref 12–20)
BUN: 15 mg/dL (ref 6–20)
CO2: 29 mmol/L (ref 21–32)
Calcium: 9.6 mg/dL (ref 8.5–10.1)
Chloride: 105 mmol/L (ref 97–108)
Creatinine: 0.92 mg/dL (ref 0.55–1.02)
Est, Glom Filt Rate: 74 mL/min/{1.73_m2} (ref >60)
Globulin: 4 g/dL (ref 2.0–4.0)
Glucose: 113 mg/dL — ABNORMAL HIGH (ref 65–100)
Potassium: 3.1 mmol/L — ABNORMAL LOW (ref 3.5–5.1)
Sodium: 144 mmol/L (ref 136–145)
Total Bilirubin: 0.5 mg/dL (ref 0.2–1.0)
Total Protein: 7.5 g/dL (ref 6.4–8.2)

## 2024-05-09 LAB — TROPONIN: Troponin, High Sensitivity: 4 ng/L (ref 0–51)

## 2024-05-09 LAB — MAGNESIUM: Magnesium: 1.9 mg/dL (ref 1.6–2.4)

## 2024-05-09 LAB — LIPASE: Lipase: 47 U/L (ref 13–75)

## 2024-05-09 MED ORDER — NORMAL SALINE FLUSH 0.9 % IV SOLN
0.9 | Freq: Two times a day (BID) | INTRAVENOUS | Status: DC
Start: 2024-05-09 — End: 2024-05-12
  Administered 2024-05-10 – 2024-05-12 (×6): 10 mL via INTRAVENOUS

## 2024-05-09 MED ORDER — NICOTINE 14 MG/24HR TD PT24
14 | Freq: Every day | TRANSDERMAL | Status: DC
Start: 2024-05-09 — End: 2024-05-12
  Administered 2024-05-09 – 2024-05-12 (×4): 1 via TRANSDERMAL

## 2024-05-09 MED ORDER — POTASSIUM BICARB-CITRIC ACID 20 MEQ PO TBEF
20 | ORAL | Status: DC | PRN
Start: 2024-05-09 — End: 2024-05-12

## 2024-05-09 MED ORDER — LEVOFLOXACIN IN D5W 750 MG/150ML IV SOLN
750 | INTRAVENOUS | Status: AC
Start: 2024-05-09 — End: 2024-05-09
  Administered 2024-05-09: 16:00:00 750 mg via INTRAVENOUS

## 2024-05-09 MED ORDER — POLYETHYLENE GLYCOL 3350 17 G PO PACK
17 | Freq: Every day | ORAL | Status: DC | PRN
Start: 2024-05-09 — End: 2024-05-12

## 2024-05-09 MED ORDER — POTASSIUM CHLORIDE CRYS ER 20 MEQ PO TBCR
20 | Freq: Once | ORAL | Status: AC
Start: 2024-05-09 — End: 2024-05-09
  Administered 2024-05-09: 15:00:00 40 meq via ORAL

## 2024-05-09 MED ORDER — ONDANSETRON 4 MG PO TBDP
4 | Freq: Three times a day (TID) | ORAL | Status: DC | PRN
Start: 2024-05-09 — End: 2024-05-12

## 2024-05-09 MED ORDER — PIPERACILLIN SOD-TAZOBACTAM SO 4.5 (4-0.5) G IV SOLR
4.5 | Freq: Four times a day (QID) | INTRAVENOUS | Status: DC
Start: 2024-05-09 — End: 2024-05-09

## 2024-05-09 MED ORDER — NORMAL SALINE FLUSH 0.9 % IV SOLN
0.9 | INTRAVENOUS | Status: DC | PRN
Start: 2024-05-09 — End: 2024-05-12
  Administered 2024-05-09 – 2024-05-10 (×3): 10 mL via INTRAVENOUS

## 2024-05-09 MED ORDER — ACETAMINOPHEN 650 MG RE SUPP
650 | Freq: Four times a day (QID) | RECTAL | Status: DC | PRN
Start: 2024-05-09 — End: 2024-05-12

## 2024-05-09 MED ORDER — POTASSIUM CHLORIDE CRYS ER 20 MEQ PO TBCR
20 | ORAL | Status: DC | PRN
Start: 2024-05-09 — End: 2024-05-12

## 2024-05-09 MED ORDER — MAGNESIUM SULFATE 2000 MG/50 ML IVPB PREMIX
2 | INTRAVENOUS | Status: DC | PRN
Start: 2024-05-09 — End: 2024-05-12

## 2024-05-09 MED ORDER — POTASSIUM CHLORIDE 10 MEQ/100ML IV SOLN
10 | INTRAVENOUS | Status: DC | PRN
Start: 2024-05-09 — End: 2024-05-12

## 2024-05-09 MED ORDER — PANTOPRAZOLE SODIUM 40 MG IV SOLR
40 | Freq: Two times a day (BID) | INTRAVENOUS | Status: AC
Start: 2024-05-09 — End: 2024-05-10
  Administered 2024-05-10 – 2024-05-11 (×3): 40 mg via INTRAVENOUS

## 2024-05-09 MED ORDER — ENOXAPARIN SODIUM 40 MG/0.4ML IJ SOSY
40 | Freq: Every day | INTRAMUSCULAR | Status: DC
Start: 2024-05-09 — End: 2024-05-12
  Administered 2024-05-10 – 2024-05-12 (×3): 40 mg via SUBCUTANEOUS

## 2024-05-09 MED ORDER — MELATONIN 5 MG PO TBDP
5 | Freq: Every evening | ORAL | Status: DC
Start: 2024-05-09 — End: 2024-05-10
  Administered 2024-05-10: 01:00:00 5 mg via ORAL

## 2024-05-09 MED ORDER — KETOROLAC TROMETHAMINE 15 MG/ML IJ SOLN
15 | Freq: Once | INTRAMUSCULAR | Status: AC
Start: 2024-05-09 — End: 2024-05-09
  Administered 2024-05-09: 13:00:00 15 mg via INTRAVENOUS

## 2024-05-09 MED ORDER — POTASSIUM CHLORIDE IN NACL 20-0.45 MEQ/L-% IV SOLN
20-0.45 | INTRAVENOUS | Status: DC
Start: 2024-05-09 — End: 2024-05-10
  Administered 2024-05-09 – 2024-05-10 (×2): via INTRAVENOUS

## 2024-05-09 MED ORDER — ALUM & MAG HYDROXIDE-SIMETH 200-200-20 MG/5ML PO SUSP
200-200-20 | Freq: Three times a day (TID) | ORAL | Status: AC
Start: 2024-05-09 — End: 2024-05-10
  Administered 2024-05-09 – 2024-05-10 (×3): 30 mL via ORAL

## 2024-05-09 MED ORDER — SODIUM CHLORIDE 0.9 % IV SOLN
0.9 | INTRAVENOUS | Status: DC | PRN
Start: 2024-05-09 — End: 2024-05-12

## 2024-05-09 MED ORDER — PIPERACILLIN SOD-TAZOBACTAM SO 4.5 (4-0.5) G IV SOLR
4.5 | Freq: Once | INTRAVENOUS | Status: AC
Start: 2024-05-09 — End: 2024-05-09
  Administered 2024-05-09: 19:00:00 4500 mg via INTRAVENOUS

## 2024-05-09 MED ORDER — CLONAZEPAM 0.5 MG PO TABS
0.5 | Freq: Two times a day (BID) | ORAL | Status: DC | PRN
Start: 2024-05-09 — End: 2024-05-10

## 2024-05-09 MED ORDER — PANTOPRAZOLE SODIUM 40 MG IV SOLR
40 | Freq: Every day | INTRAVENOUS | Status: DC
Start: 2024-05-09 — End: 2024-05-09
  Administered 2024-05-09: 13:00:00 40 mg via INTRAVENOUS

## 2024-05-09 MED ORDER — HYDROMORPHONE HCL 1 MG/ML IJ SOLN
1 | INTRAMUSCULAR | Status: DC | PRN
Start: 2024-05-09 — End: 2024-05-11
  Administered 2024-05-09 – 2024-05-11 (×9): 0.5 mg via INTRAVENOUS

## 2024-05-09 MED ORDER — ACETAMINOPHEN 325 MG PO TABS
325 | Freq: Four times a day (QID) | ORAL | Status: DC | PRN
Start: 2024-05-09 — End: 2024-05-12

## 2024-05-09 MED ORDER — CLONAZEPAM 0.5 MG PO TABS
0.5 | Freq: Two times a day (BID) | ORAL | Status: DC
Start: 2024-05-09 — End: 2024-05-09

## 2024-05-09 MED ORDER — HYDROMORPHONE HCL 1 MG/ML IJ SOLN
1 | INTRAMUSCULAR | Status: AC
Start: 2024-05-09 — End: 2024-05-09
  Administered 2024-05-09: 15:00:00 0.5 mg via INTRAVENOUS

## 2024-05-09 MED ORDER — KETOROLAC TROMETHAMINE 30 MG/ML IJ SOLN
30 | Freq: Four times a day (QID) | INTRAMUSCULAR | Status: DC
Start: 2024-05-09 — End: 2024-05-10
  Administered 2024-05-09 – 2024-05-10 (×3): 30 mg via INTRAVENOUS

## 2024-05-09 MED ORDER — METRONIDAZOLE 500 MG/100ML IV SOLN
500 | INTRAVENOUS | Status: AC
Start: 2024-05-09 — End: 2024-05-09
  Administered 2024-05-09: 15:00:00 500 mg via INTRAVENOUS

## 2024-05-09 MED ORDER — PIPERACILLIN SOD-TAZOBACTAM SO 3.375 (3-0.375) G IV SOLR
3.375 | Freq: Three times a day (TID) | INTRAVENOUS | Status: DC
Start: 2024-05-09 — End: 2024-05-12
  Administered 2024-05-10 – 2024-05-12 (×8): 3375 mg via INTRAVENOUS

## 2024-05-09 MED ORDER — ONDANSETRON HCL 4 MG/2ML IJ SOLN
4 | Freq: Once | INTRAMUSCULAR | Status: AC
Start: 2024-05-09 — End: 2024-05-09
  Administered 2024-05-09: 13:00:00 4 mg via INTRAVENOUS

## 2024-05-09 MED ORDER — SODIUM CHLORIDE 0.9 % IV BOLUS
0.9 | Freq: Once | INTRAVENOUS | Status: AC
Start: 2024-05-09 — End: 2024-05-09
  Administered 2024-05-09: 15:00:00 1000 mL via INTRAVENOUS

## 2024-05-09 MED ORDER — ONDANSETRON HCL 4 MG/2ML IJ SOLN
4 | Freq: Four times a day (QID) | INTRAMUSCULAR | Status: DC | PRN
Start: 2024-05-09 — End: 2024-05-12

## 2024-05-09 MED FILL — LEVOFLOXACIN IN D5W 750 MG/150ML IV SOLN: 750 MG/150ML | INTRAVENOUS | Qty: 150 | Fill #0

## 2024-05-09 MED FILL — HYDROMORPHONE HCL 1 MG/ML IJ SOLN: 1 MG/ML | INTRAMUSCULAR | Qty: 0.5 | Fill #0

## 2024-05-09 MED FILL — KETOROLAC TROMETHAMINE 30 MG/ML IJ SOLN: 30 MG/ML | INTRAMUSCULAR | Qty: 1 | Fill #0

## 2024-05-09 MED FILL — METRONIDAZOLE 500 MG/100ML IV SOLN: 500 MG/100ML | INTRAVENOUS | Qty: 100 | Fill #0

## 2024-05-09 MED FILL — POTASSIUM CHLORIDE IN NACL 20-0.45 MEQ/L-% IV SOLN: 20-0.45 MEQ/L-% | INTRAVENOUS | Qty: 1000 | Fill #0

## 2024-05-09 MED FILL — KETOROLAC TROMETHAMINE 15 MG/ML IJ SOLN: 15 MG/ML | INTRAMUSCULAR | Qty: 1 | Fill #0

## 2024-05-09 MED FILL — SODIUM CHLORIDE 0.9 % IV SOLN: 0.9 % | INTRAVENOUS | Qty: 1000 | Fill #0

## 2024-05-09 MED FILL — PANTOPRAZOLE SODIUM 40 MG IV SOLR: 40 MG | INTRAVENOUS | Qty: 40 | Fill #0

## 2024-05-09 MED FILL — ONDANSETRON HCL 4 MG/2ML IJ SOLN: 4 MG/2ML | INTRAMUSCULAR | Qty: 2 | Fill #0

## 2024-05-09 MED FILL — MAG-AL PLUS 200-200-20 MG/5ML PO LIQD: 200-200-20 MG/5ML | ORAL | Qty: 30 | Fill #0

## 2024-05-09 MED FILL — NICOTINE STEP 2 14 MG/24HR TD PT24: 14 MG/24HR | TRANSDERMAL | Qty: 1 | Fill #0

## 2024-05-09 MED FILL — DILAUDID 1 MG/ML IJ SOLN: 1 MG/ML | INTRAMUSCULAR | Qty: 0.5 | Fill #0

## 2024-05-09 MED FILL — NORMAL SALINE FLUSH 0.9 % IV SOLN: 0.9 % | INTRAVENOUS | Qty: 40 | Fill #0

## 2024-05-09 MED FILL — PIPERACILLIN SOD-TAZOBACTAM SO 4.5 (4-0.5) G IV SOLR: 4.5 (4-0.5) g | INTRAVENOUS | Qty: 4500 | Fill #0

## 2024-05-09 MED FILL — POTASSIUM CHLORIDE CRYS ER 20 MEQ PO TBCR: 20 MEQ | ORAL | Qty: 2 | Fill #0

## 2024-05-09 NOTE — Other (Signed)
 Shift assessment reviewed.

## 2024-05-09 NOTE — ED Notes (Signed)
 Rounded on patient; patient has no needs at this time. Call bell within reach.

## 2024-05-09 NOTE — H&P (Signed)
 History and Physical  Dr Davie Essex MD      Chief Complaints:     Chief Complaint   Patient presents with    Chest Pain         Subjective:     Yolanda Weber is a 53 y.o. female followed by No primary care provider on file. and  has a past medical history of Angiomyolipoma of both kidneys, Bulging lumbar disc, Diverticulosis, Fibromyalgia, Sciatica, and Sickle cell trait.    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  Last night prior to starting work she was having left arm and chest pain and stayed for short period time but she was still able to go to work.  It is noted that of 1 month back she had nuclear stress test performed that was found to be negative.  For the past 3 days she has been having multiple episodes of diarrhea especially when she eats any food but abdominal pain started this morning mainly in the epigastric and left upper quadrant and she has history of heartburn and recently appears to have been started on a PPI.  On evaluation blood pressure 144/91 heart rate of 95 O2 sat of 100% on room air she had a mild leukocytosis of 15 and BMP only abnormality noted was a potassium of 3.1.  Initial troponin was 4  CT of the abdomen pelvis showed moderate colonic diverticulosis with an area of subtotal pericolonic fat stranding around the splenic flexure with suspicion for mild or early diverticulitis.  She also has numerous chronic angiomyolipomas of bilateral kidney and had followed with urology in the past but not recently.  She initially was given a 1 L normal saline bolus 40 mEq oral potassium and IV Protonix and Zofran 4 mg.  She was given Flagyl dose as well as Levaquin and Toradol 15 mg IV x 1 she continued to have pain so Dilaudid 0.5 mg IV x 1 was given and was then referred for admission for acute diverticulitis      Discussed management with the ED provider Dr. Austin Leff, MD and agree with the plan for hospitalization       Past Medical History:    Diagnosis Date    Angiomyolipoma of both kidneys     Bulging lumbar disc     Diverticulosis     Fibromyalgia     Sciatica     Sickle cell trait       Past Surgical History:   Procedure Laterality Date    CHOLECYSTECTOMY      CYST REMOVAL Right     Breast    DILATION AND CURETTAGE OF UTERUS       History reviewed. No pertinent family history.   Social History     Tobacco Use    Smoking status: Some Days     Passive exposure: Never    Smokeless tobacco: Never   Substance Use Topics    Alcohol use: Not Currently       Prior to Admission medications    Medication Sig Start Date End Date Taking? Authorizing Provider   meloxicam (MOBIC) 15 MG tablet Take 1 tablet by mouth daily 04/29/24  Yes [provider]   acetaminophen (TYLENOL) 325 MG tablet Take 2 tablets by mouth every 4 hours as needed 08/14/21  Yes Automatic Reconciliation, Ar   pantoprazole (PROTONIX) 40 MG tablet Take 1 tablet by mouth daily 03/10/24 05/09/24  [provider]  clonazePAM  (KLONOPIN ) 1 MG tablet Take 1 tablet by mouth 2 times daily.    Automatic Reconciliation, Ar     Allergies   Allergen Reactions    Shellfish Allergy Hives    Ibuprofen Nausea And Vomiting        Review of Systems:  Review of Systems   Constitutional: Negative.    Eyes: Negative.    Respiratory: Negative.     Cardiovascular:  Positive for chest pain.   Gastrointestinal:  Positive for abdominal pain and diarrhea.   Endocrine: Negative.    Genitourinary: Negative.    Musculoskeletal: Negative.    Allergic/Immunologic: Negative.    Neurological: Negative.    Hematological: Negative.    Psychiatric/Behavioral: Negative.            Objective:     Vitals:  BP (!) 140/100   Pulse 82   Temp 97.7 F (36.5 C) (Oral)   Resp 16   Ht 1.575 m (5\' 2" )   Wt 87.9 kg (193 lb 11.2 oz)   SpO2 98%   BMI 35.43 kg/m     Physical Exam:  General: Alert, cooperative, no distress.  Head:  Normocephalic, without obvious abnormality, atraumatic.  Eyes:  Conjunctivae/corneas clear.  Pupils equal, round, reactive to light. Extraocular movements intact.  Lungs:  Clear to auscultation bilaterally, no wheezes, crackles  Chest wall: No tenderness or deformity.  Heart:  Regular rate and rhythm, S1, S2 normal, no murmur, click, rub, or gallop.  Abdomen:   Soft, epigastric and left upper quadrant tenderness no guarding or rebound bowel sounds normal. No masses. No organomegaly.  Back:  No spine tenderness to palpation  Extremities: Extremities normal, atraumatic, no cyanosis or edema.  Pulses: Symmetric all extremities.  Skin: Skin color, texture, turgor normal.   Lymph nodes: Cervical nodes normal.  Neurologic: CNII-XII intact. Normal strength, sensation, and reflexes throughout.      Labs:  Recent Results (from the past 24 hours)   EKG 12 Lead    Collection Time: 05/09/24  8:54 AM   Result Value Ref Range    Ventricular Rate 93 BPM    Atrial Rate 93 BPM    P-R Interval 123 ms    QRS Duration 90 ms    Q-T Interval 388 ms    QTc Calculation (Bazett) 483 ms    P Axis 71 degrees    R Axis 66 degrees    T Axis 56 degrees    Diagnosis Sinus rhythm  Biatrial enlargement      CBC with Auto Differential    Collection Time: 05/09/24  9:08 AM   Result Value Ref Range    WBC 15.0 (H) 3.6 - 11.0 K/uL    RBC 4.50 3.80 - 5.20 M/uL    Hemoglobin 13.0 11.5 - 16.0 g/dL    Hematocrit 65.7 84.6 - 47.0 %    MCV 82.2 80.0 - 99.0 FL    MCH 28.9 26.0 - 34.0 PG    MCHC 35.1 30.0 - 36.5 g/dL    RDW 96.2 (H) 95.2 - 14.5 %    Platelets 451 (H) 150 - 400 K/uL    MPV 9.4 8.9 - 12.9 FL    Nucleated RBCs 0.0 0.0 PER 100 WBC    nRBC 0.00 0.00 - 0.01 K/uL    Neutrophils % 53.0 32 - 75 %    Band Neutrophils 1 0 - 6 %    Lymphocytes % 37.0 12 - 49 %    Monocytes % 8.0  5 - 13 %    Eosinophils % 0.0 0 - 7 %    Basophils % 0.0 0 - 1 %    Immature Granulocytes % 1.0 %    Neutrophils Absolute 8.10 (H) 1.8 - 8.0 K/UL    Lymphocytes Absolute 5.55 (H) 0.8 - 3.5 K/UL    Monocytes Absolute 1.20 (H) 0.0 - 1.0 K/UL    Eosinophils Absolute 0.00 0.0  - 0.4 K/UL    Basophils Absolute 0.00 0.0 - 0.1 K/UL    Immature Granulocytes Absolute 0.15 K/UL    Differential Type Smear Scanned      RBC Comment Normocytic, Normochromic     Comprehensive Metabolic Panel    Collection Time: 05/09/24  9:08 AM   Result Value Ref Range    Sodium 144 136 - 145 mmol/L    Potassium 3.1 (L) 3.5 - 5.1 mmol/L    Chloride 105 97 - 108 mmol/L    CO2 29 21 - 32 mmol/L    Anion Gap 10 2 - 12 mmol/L    Glucose 113 (H) 65 - 100 mg/dL    BUN 15 6 - 20 mg/dL    Creatinine 2.95 6.21 - 1.02 mg/dL    BUN/Creatinine Ratio 16 12 - 20      Est, Glom Filt Rate 74 >60 ml/min/1.75m2    Calcium 9.6 8.5 - 10.1 mg/dL    Total Bilirubin 0.5 0.2 - 1.0 mg/dL    AST 26 15 - 37 U/L    ALT 47 12 - 78 U/L    Alk Phosphatase 103 45 - 117 U/L    Total Protein 7.5 6.4 - 8.2 g/dL    Albumin 3.5 3.5 - 5.0 g/dL    Globulin 4.0 2.0 - 4.0 g/dL    Albumin/Globulin Ratio 0.9 (L) 1.1 - 2.2     Troponin    Collection Time: 05/09/24  9:08 AM   Result Value Ref Range    Troponin, High Sensitivity 4 0 - 51 ng/L   Lipase    Collection Time: 05/09/24  9:08 AM   Result Value Ref Range    Lipase 47 13 - 75 U/L   Magnesium    Collection Time: 05/09/24  9:08 AM   Result Value Ref Range    Magnesium 1.9 1.6 - 2.4 mg/dL       Imaging:  CT ABDOMEN PELVIS WO CONTRAST Additional Contrast? None  Result Date: 05/09/2024  1. Moderate colonic diverticulosis, with an area of subtle pericolonic fat stranding around the splenic flecture, suspicious for very mild or early diverticulitis. 2. Numerous bilateral renal masses, most of which are confidently identified as angiomyolipomas. However, there are some small lesions which are not fully characterized. Recommend renal protocol abdominal MRI for more complete evaluation, or prior studies if available would be helpful to establish stability. Presence of multiple angiomyolipomas raises the suspicion for underlying systemic conditions including tuberous sclerosis, neurofibromatosis, and von  Hippel-Lindau syndrome. 3. 3.9 cm exophytic uterine fibroid. Electronically signed by Valorie Gearing       Assessment & Plan:     Hospital Problems           Last Modified POA    * (Principal) Diverticulitis 05/09/2024 Yes      Acute diverticulitis  -As noted on CT showing moderate diverticulosis with area of stranding  -Noted leukocytosis of 15  -Flagyl and Levaquin given in the emergency room will continue Zosyn 4.5 g every 6 hours  -Pain medications with IV Dilaudid  every 4 hours as needed  -Toradol 30 mg IV every 6 hours x 4 doses  -Clear liquid diet  -Stool studies for enteric bacteria  -IV fluids  - IV Protonix 40 twice daily and Mylanta prior to meals for possible gastritis//GERD symptoms    Hypokalemia  - Mildly low at 3.1 and 40 mEq KCl x 1 given the emergency room  - Will supplement with 20 mEq KCl and IV fluids  - Follow repeat labs in am    Tobacco use  - Smokes about 5 to 6 cigarettes/day and wishes for nicotine patch  - Start 14 mg patch and change daily    Fibromyalgia  - States she only uses Tylenol but placed on IV Toradol and as needed Dilaudid for diverticulitis pain  - Monitor closely  - Restarted home Klonopin 1 mg changed to every 12 hours as needed    DVT prophylaxis  - Lovenox    CODE STATUS: Full code  Designates her father as a medical contact    Discussion/MDM: Patient with multiple medical comorbidities, each with high likelihood for morbidity and mortality if left untreated.   I have reviewed patient's presenting subjective and objective findings, as well as all laboratory studies, imaging studies, and vital signs to date as well as treatment rendered and patient's response to those treatments.  In addition, prior medical, surgical and relevant social and family histories were reviewed. I have discussed management plan with patient/family and with nursing staff.      Toxic drug monitoring:    This is dictation was done by dragon, Advertising account planner. Quite often  unanticipated grammatical, syntax, homophones and other interpretive errors or inadvertently transcribed by the computer software. Please excuse errors that have escaped final proofreading. Thank you.       Spent 75 minutes evaluating and coordinating patient's admission to remote telemetry and expecting at least 1 to 2 days of acute care stay    Electronically signed by Eve Rey R Christel Bai, MD on 05/09/2024 at 5:10 PM

## 2024-05-09 NOTE — ED Triage Notes (Signed)
 Patient presents for complaint of chest pain and shortness of breath since last night.  Patient also complaining of diarrhea for several days.

## 2024-05-09 NOTE — Progress Notes (Signed)
 Patient has not voided or passed a BM yet.

## 2024-05-09 NOTE — Progress Notes (Signed)
 Pt c/o abdominal pain - rates 8/10.  Administered Dilaudid 0.5 mg via  I.V.

## 2024-05-09 NOTE — Plan of Care (Signed)
 Problem: Pain  Goal: Verbalizes/displays adequate comfort level or baseline comfort level  Outcome: Progressing  Flowsheets (Taken 05/09/2024 1731)  Verbalizes/displays adequate comfort level or baseline comfort level:   Encourage patient to monitor pain and request assistance   Assess pain using appropriate pain scale   Administer analgesics based on type and severity of pain and evaluate response

## 2024-05-09 NOTE — ED Provider Notes (Signed)
 [facility]  EMERGENCY DEPARTMENT  EMERGENCY DEPARTMENT ENCOUNTER      Pt Name: Yolanda Weber  MRN: [id number]  Birthdate Jul 10, 1971  Date of evaluation: 05/09/2024  Provider: Austin Leff, MD    CHIEF COMPLAINT       Chief Complaint   Patient presents with    Chest Pain         HISTORY OF PRESENT ILLNESS   (Location/Symptom, Timing/Onset, Context/Setting, Quality, Duration, Modifying Factors, Severity)  Note limiting factors.   Sofya Moustafa is a 53 y.o. female who presents to the emergency department with several complaints.  Primary problem is upper abdominal pain also with left lateral chest pain and left arm pain.  Patient notes decreased appetite p.o. intake and diarrhea every time she eats ongoing now for 3 days.  No fever no GI blood loss no lower abdominal pain.  Admitted to [facility] 2 months ago for similar complaints workup there included a nucleotide stress test which was negative for ischemia.  Long history of fibromyalgia back pain status post cholecystectomy.    HPI    Nursing Notes were reviewed.    REVIEW OF SYSTEMS    (2-9 systems for level 4, 10 or more for level 5)     Review of Systems   Constitutional: Negative.    HENT: Negative.     Eyes: Negative.    Respiratory: Negative.     Cardiovascular:  Positive for chest pain. Negative for palpitations and leg swelling.   Gastrointestinal:  Positive for abdominal pain, diarrhea and nausea. Negative for anal bleeding, blood in stool, constipation, rectal pain and vomiting.   Endocrine: Negative.    Genitourinary: Negative.    Musculoskeletal:  Positive for arthralgias and back pain. Negative for gait problem, joint swelling, myalgias, neck pain and neck stiffness.   Allergic/Immunologic: Negative.    Neurological: Negative.    Hematological: Negative.    Psychiatric/Behavioral: Negative.         Except as noted above the remainder of the review of systems was reviewed and negative.       PAST MEDICAL HISTORY     Past Medical History:    Diagnosis Date    Bulging lumbar disc     Fibromyalgia     Sciatica     Sickle cell anemia (HCC)          SURGICAL HISTORY       Past Surgical History:   Procedure Laterality Date    CHOLECYSTECTOMY      CYST REMOVAL Right     Breast    DILATION AND CURETTAGE OF UTERUS           CURRENT MEDICATIONS       Previous Medications    ACETAMINOPHEN (TYLENOL) 325 MG TABLET    Take 2 tablets by mouth every 4 hours as needed    CLONAZEPAM (KLONOPIN) 1 MG TABLET    Take 1 tablet by mouth 2 times daily. Max Daily Amount: 2 mg    ESCITALOPRAM (LEXAPRO) 5 MG TABLET    Take 1 tablet by mouth daily    HYDROXYUREA (HYDREA) 500 MG CHEMO CAPSULE    Take 1 capsule by mouth daily    IBUPROFEN (ADVIL;MOTRIN) 400 MG TABLET    Take 1 tablet by mouth every 6 hours as needed    LIDOCAINE (LIDODERM) 5 %    Apply patch to the affected area for 12 hours a day and remove for 12 hours a day.    NALOXONE  4 MG/0.1ML LIQD NASAL SPRAY    Use 1 spray intranasally, then discard. Repeat with new spray every 2 min as needed for opioid overdose symptoms, alternating nostrils.       ALLERGIES     Shellfish allergy and Ibuprofen    FAMILY HISTORY     History reviewed. No pertinent family history.       SOCIAL HISTORY       Social History     Socioeconomic History    Marital status: Single     Spouse name: None    Number of children: None    Years of education: None    Highest education level: None   Tobacco Use    Smoking status: Some Days     Passive exposure: Never    Smokeless tobacco: Never   Vaping Use    Vaping status: Never Used   Substance and Sexual Activity    Alcohol use: Not Currently    Drug use: Never     Social Drivers of Health     Food Insecurity: No Food Insecurity (03/10/2024)    Received from [facility]    Hunger Vital Sign     Worried About Running Out of Food in the Last Year: Never true     Ran Out of Food in the Last Year: Never true   Transportation Needs: No Transportation Needs (03/10/2024)    Received from [facility]     Lack of Transportation (Medical): No     Lack of Transportation (Non-Medical): No    Received from [employer] Stability: Low Risk  (03/10/2024)    Received from [facility] Stability Vital Sign     Unable to Pay for Housing in the Last Year: No     Number of Times Moved in the Last Year: 0     Homeless in the Last Year: No       SCREENINGS         Glasgow Coma Scale  Eye Opening: Spontaneous  Best Verbal Response: Oriented  Best Motor Response: Obeys commands  Glasgow Coma Scale Score: 15                     CIWA Assessment  BP: (!) 144/91  Pulse: 95                 PHYSICAL EXAM    (up to 7 for level 4, 8 or more for level 5)     ED Triage Vitals [05/09/24 0901]   BP Systolic BP Percentile Diastolic BP Percentile Temp Temp Source Pulse Respirations SpO2   (!) 144/91 -- -- 97.5 F (36.4 C) Oral 95 18 100 %      Height Weight - Scale         1.575 m (5\' 2" ) 87.9 kg (193 lb 11.2 oz)           Middle-age AA female appears uncomfortable no acute distress  Physical Exam  Vitals and nursing note reviewed.   Constitutional:       General: She is not in acute distress.     Appearance: Normal appearance. She is not ill-appearing or toxic-appearing.   HENT:      Head: Normocephalic and atraumatic.      Nose: Nose normal.      Mouth/Throat:      Pharynx: No oropharyngeal exudate or posterior  oropharyngeal erythema.   Eyes:      Extraocular Movements: Extraocular movements intact.      Conjunctiva/sclera: Conjunctivae normal.   Cardiovascular:      Rate and Rhythm: Normal rate and regular rhythm.      Pulses: Normal pulses.      Heart sounds: Normal heart sounds. No murmur heard.  Pulmonary:      Effort: No respiratory distress.      Breath sounds: No wheezing, rhonchi or rales.   Abdominal:      General: Abdomen is flat. Bowel sounds are normal. There is no distension.      Tenderness: There is abdominal tenderness. There is no right CVA tenderness, left CVA  tenderness, guarding or rebound.      Hernia: No hernia is present.      Comments: Left upper quadrant abdominal tenderness   Musculoskeletal:         General: Tenderness present. No swelling, deformity or signs of injury. Normal range of motion.        Arms:       Cervical back: Normal range of motion and neck supple. No rigidity or tenderness.      Right lower leg: No edema.      Left lower leg: No edema.      Comments: Tender in the left lateral chest and left anterior upper arm palpation clearly worsens pain   also with left upper quadrant abdominal tenderness       Skin:     General: Skin is warm and dry.      Capillary Refill: Capillary refill takes less than 2 seconds.      Findings: No erythema or rash.   Neurological:      General: No focal deficit present.      Mental Status: She is alert and oriented to person, place, and time.      Cranial Nerves: No cranial nerve deficit.      Sensory: No sensory deficit.      Motor: No weakness.      Coordination: Coordination normal.      Gait: Gait normal.   Psychiatric:         Mood and Affect: Mood normal.         Behavior: Behavior normal.         Thought Content: Thought content normal.         DIAGNOSTIC RESULTS     EKG: All EKG's are interpreted by the Emergency Department Physician who either signs or Co-signs this chart in the absence of a cardiologist.  EKG shows a sinus rhythm at 93 changes of biatrial enlargement no STEMI normal intervals normal axis    RADIOLOGY:   Non-plain film images such as CT, Ultrasound and MRI are read by the radiologist. Plain radiographic images are visualized and preliminarily interpreted by the emergency physician with the below findings:        Interpretation per the Radiologist below, if available at the time of this note:    CT ABDOMEN PELVIS WO CONTRAST Additional Contrast? None    (Results Pending)         ED BEDSIDE ULTRASOUND:   Performed by ED Physician - none    LABS:  Labs Reviewed   CBC WITH AUTO DIFFERENTIAL    COMPREHENSIVE METABOLIC PANEL   TROPONIN   LIPASE       All other labs were within normal range or not returned as of this dictation.    EMERGENCY DEPARTMENT  COURSE and DIFFERENTIAL DIAGNOSIS/MDM:   Vitals:    Vitals:    05/09/24 0901   BP: (!) 144/91   Pulse: 95   Resp: 18   Temp: 97.5 F (36.4 C)   TempSrc: Oral   SpO2: 100%   Weight: 87.9 kg (193 lb 11.2 oz)   Height: 1.575 m (5\' 2" )           Medical Decision Making  Amount and/or Complexity of Data Reviewed  Labs: ordered.  Radiology: ordered.  ECG/medicine tests: ordered.    Risk  Prescription drug management.  Decision regarding hospitalization.    Upper abdominal pain and tenderness with leukocytosis CT showing diverticulitis will give initial dose of antibiotics fluids and pain medication potassium supplementation.  Chest pain clearly chest wall by exam EKG is normal and troponin is negative.  No improvement in pain with Toradol treated with standard antibiotics        REASSESSMENT          CRITICAL CARE TIME   Total Critical Care time was      minutes, excluding separately reportable procedures.  There was a high probability of clinically significant/life threatening deterioration in the patient's condition which required my urgent intervention.   CONSULTS:  Hospitalist text consult will admit    PROCEDURES:  Unless otherwise noted below, none     Procedures    FINAL IMPRESSION    No diagnosis found.      DISPOSITION/PLAN   DISPOSITION                 PATIENT REFERRED TO:  No follow-up provider specified.    DISCHARGE MEDICATIONS:  New Prescriptions    No medications on file     Controlled Substances Monitoring:          No data to display                (Please note that portions of this note were completed with a voice recognition program.  Efforts were made to edit the dictations but occasionally words are mis-transcribed.)    Austin Leff, MD (electronically signed)  Attending Emergency Physician           Benjamen Brand, MD  05/09/24 1309

## 2024-05-09 NOTE — ED Notes (Signed)
 Unable to obtain 2nd set of blood cultures, lab into try but also unable to obtain sample

## 2024-05-09 NOTE — Plan of Care (Signed)
 Problem: Pain  Goal: Verbalizes/displays adequate comfort level or baseline comfort level  05/09/2024 2200 by Audrea Learned, LPN  Outcome: Progressing  05/09/2024 1731 by Claudio Culver, RN  Outcome: Progressing  Flowsheets (Taken 05/09/2024 1731)  Verbalizes/displays adequate comfort level or baseline comfort level:   Encourage patient to monitor pain and request assistance   Assess pain using appropriate pain scale   Administer analgesics based on type and severity of pain and evaluate response

## 2024-05-09 NOTE — Progress Notes (Signed)
 Bedside shift change report given to [facility] LPN (oncoming nurse) by Mac Savers (offgoing nurse). Report included the following information Nurse Handoff Report.

## 2024-05-10 LAB — CBC WITH AUTO DIFFERENTIAL
Basophils %: 0.3 % (ref 0.0–1.0)
Basophils Absolute: 0.03 10*3/uL (ref 0.00–0.10)
Eosinophils %: 3.8 % (ref 0.0–7.0)
Eosinophils Absolute: 0.38 10*3/uL (ref 0.00–0.40)
Hematocrit: 32.8 % — ABNORMAL LOW (ref 35.0–47.0)
Hemoglobin: 11.4 g/dL — ABNORMAL LOW (ref 11.5–16.0)
Immature Granulocytes %: 0.3 % (ref 0–0.5)
Immature Granulocytes Absolute: 0.03 10*3/uL (ref 0.00–0.04)
Lymphocytes %: 46.8 % (ref 12.0–49.0)
Lymphocytes Absolute: 4.72 10*3/uL — ABNORMAL HIGH (ref 0.80–3.50)
MCH: 28.9 pg (ref 26.0–34.0)
MCHC: 34.8 g/dL (ref 30.0–36.5)
MCV: 83.2 FL (ref 80.0–99.0)
MPV: 9.1 FL (ref 8.9–12.9)
Monocytes %: 6.3 % (ref 5.0–13.0)
Monocytes Absolute: 0.64 10*3/uL (ref 0.00–1.00)
Neutrophils %: 42.5 % (ref 32.0–75.0)
Neutrophils Absolute: 4.29 10*3/uL (ref 1.80–8.00)
Nucleated RBCs: 0 /100{WBCs}
Platelets: 391 10*3/uL (ref 150–400)
RBC: 3.94 M/uL (ref 3.80–5.20)
RDW: 14.5 % (ref 11.5–14.5)
WBC: 10.1 10*3/uL (ref 3.6–11.0)
nRBC: 0 10*3/uL (ref 0.00–0.01)

## 2024-05-10 LAB — BASIC METABOLIC PANEL
Anion Gap: 7 mmol/L (ref 2–12)
BUN/Creatinine Ratio: 15 (ref 12–20)
BUN: 9 mg/dL (ref 6–20)
CO2: 27 mmol/L (ref 21–32)
Calcium: 8.7 mg/dL (ref 8.5–10.1)
Chloride: 104 mmol/L (ref 97–108)
Creatinine: 0.61 mg/dL (ref 0.55–1.02)
Est, Glom Filt Rate: >90 mL/min/{1.73_m2} (ref >60)
Glucose: 98 mg/dL (ref 65–100)
Potassium: 3.6 mmol/L (ref 3.5–5.1)
Sodium: 138 mmol/L (ref 136–145)

## 2024-05-10 LAB — URINALYSIS WITH REFLEX TO CULTURE
Bilirubin, Urine: NEGATIVE
Glucose, Ur: NEGATIVE mg/dL
Ketones, Urine: NEGATIVE mg/dL
Leukocyte Esterase, Urine: NEGATIVE
Nitrite, Urine: NEGATIVE
Specific Gravity, UA: >1.03 — ABNORMAL HIGH (ref 1.003–1.030)
Urobilinogen, Urine: 0.2 EU/dL (ref 0.2–1.0)
pH, Urine: 6 (ref 5.0–8.0)

## 2024-05-10 LAB — PROCALCITONIN: Procalcitonin: <0.05 ng/mL — ABNORMAL HIGH

## 2024-05-10 MED ORDER — CLONAZEPAM 0.5 MG PO TABS
0.5 | Freq: Every evening | ORAL | Status: DC
Start: 2024-05-10 — End: 2024-05-12
  Administered 2024-05-11 – 2024-05-12 (×2): 1 mg via ORAL

## 2024-05-10 MED ORDER — ACETAMINOPHEN 500 MG PO TABS
500 | Freq: Three times a day (TID) | ORAL | Status: AC
Start: 2024-05-10 — End: 2024-05-11
  Administered 2024-05-10 – 2024-05-12 (×6): 1000 mg via ORAL

## 2024-05-10 MED ORDER — TRAZODONE HCL 50 MG PO TABS
50 | Freq: Every evening | ORAL | Status: DC
Start: 2024-05-10 — End: 2024-05-12
  Administered 2024-05-11 – 2024-05-12 (×2): 25 mg via ORAL

## 2024-05-10 MED ORDER — HYDROXYZINE HCL 25 MG PO TABS
25 | Freq: Three times a day (TID) | ORAL | Status: DC | PRN
Start: 2024-05-10 — End: 2024-05-12

## 2024-05-10 MED FILL — DILAUDID 1 MG/ML IJ SOLN: 1 MG/ML | INTRAMUSCULAR | Qty: 0.5 | Fill #0

## 2024-05-10 MED FILL — MELATONIN 5 MG PO TBDP: 5 MG | ORAL | Qty: 1 | Fill #0

## 2024-05-10 MED FILL — MAG-AL PLUS 200-200-20 MG/5ML PO LIQD: 200-200-20 MG/5ML | ORAL | Qty: 30 | Fill #0

## 2024-05-10 MED FILL — KETOROLAC TROMETHAMINE 30 MG/ML IJ SOLN: 30 MG/ML | INTRAMUSCULAR | Qty: 1 | Fill #0

## 2024-05-10 MED FILL — ACETAMINOPHEN EXTRA STRENGTH 500 MG PO TABS: 500 MG | ORAL | Qty: 2 | Fill #0

## 2024-05-10 MED FILL — ENOXAPARIN SODIUM 40 MG/0.4ML IJ SOSY: 40 MG/0.4ML | INTRAMUSCULAR | Qty: 0.4 | Fill #0

## 2024-05-10 MED FILL — PANTOPRAZOLE SODIUM 40 MG IV SOLR: 40 MG | INTRAVENOUS | Qty: 40 | Fill #0

## 2024-05-10 MED FILL — PIPERACILLIN SOD-TAZOBACTAM SO 3.375 (3-0.375) G IV SOLR: 3.375 (3-0.375) g | INTRAVENOUS | Qty: 3375 | Fill #0

## 2024-05-10 MED FILL — NICOTINE STEP 2 14 MG/24HR TD PT24: 14 MG/24HR | TRANSDERMAL | Qty: 1 | Fill #0

## 2024-05-10 MED FILL — POTASSIUM CHLORIDE IN NACL 20-0.45 MEQ/L-% IV SOLN: 20-0.45 MEQ/L-% | INTRAVENOUS | Qty: 1000 | Fill #0

## 2024-05-10 NOTE — Progress Notes (Signed)
 Hospitalist Progress Note          Dr. Davie Essex MD      Date:05/10/2024       Room:211/01  Patient Name:Yolanda Weber     Date of Birth:[birth year]     Age:53 y.o.      Chief Complaint   Patient presents with    Chest Pain         HPI as per admitting provider on 05/09/2024  Yolanda Weber is a 53 y.o. female followed by No primary care provider on file. and  has a past medical history of Angiomyolipoma of both kidneys, Bulging lumbar disc, Diverticulosis, Fibromyalgia, Sciatica, and Sickle cell trait.    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  Last night prior to starting work she was having left arm and chest pain and stayed for short period time but she was still able to go to work.  It is noted that of 1 month back she had nuclear stress test performed that was found to be negative.  For the past 3 days she has been having multiple episodes of diarrhea especially when she eats any food but abdominal pain started this morning mainly in the epigastric and left upper quadrant and she has history of heartburn and recently appears to have been started on a PPI.  On evaluation blood pressure 144/91 heart rate of 95 O2 sat of 100% on room air she had a mild leukocytosis of 15 and BMP only abnormality noted was a potassium of 3.1.  Initial troponin was 4  CT of the abdomen pelvis showed moderate colonic diverticulosis with an area of subtotal pericolonic fat stranding around the splenic flexure with suspicion for mild or early diverticulitis.  She also has numerous chronic angiomyolipomas of bilateral kidney and had followed with urology in the past but not recently.  She initially was given a 1 L normal saline bolus 40 mEq oral potassium and IV Protonix and Zofran 4 mg.  She was given Flagyl dose as well as Levaquin and Toradol 15 mg IV x 1 she continued to have pain so Dilaudid 0.5 mg IV x 1 was given and was then referred for admission for acute diverticulitis         Discussed management with the ED provider Dr. Austin Leff, MD and agree with the plan for hospitalization       Subjective      05/10/2024: seen on follow up with nurse, resting in bed. States that she didn't get much rest. Still required dilaudid dosing on 5/16 PM and again early this morning x 2 doses.  Diarrhea has improved with none overnight.  Pain more located in the left upper quadrant.  No nausea or vomit afebrile      Review of Systems   Constitutional: Negative.    HENT: Negative.     Eyes: Negative.    Respiratory: Negative.     Cardiovascular: Negative.    Gastrointestinal: Negative.    Endocrine: Negative.    Genitourinary: Negative.    Musculoskeletal: Negative.    Skin: Negative.    Allergic/Immunologic: Negative.    Neurological: Negative.    Hematological: Negative.    Psychiatric/Behavioral: Negative.         Objective           Vitals Last 24 Hours:  Patient Vitals for the past 24 hrs:   Temp Pulse Resp BP SpO2   05/10/24 1114 -- -- 17 -- --  05/10/24 1110 98.2 F (36.8 C) 71 17 (!) 138/95 100 %   05/10/24 0723 97.5 F (36.4 C) 70 16 (!) 136/97 99 %   05/10/24 0705 -- 61 -- -- --   05/10/24 0700 -- 61 -- -- --   05/10/24 0556 -- -- 18 -- --   05/10/24 0526 -- -- 18 -- --   05/10/24 0346 97.7 F (36.5 C) 62 16 (!) 140/88 99 %   05/10/24 0003 -- 60 -- -- --   05/09/24 2345 97.5 F (36.4 C) 66 18 115/73 98 %   05/09/24 2144 -- -- 18 -- --   05/09/24 2114 -- -- 18 -- --   05/09/24 1938 98.2 F (36.8 C) 70 -- 133/89 100 %   05/09/24 1629 -- -- -- (!) 140/100 --   05/09/24 1620 97.7 F (36.5 C) 82 16 (!) 156/97 98 %   05/09/24 1612 -- -- 16 -- --   05/09/24 1542 -- -- 18 -- --   05/09/24 1442 -- 72 -- -- --   05/09/24 1342 -- 78 -- 136/85 100 %   05/09/24 1340 98.1 F (36.7 C) 68 17 (!) 126/108 100 %   05/09/24 1305 -- -- -- (!) 141/109 100 %   05/09/24 1235 -- -- -- (!) 155/99 100 %   05/09/24 1205 -- -- -- (!) 140/112 98 %   05/09/24 1135 -- -- -- (!) 148/96 97 %        I/O  (24Hr):    Intake/Output Summary (Last 24 hours) at 05/10/2024 1118  Last data filed at 05/10/2024 1029  Gross per 24 hour   Intake 2651.17 ml   Output 500 ml   Net 2151.17 ml       Physical Exam:  General: Alert, cooperative, no distress.  Head:   Normocephalic, without obvious abnormality, atraumatic.  Eyes:   Conjunctivae/corneas clear. Pupils equal, round, reactive to light. Extraocular movements intact.  Lungs:  Clear to auscultation bilaterally, no wheezes, crackles  Chest wall: No tenderness or deformity.  Heart:  Regular rate and rhythm, S1, S2 normal, no murmur, click, rub, or gallop.  Abdomen:        Soft,  left upper quadrant tenderness no guarding or rebound bowel sounds normal. No masses. No organomegaly.  Back:  No spine tenderness to palpation  Extremities:Extremities normal, atraumatic, no cyanosis or edema.  Pulses: Symmetric all extremities.  Skin:    Skin color, texture, turgor normal.   Lymph nodes: Cervical nodes normal.  Neurologic: CNII-XII intact. Normal strength, sensation, and reflexes throughout.      Active Medications           Current Facility-Administered Medications   Medication Dose Route Frequency    acetaminophen (TYLENOL) tablet 1,000 mg  1,000 mg Oral 3 times per day    traZODone (DESYREL) tablet 25 mg  25 mg Oral Nightly    sodium chloride flush 0.9 % injection 5-40 mL  5-40 mL IntraVENous 2 times per day    sodium chloride flush 0.9 % injection 5-40 mL  5-40 mL IntraVENous PRN    0.9 % sodium chloride infusion   IntraVENous PRN    potassium chloride (KLOR-CON M) extended release tablet 40 mEq  40 mEq Oral PRN    Or    potassium bicarb-citric acid (EFFER-K) effervescent tablet 40 mEq  40 mEq Oral PRN    Or    potassium chloride 10 mEq/100 mL IVPB (Peripheral Line)  10 mEq IntraVENous PRN  magnesium sulfate 2000 mg in 50 mL IVPB premix  2,000 mg IntraVENous PRN    ondansetron (ZOFRAN-ODT) disintegrating tablet 4 mg  4 mg Oral Q8H PRN    Or    ondansetron (ZOFRAN) injection 4 mg   4 mg IntraVENous Q6H PRN    polyethylene glycol (GLYCOLAX) packet 17 g  17 g Oral Daily PRN    acetaminophen (TYLENOL) tablet 650 mg  650 mg Oral Q6H PRN    Or    acetaminophen (TYLENOL) suppository 650 mg  650 mg Rectal Q6H PRN    pantoprazole (PROTONIX) 40 mg in sodium chloride (PF) 0.9 % 10 mL injection  40 mg IntraVENous BID    0.45 % NaCl with KCl 20 mEq infusion   IntraVENous Continuous    piperacillin-tazobactam (ZOSYN) 3,375 mg in sodium chloride 0.9 % 50 mL IVPB (addEASE)  3,375 mg IntraVENous Q8H    HYDROmorphone (DILAUDID) injection 0.5 mg  0.5 mg IntraVENous Q4H PRN    melatonin disintegrating tablet 5 mg  5 mg Oral Nightly    nicotine (NICODERM CQ) 14 MG/24HR 1 patch  1 patch TransDERmal Daily    clonazePAM (KLONOPIN) tablet 1 mg  1 mg Oral Q12H PRN    enoxaparin (LOVENOX) injection 40 mg  40 mg SubCUTAneous Daily         Allergies         Shellfish allergy and Ibuprofen       Labs/Imaging/Diagnostics      Labs:  Recent Results (from the past 48 hours)   EKG 12 Lead    Collection Time: 05/09/24  8:54 AM   Result Value Ref Range    Ventricular Rate 93 BPM    Atrial Rate 93 BPM    P-R Interval 123 ms    QRS Duration 90 ms    Q-T Interval 388 ms    QTc Calculation (Bazett) 483 ms    P Axis 71 degrees    R Axis 66 degrees    T Axis 56 degrees    Diagnosis Sinus rhythm  Biatrial enlargement      CBC with Auto Differential    Collection Time: 05/09/24  9:08 AM   Result Value Ref Range    WBC 15.0 (H) 3.6 - 11.0 K/uL    RBC 4.50 3.80 - 5.20 M/uL    Hemoglobin 13.0 11.5 - 16.0 g/dL    Hematocrit 86.5 78.4 - 47.0 %    MCV 82.2 80.0 - 99.0 FL    MCH 28.9 26.0 - 34.0 PG    MCHC 35.1 30.0 - 36.5 g/dL    RDW 69.6 (H) 29.5 - 14.5 %    Platelets 451 (H) 150 - 400 K/uL    MPV 9.4 8.9 - 12.9 FL    Nucleated RBCs 0.0 0.0 PER 100 WBC    nRBC 0.00 0.00 - 0.01 K/uL    Neutrophils % 53.0 32 - 75 %    Band Neutrophils 1 0 - 6 %    Lymphocytes % 37.0 12 - 49 %    Monocytes % 8.0 5 - 13 %    Eosinophils % 0.0 0 - 7 %     Basophils % 0.0 0 - 1 %    Immature Granulocytes % 1.0 %    Neutrophils Absolute 8.10 (H) 1.8 - 8.0 K/UL    Lymphocytes Absolute 5.55 (H) 0.8 - 3.5 K/UL    Monocytes Absolute 1.20 (H) 0.0 - 1.0 K/UL    Eosinophils Absolute 0.00  0.0 - 0.4 K/UL    Basophils Absolute 0.00 0.0 - 0.1 K/UL    Immature Granulocytes Absolute 0.15 K/UL    Differential Type Smear Scanned      RBC Comment Normocytic, Normochromic     Comprehensive Metabolic Panel    Collection Time: 05/09/24  9:08 AM   Result Value Ref Range    Sodium 144 136 - 145 mmol/L    Potassium 3.1 (L) 3.5 - 5.1 mmol/L    Chloride 105 97 - 108 mmol/L    CO2 29 21 - 32 mmol/L    Anion Gap 10 2 - 12 mmol/L    Glucose 113 (H) 65 - 100 mg/dL    BUN 15 6 - 20 mg/dL    Creatinine 1.61 0.96 - 1.02 mg/dL    BUN/Creatinine Ratio 16 12 - 20      Est, Glom Filt Rate 74 >60 ml/min/1.11m2    Calcium 9.6 8.5 - 10.1 mg/dL    Total Bilirubin 0.5 0.2 - 1.0 mg/dL    AST 26 15 - 37 U/L    ALT 47 12 - 78 U/L    Alk Phosphatase 103 45 - 117 U/L    Total Protein 7.5 6.4 - 8.2 g/dL    Albumin 3.5 3.5 - 5.0 g/dL    Globulin 4.0 2.0 - 4.0 g/dL    Albumin/Globulin Ratio 0.9 (L) 1.1 - 2.2     Troponin    Collection Time: 05/09/24  9:08 AM   Result Value Ref Range    Troponin, High Sensitivity 4 0 - 51 ng/L   Lipase    Collection Time: 05/09/24  9:08 AM   Result Value Ref Range    Lipase 47 13 - 75 U/L   Magnesium    Collection Time: 05/09/24  9:08 AM   Result Value Ref Range    Magnesium 1.9 1.6 - 2.4 mg/dL   Procalcitonin    Collection Time: 05/09/24  9:08 AM   Result Value Ref Range    Procalcitonin <0.05 (H) 0 ng/mL   Urinalysis with Reflex to Culture    Collection Time: 05/09/24 10:08 PM    Specimen: Urine   Result Value Ref Range    Color, UA Yellow/Straw      Appearance Clear Clear      Specific Gravity, UA >1.030 (H) 1.003 - 1.030    pH, Urine 6.0 5.0 - 8.0      Protein, UA Trace (A) Negative mg/dL    Glucose, Ur Negative Negative mg/dL    Ketones, Urine Negative Negative mg/dL     Bilirubin, Urine Negative Negative      Blood, Urine Small (A) Negative      Urobilinogen, Urine 0.2 0.2 - 1.0 EU/dL    Nitrite, Urine Negative Negative      Leukocyte Esterase, Urine Negative Negative      WBC, UA 0-4 0 - 5 /hpf    RBC, UA 5-10 0 - 3 /hpf    BACTERIA, URINE 1+ (A) Negative /hpf    Urine Culture if Indicated Culture not indicated by UA result Culture not indicated by UA result     CBC with Auto Differential    Collection Time: 05/10/24  5:55 AM   Result Value Ref Range    WBC 10.1 3.6 - 11.0 K/uL    RBC 3.94 3.80 - 5.20 M/uL    Hemoglobin 11.4 (L) 11.5 - 16.0 g/dL    Hematocrit 04.5 (L) 35.0 - 47.0 %  MCV 83.2 80.0 - 99.0 FL    MCH 28.9 26.0 - 34.0 PG    MCHC 34.8 30.0 - 36.5 g/dL    RDW 16.1 09.6 - 04.5 %    Platelets 391 150 - 400 K/uL    MPV 9.1 8.9 - 12.9 FL    Nucleated RBCs 0.0 0.0 PER 100 WBC    nRBC 0.00 0.00 - 0.01 K/uL    Neutrophils % 42.5 32.0 - 75.0 %    Lymphocytes % 46.8 12.0 - 49.0 %    Monocytes % 6.3 5.0 - 13.0 %    Eosinophils % 3.8 0.0 - 7.0 %    Basophils % 0.3 0.0 - 1.0 %    Immature Granulocytes % 0.3 0 - 0.5 %    Neutrophils Absolute 4.29 1.80 - 8.00 K/UL    Lymphocytes Absolute 4.72 (H) 0.80 - 3.50 K/UL    Monocytes Absolute 0.64 0.00 - 1.00 K/UL    Eosinophils Absolute 0.38 0.00 - 0.40 K/UL    Basophils Absolute 0.03 0.00 - 0.10 K/UL    Immature Granulocytes Absolute 0.03 0.00 - 0.04 K/UL    Differential Type AUTOMATED     Basic Metabolic Panel    Collection Time: 05/10/24  5:55 AM   Result Value Ref Range    Sodium 138 136 - 145 mmol/L    Potassium 3.6 3.5 - 5.1 mmol/L    Chloride 104 97 - 108 mmol/L    CO2 27 21 - 32 mmol/L    Anion Gap 7 2 - 12 mmol/L    Glucose 98 65 - 100 mg/dL    BUN 9 6 - 20 mg/dL    Creatinine 4.09 8.11 - 1.02 mg/dL    BUN/Creatinine Ratio 15 12 - 20      Est, Glom Filt Rate >90 >60 ml/min/1.52m2    Calcium 8.7 8.5 - 10.1 mg/dL        Imaging:  CT ABDOMEN PELVIS WO CONTRAST Additional Contrast? None  Result Date: 05/09/2024  1. Moderate colonic  diverticulosis, with an area of subtle pericolonic fat stranding around the splenic flecture, suspicious for very mild or early diverticulitis. 2. Numerous bilateral renal masses, most of which are confidently identified as angiomyolipomas. However, there are some small lesions which are not fully characterized. Recommend renal protocol abdominal MRI for more complete evaluation, or prior studies if available would be helpful to establish stability. Presence of multiple angiomyolipomas raises the suspicion for underlying systemic conditions including tuberous sclerosis, neurofibromatosis, and von Hippel-Lindau syndrome. 3. 3.9 cm exophytic uterine fibroid. Electronically signed by Valorie Gearing       Assessment//Plan           Problem List:  Hospital Problems           Last Modified POA    * (Principal) Diverticulitis 05/09/2024 Yes      Acute diverticulitis  -As noted on CT showing moderate diverticulosis with area of stranding  -Noted leukocytosis of 15  -Flagyl  and Levaquin  given in the emergency room will continue Zosyn  4.5 g every 6 hours  -Pain medications with IV Dilaudid  every 4 hours as needed  -Toradol  30 mg IV every 6 hours x 4 doses and discontinued on 5/17  -Clear liquid diet - > full liquid   -Stool studies for enteric bacteria still pending  -IV fluids discontinued on 5/17  - IV Protonix  40 twice daily and Mylanta prior to meals for possible gastritis//GERD symptoms  -5/17: Continues to have pain more  in the left upper quadrant with epigastric pain improved.  IV Toradol .  No effectiveness and since patient takes Tylenol  as needed for her fibromyalgia will do 1 g scheduled Tylenol  3 times daily continue as needed Dilaudid .  And advance diet to full liquid, initial leukocytosis resolved     Hypokalemia  - Mildly low at 3.1 and 40 mEq KCl x 1 given the emergency room  - Will supplement with 20 mEq KCl and IV fluids  - Repeat potassium at 3.6     Tobacco use  - Smokes about 5 to 6 cigarettes/day and wishes  for nicotine  patch  - Start 14 mg patch and change daily     Fibromyalgia  - States she only uses Tylenol  but placed on IV Toradol  and as needed Dilaudid  for diverticulitis pain  - Monitor closely  - Restarted home Klonopin  1 mg changed to every 12 hours as needed and will change to nightly as patient still unable to sleep well overnight  -Started trazodone  25 mg oral nightly     DVT prophylaxis  - Lovenox      CODE STATUS: Full code  Designates her father as a medical contact          Discussion/MDM: Patient with multiple medical comorbidities, each with high likelihood for morbidity and mortality if left untreated.   I have reviewed patient's presenting subjective and objective findings, as well as all laboratory studies, imaging studies, and vital signs to date as well as treatment rendered and patient's response to those treatments.  In addition, prior medical, surgical and relevant social and family histories were reviewed. I have discussed management plan with patient/family and with nursing staff.      Toxic drug monitoring:    This is dictation was done by dragon, Advertising account planner. Quite often unanticipated grammatical, syntax, homophones and other interpretive errors or inadvertently transcribed by the computer software. Please excuse errors that have escaped final proofreading. Thank you.       Spent 50 minutes evaluting and coordinating patients care      Electronically signed by Shterna Laramee R Jaecion Dempster, MD on 05/10/2024 at 11:18 AM

## 2024-05-10 NOTE — Progress Notes (Signed)
 Patient c/o abdominal pain, rates 8/10.  Administer Dilaudid.

## 2024-05-10 NOTE — Plan of Care (Signed)
 Problem: Pain  Goal: Verbalizes/displays adequate comfort level or baseline comfort level  05/10/2024 0701 by Karissa Meenan Lynn, RN  Outcome: Progressing  Flowsheets (Taken 05/10/2024 0701)  Verbalizes/displays adequate comfort level or baseline comfort level:   Encourage patient to monitor pain and request assistance   Assess pain using appropriate pain scale   Administer analgesics based on type and severity of pain and evaluate response   Implement non-pharmacological measures as appropriate and evaluate response  05/09/2024 2200 by Audrea Learned, LPN  Outcome: Progressing  05/09/2024 1731 by Arelys Glassco Lynn, RN  Outcome: Progressing  Flowsheets (Taken 05/09/2024 1731)  Verbalizes/displays adequate comfort level or baseline comfort level:   Encourage patient to monitor pain and request assistance   Assess pain using appropriate pain scale   Administer analgesics based on type and severity of pain and evaluate response

## 2024-05-11 ENCOUNTER — Observation Stay: Admit: 2024-05-11 | Payer: Medicaid (Managed Care) | Primary: Internal Medicine

## 2024-05-11 LAB — BASIC METABOLIC PANEL
Anion Gap: 9 mmol/L (ref 2–12)
BUN/Creatinine Ratio: 6 — ABNORMAL LOW (ref 12–20)
BUN: 4 mg/dL — ABNORMAL LOW (ref 6–20)
CO2: 28 mmol/L (ref 21–32)
Calcium: 9.3 mg/dL (ref 8.5–10.1)
Chloride: 105 mmol/L (ref 97–108)
Creatinine: 0.66 mg/dL (ref 0.55–1.02)
Est, Glom Filt Rate: >90 mL/min/{1.73_m2} (ref >60)
Glucose: 91 mg/dL (ref 65–100)
Potassium: 3.4 mmol/L — ABNORMAL LOW (ref 3.5–5.1)
Sodium: 142 mmol/L (ref 136–145)

## 2024-05-11 LAB — EKG 12-LEAD
Atrial Rate: 93 {beats}/min
P Axis: 71 degrees
P-R Interval: 123 ms
Q-T Interval: 388 ms
QRS Duration: 90 ms
QTc Calculation (Bazett): 483 ms
R Axis: 66 degrees
T Axis: 56 degrees
Ventricular Rate: 93 {beats}/min

## 2024-05-11 MED ORDER — POTASSIUM CHLORIDE CRYS ER 20 MEQ PO TBCR
20 | Freq: Two times a day (BID) | ORAL | Status: DC
Start: 2024-05-11 — End: 2024-05-12
  Administered 2024-05-11 – 2024-05-12 (×3): 20 meq via ORAL

## 2024-05-11 MED ORDER — MELOXICAM 7.5 MG PO TABS
7.5 | Freq: Every day | ORAL | Status: DC
Start: 2024-05-11 — End: 2024-05-12
  Administered 2024-05-11 – 2024-05-12 (×2): 15 mg via ORAL

## 2024-05-11 MED ORDER — HYDROMORPHONE HCL 1 MG/ML IJ SOLN
1 | Freq: Four times a day (QID) | INTRAMUSCULAR | Status: DC | PRN
Start: 2024-05-11 — End: 2024-05-12
  Administered 2024-05-11 – 2024-05-12 (×2): 0.25 mg via INTRAVENOUS

## 2024-05-11 MED ORDER — METRONIDAZOLE 500 MG/100ML IV SOLN
500 | Freq: Three times a day (TID) | INTRAVENOUS | Status: DC
Start: 2024-05-11 — End: 2024-05-12
  Administered 2024-05-11 – 2024-05-12 (×3): 500 mg via INTRAVENOUS

## 2024-05-11 MED ORDER — FLUCONAZOLE 100 MG PO TABS
100 | Freq: Once | ORAL | Status: AC
Start: 2024-05-11 — End: 2024-05-10
  Administered 2024-05-11: 01:00:00 150 mg via ORAL

## 2024-05-11 MED FILL — ACETAMINOPHEN EXTRA STRENGTH 500 MG PO TABS: 500 MG | ORAL | Qty: 2 | Fill #0

## 2024-05-11 MED FILL — PIPERACILLIN SOD-TAZOBACTAM SO 3.375 (3-0.375) G IV SOLR: 3.375 (3-0.375) g | INTRAVENOUS | Qty: 3375 | Fill #0

## 2024-05-11 MED FILL — TRAZODONE HCL 50 MG PO TABS: 50 MG | ORAL | Qty: 1 | Fill #0

## 2024-05-11 MED FILL — CLONAZEPAM 0.5 MG PO TABS: 0.5 MG | ORAL | Qty: 2 | Fill #0

## 2024-05-11 MED FILL — MELOXICAM 7.5 MG PO TABS: 7.5 MG | ORAL | Qty: 2 | Fill #0

## 2024-05-11 MED FILL — POTASSIUM CHLORIDE CRYS ER 20 MEQ PO TBCR: 20 MEQ | ORAL | Qty: 1 | Fill #0

## 2024-05-11 MED FILL — DILAUDID 1 MG/ML IJ SOLN: 1 MG/ML | INTRAMUSCULAR | Qty: 0.5 | Fill #0

## 2024-05-11 MED FILL — NICOTINE STEP 2 14 MG/24HR TD PT24: 14 MG/24HR | TRANSDERMAL | Qty: 1 | Fill #0

## 2024-05-11 MED FILL — PANTOPRAZOLE SODIUM 40 MG IV SOLR: 40 MG | INTRAVENOUS | Qty: 40 | Fill #0

## 2024-05-11 MED FILL — METRONIDAZOLE 500 MG/100ML IV SOLN: 500 MG/100ML | INTRAVENOUS | Qty: 100 | Fill #0

## 2024-05-11 MED FILL — FLUCONAZOLE 100 MG PO TABS: 100 MG | ORAL | Qty: 2 | Fill #0

## 2024-05-11 MED FILL — ENOXAPARIN SODIUM 40 MG/0.4ML IJ SOSY: 40 MG/0.4ML | INTRAMUSCULAR | Qty: 0.4 | Fill #0

## 2024-05-11 NOTE — Progress Notes (Signed)
 Patient's IV site becoming very positional. New IV site attempted on left wrist with out success.  Messaged ER if nurse can come assist when possible.

## 2024-05-11 NOTE — Progress Notes (Signed)
 Has had pain med x 2 for pain level of 7-8 this shift.  ABT continues.  Did receive a dose of Diflucan for S&S of yeast infection.

## 2024-05-11 NOTE — Progress Notes (Signed)
 ER nurse unsuccessful with IV stick-  IV med still infusing in left arm site .

## 2024-05-11 NOTE — Plan of Care (Signed)
 Problem: Pain  Goal: Verbalizes/displays adequate comfort level or baseline comfort level  05/11/2024 0731 by Elsa Ploch Lynn, RN  Outcome: Progressing  Flowsheets (Taken 05/11/2024 [phone])  Verbalizes/displays adequate comfort level or baseline comfort level:   Encourage patient to monitor pain and request assistance   Assess pain using appropriate pain scale   Administer analgesics based on type and severity of pain and evaluate response  05/11/2024 0518 by Niels Barry, RN  Flowsheets (Taken 05/10/2024 0701 by Claudio Culver, RN)  Verbalizes/displays adequate comfort level or baseline comfort level:   Encourage patient to monitor pain and request assistance   Assess pain using appropriate pain scale   Administer analgesics based on type and severity of pain and evaluate response   Implement non-pharmacological measures as appropriate and evaluate response

## 2024-05-11 NOTE — Progress Notes (Signed)
 Hospitalist Progress Note          Dr. Mozes Sagar MD      Date:05/11/2024       Room:211/01  Patient Name:Yolanda Weber     Date of Birth:1971-05-25     Age:53 y.o.      Chief Complaint   Patient presents with    Chest Pain         HPI as per admitting provider on 05/09/2024  Yolanda Weber is a 53 y.o. female followed by No primary care provider on file. and  has a past medical history of Angiomyolipoma of both kidneys, Bulging lumbar disc, Diverticulosis, Fibromyalgia, Sciatica, and Sickle cell trait.    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  Last night prior to starting work she was having left arm and chest pain and stayed for short period time but she was still able to go to work.  It is noted that of 1 month back she had nuclear stress test performed that was found to be negative.  For the past 3 days she has been having multiple episodes of diarrhea especially when she eats any food but abdominal pain started this morning mainly in the epigastric and left upper quadrant and she has history of heartburn and recently appears to have been started on a PPI.  On evaluation blood pressure 144/91 heart rate of 95 O2 sat of 100% on room air she had a mild leukocytosis of 15 and BMP only abnormality noted was a potassium of 3.1.  Initial troponin was 4  CT of the abdomen pelvis showed moderate colonic diverticulosis with an area of subtotal pericolonic fat stranding around the splenic flexure with suspicion for mild or early diverticulitis.  She also has numerous chronic angiomyolipomas of bilateral kidney and had followed with urology in the past but not recently.  She initially was given a 1 L normal saline bolus 40 mEq oral potassium and IV Protonix and Zofran 4 mg.  She was given Flagyl dose as well as Levaquin and Toradol 15 mg IV x 1 she continued to have pain so Dilaudid 0.5 mg IV x 1 was given and was then referred for admission for acute diverticulitis         Discussed management with the ED provider Dr. Austin Leff, MD and agree with the plan for hospitalization       Subjective      05/11/2024: slept well overnight, no nausea or vomiting, still with left upper quadrant pain with mild guarding on palpation. Had one episode of diarrhea overnight. Dilauded already two doses this AM and 4 doses total on 5/17    05/10/24: seen on follow up with nurse, resting in bed. States that she didn't get much rest. Still required dilaudid dosing on 5/16 PM and again early this morning x 2 doses.  Diarrhea has improved with none overnight.  Pain more located in the left upper quadrant.  No nausea or vomit afebrile      Review of Systems   Constitutional: Negative.    HENT: Negative.     Eyes: Negative.    Respiratory: Negative.     Cardiovascular: Negative.    Gastrointestinal: Negative.    Endocrine: Negative.    Genitourinary: Negative.    Musculoskeletal: Negative.    Skin: Negative.    Allergic/Immunologic: Negative.    Neurological: Negative.    Hematological: Negative.    Psychiatric/Behavioral: Negative.  Objective           Vitals Last 24 Hours:  Patient Vitals for the past 24 hrs:   Temp Pulse Resp BP SpO2   05/11/24 0809 -- -- 16 -- --   05/11/24 0714 97.9 F (36.6 C) 68 16 (!) 140/87 98 %   05/11/24 0355 -- -- 17 -- --   05/11/24 0330 97.9 F (36.6 C) 62 18 (!) 162/102 100 %   05/11/24 0325 -- -- 18 -- --   05/10/24 2335 97.9 F (36.6 C) 62 18 (!) 142/86 98 %   05/10/24 2207 -- -- 17 -- --   05/10/24 2137 -- -- 17 -- --   05/10/24 1957 98.6 F (37 C) 66 18 (!) 159/102 98 %   05/10/24 1744 -- -- 16 -- --   05/10/24 1714 -- -- 16 -- --   05/10/24 1600 98.4 F (36.9 C) 67 16 -- 100 %   05/10/24 1144 -- -- 16 -- --   05/10/24 1114 -- -- 17 -- --   05/10/24 1110 98.2 F (36.8 C) 71 17 (!) 138/95 100 %        I/O (24Hr):    Intake/Output Summary (Last 24 hours) at 05/11/2024 0936  Last data filed at 05/11/2024 1610  Gross per 24 hour   Intake 2557.33 ml   Output 3500 ml    Net -942.67 ml       Physical Exam:  General: Alert, cooperative, no distress.  Head:   Normocephalic, without obvious abnormality, atraumatic.  Eyes:   Conjunctivae/corneas clear. Pupils equal, round, reactive to light. Extraocular movements intact.  Lungs:  Clear to auscultation bilaterally, no wheezes, crackles  Chest wall: No tenderness or deformity.  Heart:  Regular rate and rhythm, S1, S2 normal, no murmur, click, rub, or gallop.  Abdomen:        Soft,  left upper quadrant tenderness, mild guarding or rebound bowel sounds normal. No masses. No organomegaly.  Back:  No spine tenderness to palpation  Extremities:Extremities normal, atraumatic, no cyanosis or edema.  Pulses: Symmetric all extremities.  Skin:    Skin color, texture, turgor normal.   Lymph nodes: Cervical nodes normal.  Neurologic: CNII-XII intact. Normal strength, sensation, and reflexes throughout.      Active Medications           Current Facility-Administered Medications   Medication Dose Route Frequency    metroNIDAZOLE (FLAGYL) 500 mg in 0.9% NaCl 100 mL IVPB premix  500 mg IntraVENous Q8H    acetaminophen (TYLENOL) tablet 1,000 mg  1,000 mg Oral 3 times per day    traZODone (DESYREL) tablet 25 mg  25 mg Oral Nightly    clonazePAM (KLONOPIN) tablet 1 mg  1 mg Oral Nightly    hydrOXYzine HCl (ATARAX) tablet 25 mg  25 mg Oral TID PRN    sodium chloride flush 0.9 % injection 5-40 mL  5-40 mL IntraVENous 2 times per day    sodium chloride flush 0.9 % injection 5-40 mL  5-40 mL IntraVENous PRN    0.9 % sodium chloride infusion   IntraVENous PRN    potassium chloride (KLOR-CON M) extended release tablet 40 mEq  40 mEq Oral PRN    Or    potassium bicarb-citric acid (EFFER-K) effervescent tablet 40 mEq  40 mEq Oral PRN    Or    potassium chloride 10 mEq/100 mL IVPB (Peripheral Line)  10 mEq IntraVENous PRN    magnesium sulfate 2000 mg  in 50 mL IVPB premix  2,000 mg IntraVENous PRN    ondansetron (ZOFRAN-ODT) disintegrating tablet 4 mg  4 mg Oral  Q8H PRN    Or    ondansetron (ZOFRAN) injection 4 mg  4 mg IntraVENous Q6H PRN    polyethylene glycol (GLYCOLAX) packet 17 g  17 g Oral Daily PRN    acetaminophen (TYLENOL) tablet 650 mg  650 mg Oral Q6H PRN    Or    acetaminophen (TYLENOL) suppository 650 mg  650 mg Rectal Q6H PRN    piperacillin-tazobactam (ZOSYN) 3,375 mg in sodium chloride 0.9 % 50 mL IVPB (addEASE)  3,375 mg IntraVENous Q8H    HYDROmorphone (DILAUDID) injection 0.5 mg  0.5 mg IntraVENous Q4H PRN    nicotine (NICODERM CQ) 14 MG/24HR 1 patch  1 patch TransDERmal Daily    enoxaparin (LOVENOX) injection 40 mg  40 mg SubCUTAneous Daily         Allergies         Shellfish allergy and Ibuprofen       Labs/Imaging/Diagnostics      Labs:  Recent Results (from the past 48 hours)   Urinalysis with Reflex to Culture    Collection Time: 05/09/24 10:08 PM    Specimen: Urine   Result Value Ref Range    Color, UA Yellow/Straw      Appearance Clear Clear      Specific Gravity, UA >1.030 (H) 1.003 - 1.030    pH, Urine 6.0 5.0 - 8.0      Protein, UA Trace (A) Negative mg/dL    Glucose, Ur Negative Negative mg/dL    Ketones, Urine Negative Negative mg/dL    Bilirubin, Urine Negative Negative      Blood, Urine Small (A) Negative      Urobilinogen, Urine 0.2 0.2 - 1.0 EU/dL    Nitrite, Urine Negative Negative      Leukocyte Esterase, Urine Negative Negative      WBC, UA 0-4 0 - 5 /hpf    RBC, UA 5-10 0 - 3 /hpf    BACTERIA, URINE 1+ (A) Negative /hpf    Urine Culture if Indicated Culture not indicated by UA result Culture not indicated by UA result     CBC with Auto Differential    Collection Time: 05/10/24  5:55 AM   Result Value Ref Range    WBC 10.1 3.6 - 11.0 K/uL    RBC 3.94 3.80 - 5.20 M/uL    Hemoglobin 11.4 (L) 11.5 - 16.0 g/dL    Hematocrit 19.1 (L) 35.0 - 47.0 %    MCV 83.2 80.0 - 99.0 FL    MCH 28.9 26.0 - 34.0 PG    MCHC 34.8 30.0 - 36.5 g/dL    RDW 47.8 29.5 - 62.1 %    Platelets 391 150 - 400 K/uL    MPV 9.1 8.9 - 12.9 FL    Nucleated RBCs 0.0 0.0 PER  100 WBC    nRBC 0.00 0.00 - 0.01 K/uL    Neutrophils % 42.5 32.0 - 75.0 %    Lymphocytes % 46.8 12.0 - 49.0 %    Monocytes % 6.3 5.0 - 13.0 %    Eosinophils % 3.8 0.0 - 7.0 %    Basophils % 0.3 0.0 - 1.0 %    Immature Granulocytes % 0.3 0 - 0.5 %    Neutrophils Absolute 4.29 1.80 - 8.00 K/UL    Lymphocytes Absolute 4.72 (H) 0.80 - 3.50 K/UL  Monocytes Absolute 0.64 0.00 - 1.00 K/UL    Eosinophils Absolute 0.38 0.00 - 0.40 K/UL    Basophils Absolute 0.03 0.00 - 0.10 K/UL    Immature Granulocytes Absolute 0.03 0.00 - 0.04 K/UL    Differential Type AUTOMATED     Basic Metabolic Panel    Collection Time: 05/10/24  5:55 AM   Result Value Ref Range    Sodium 138 136 - 145 mmol/L    Potassium 3.6 3.5 - 5.1 mmol/L    Chloride 104 97 - 108 mmol/L    CO2 27 21 - 32 mmol/L    Anion Gap 7 2 - 12 mmol/L    Glucose 98 65 - 100 mg/dL    BUN 9 6 - 20 mg/dL    Creatinine 4.54 0.98 - 1.02 mg/dL    BUN/Creatinine Ratio 15 12 - 20      Est, Glom Filt Rate >90 >60 ml/min/1.91m2    Calcium 8.7 8.5 - 10.1 mg/dL   Basic Metabolic Panel    Collection Time: 05/11/24  6:47 AM   Result Value Ref Range    Sodium 142 136 - 145 mmol/L    Potassium 3.4 (L) 3.5 - 5.1 mmol/L    Chloride 105 97 - 108 mmol/L    CO2 28 21 - 32 mmol/L    Anion Gap 9 2 - 12 mmol/L    Glucose 91 65 - 100 mg/dL    BUN 4 (L) 6 - 20 mg/dL    Creatinine 1.19 1.47 - 1.02 mg/dL    BUN/Creatinine Ratio 6 (L) 12 - 20      Est, Glom Filt Rate >90 >60 ml/min/1.2m2    Calcium 9.3 8.5 - 10.1 mg/dL        Imaging:  CT ABDOMEN PELVIS WO CONTRAST Additional Contrast? None  Result Date: 05/09/2024  1. Moderate colonic diverticulosis, with an area of subtle pericolonic fat stranding around the splenic flecture, suspicious for very mild or early diverticulitis. 2. Numerous bilateral renal masses, most of which are confidently identified as angiomyolipomas. However, there are some small lesions which are not fully characterized. Recommend renal protocol abdominal MRI for more complete  evaluation, or prior studies if available would be helpful to establish stability. Presence of multiple angiomyolipomas raises the suspicion for underlying systemic conditions including tuberous sclerosis, neurofibromatosis, and von Hippel-Lindau syndrome. 3. 3.9 cm exophytic uterine fibroid. Electronically signed by Valorie Gearing       Assessment//Plan           Problem List:  Hospital Problems           Last Modified POA    * (Principal) Diverticulitis 05/09/2024 Yes      Acute diverticulitis  -As noted on CT showing moderate diverticulosis with area of stranding  -Noted leukocytosis of 15  -Flagyl and Levaquin given in the emergency room will continue Zosyn 4.5 g every 6 hours  -Pain medications with IV Dilaudid every 4 hours as needed  -Toradol 30 mg IV every 6 hours x 4 doses and discontinued on 5/17  -Clear liquid diet - > full liquid   -Stool studies for enteric bacteria still pending  -IV fluids discontinued on 5/17  - IV Protonix 40 twice daily and Mylanta prior to meals for possible gastritis//GERD symptoms  -5/17: Continues to have pain more in the left upper quadrant with epigastric pain improved.  IV Toradol.  No effectiveness and since patient takes Tylenol as needed for her fibromyalgia will do 1 g  scheduled Tylenol 3 times daily continue as needed Dilaudid.  And advance diet to full liquid, initial leukocytosis resolved  - 5/18: still having LUQ pain and needed 4 dose of dilauded on 5/17 and already two doses this AM. No nausea or vomiting. One episode off loose stool. Obtain CT abdomen / pelvis to reevaluate since still having pain. Add flagyl IV q8hrs.   Consult GI when available if symptoms not resolving and or worsening CT      Hypokalemia  - Mildly low at 3.1 and 40 mEq KCl x 1 given the emergency room  - Will supplement with 20 mEq KCl and IV fluids  - K: 3.1 -> 3.6 -> 3.4  - repeat on 5/18 at 3.4 and supplement orally 20meq kcl bid   - follow daily bmp      Tobacco use  - Smokes about 5 to 6  cigarettes/day and wishes for nicotine patch  - Start 14 mg patch and change daily     Fibromyalgia  - States she only uses Tylenol but placed on IV Toradol and as needed Dilaudid for diverticulitis pain  - Monitor closely  - Restarted home Klonopin 1 mg changed to every 12 hours as needed and will change to nightly as patient still unable to sleep well overnight  -Started trazodone 25 mg oral nightly  - 5/18: still asking for dilauded for left upper quadrant pain      DVT prophylaxis  - Lovenox     CODE STATUS: Full code  Designates her father as a medical contact          Discussion/MDM: Patient with multiple medical comorbidities, each with high likelihood for morbidity and mortality if left untreated.   I have reviewed patient's presenting subjective and objective findings, as well as all laboratory studies, imaging studies, and vital signs to date as well as treatment rendered and patient's response to those treatments.  In addition, prior medical, surgical and relevant social and family histories were reviewed. I have discussed management plan with patient/family and with nursing staff.      Toxic drug monitoring:    This is dictation was done by dragon, Advertising account planner. Quite often unanticipated grammatical, syntax, homophones and other interpretive errors or inadvertently transcribed by the computer software. Please excuse errors that have escaped final proofreading. Thank you.       Spent 50 minutes evaluting and coordinating patients care      Electronically signed by Merced Brougham R Ladon Heney, MD on 05/11/2024 at 9:36 AM

## 2024-05-11 NOTE — Plan of Care (Signed)
 Problem: Pain  Goal: Verbalizes/displays adequate comfort level or baseline comfort level  Flowsheets (Taken 05/10/2024 0701 by Claudio Culver, RN)  Verbalizes/displays adequate comfort level or baseline comfort level:   Encourage patient to monitor pain and request assistance   Assess pain using appropriate pain scale   Administer analgesics based on type and severity of pain and evaluate response   Implement non-pharmacological measures as appropriate and evaluate response

## 2024-05-12 LAB — CBC WITH AUTO DIFFERENTIAL
Basophils %: 0.3 % (ref 0.0–1.0)
Basophils Absolute: 0.03 10*3/uL (ref 0.00–0.10)
Eosinophils %: 4.9 % (ref 0.0–7.0)
Eosinophils Absolute: 0.44 10*3/uL — ABNORMAL HIGH (ref 0.00–0.40)
Hematocrit: 36.6 % (ref 35.0–47.0)
Hemoglobin: 13 g/dL (ref 11.5–16.0)
Immature Granulocytes %: 0.2 % (ref 0–0.5)
Immature Granulocytes Absolute: 0.02 10*3/uL (ref 0.00–0.04)
Lymphocytes %: 42 % (ref 12.0–49.0)
Lymphocytes Absolute: 3.8 10*3/uL — ABNORMAL HIGH (ref 0.80–3.50)
MCH: 29 pg (ref 26.0–34.0)
MCHC: 35.5 g/dL (ref 30.0–36.5)
MCV: 81.7 FL (ref 80.0–99.0)
MPV: 9 FL (ref 8.9–12.9)
Monocytes %: 8.4 % (ref 5.0–13.0)
Monocytes Absolute: 0.76 10*3/uL (ref 0.00–1.00)
Neutrophils %: 44.2 % (ref 32.0–75.0)
Neutrophils Absolute: 3.99 10*3/uL (ref 1.80–8.00)
Nucleated RBCs: 0 /100{WBCs}
Platelets: 410 10*3/uL — ABNORMAL HIGH (ref 150–400)
RBC: 4.48 M/uL (ref 3.80–5.20)
RDW: 14.4 % (ref 11.5–14.5)
WBC: 9 10*3/uL (ref 3.6–11.0)
nRBC: 0 10*3/uL (ref 0.00–0.01)

## 2024-05-12 LAB — COMPREHENSIVE METABOLIC PANEL
ALT: 40 U/L (ref 12–78)
AST: 22 U/L (ref 15–37)
Albumin/Globulin Ratio: 0.9 — ABNORMAL LOW (ref 1.1–2.2)
Albumin: 3.1 g/dL — ABNORMAL LOW (ref 3.5–5.0)
Alk Phosphatase: 87 U/L (ref 45–117)
Anion Gap: 8 mmol/L (ref 2–12)
BUN/Creatinine Ratio: 7 — ABNORMAL LOW (ref 12–20)
BUN: 4 mg/dL — ABNORMAL LOW (ref 6–20)
CO2: 28 mmol/L (ref 21–32)
Calcium: 9 mg/dL (ref 8.5–10.1)
Chloride: 103 mmol/L (ref 97–108)
Creatinine: 0.56 mg/dL (ref 0.55–1.02)
Est, Glom Filt Rate: >90 mL/min/{1.73_m2} (ref >60)
Globulin: 3.6 g/dL (ref 2.0–4.0)
Glucose: 96 mg/dL (ref 65–100)
Potassium: 3.5 mmol/L (ref 3.5–5.1)
Sodium: 139 mmol/L (ref 136–145)
Total Bilirubin: 0.5 mg/dL (ref 0.2–1.0)
Total Protein: 6.7 g/dL (ref 6.4–8.2)

## 2024-05-12 LAB — ENTERIC BACT PANEL, DNA
CAMPYLOBACTER SPECIES, DNA: NEGATIVE
ENTEROTOXIGEN E COLI, DNA: NEGATIVE
P. SHIGELLOIDES, DNA: NEGATIVE
SALMONELLA SPECIES, DNA: NEGATIVE
SHIGA TOXIN PRODUCING, DNA: NEGATIVE
SHIGELLA SPECIES, DNA: NEGATIVE
VIBRIO SPECIES, DNA: NEGATIVE
Y. ENTEROCOLITICA, DNA: NEGATIVE

## 2024-05-12 LAB — MAGNESIUM: Magnesium: 1.9 mg/dL (ref 1.6–2.4)

## 2024-05-12 MED ORDER — CULTURELLE PO CAPS
Freq: Every day | ORAL | Status: DC
Start: 2024-05-12 — End: 2024-05-12

## 2024-05-12 MED ORDER — AMOXICILLIN-POT CLAVULANATE 875-125 MG PO TABS
875-125 | ORAL_TABLET | Freq: Two times a day (BID) | ORAL | 0 refills | 7.00 days | Status: AC
Start: 2024-05-12 — End: 2024-05-19

## 2024-05-12 MED ORDER — PANTOPRAZOLE SODIUM 40 MG PO TBEC
40 | ORAL_TABLET | Freq: Two times a day (BID) | ORAL | 3 refills | 30.00000 days | Status: DC
Start: 2024-05-12 — End: 2024-10-10

## 2024-05-12 MED ORDER — CHOLESTYRAMINE LIGHT 4 G PO PACK
4 | PACK | Freq: Two times a day (BID) | ORAL | 0 refills | 30.00000 days | Status: DC
Start: 2024-05-12 — End: 2024-07-16

## 2024-05-12 MED ORDER — SUCRALFATE 1 G PO TABS
1 | ORAL_TABLET | Freq: Four times a day (QID) | ORAL | 0 refills | 30.00000 days | Status: DC
Start: 2024-05-12 — End: 2024-07-16

## 2024-05-12 MED ORDER — CULTURELLE PO CAPS
ORAL_CAPSULE | Freq: Every day | ORAL | 0 refills | 28.00000 days | Status: DC
Start: 2024-05-12 — End: 2024-07-16

## 2024-05-12 MED ORDER — LOPERAMIDE HCL 2 MG PO TABS
2 | ORAL_TABLET | Freq: Three times a day (TID) | ORAL | 0 refills | 7.00 days | Status: AC | PRN
Start: 2024-05-12 — End: 2024-05-22

## 2024-05-12 MED ORDER — CHOLESTYRAMINE LIGHT 4 G PO PACK
4 | Freq: Two times a day (BID) | ORAL | Status: DC
Start: 2024-05-12 — End: 2024-05-12

## 2024-05-12 MED ORDER — LOPERAMIDE HCL 2 MG PO CAPS
2 | Freq: Once | ORAL | Status: AC
Start: 2024-05-12 — End: 2024-05-12
  Administered 2024-05-12: 14:00:00 2 mg via ORAL

## 2024-05-12 MED ORDER — OXYCODONE HCL 5 MG PO TABS
5 | Freq: Once | ORAL | Status: AC
Start: 2024-05-12 — End: 2024-05-12
  Administered 2024-05-12: 14:00:00 5 mg via ORAL

## 2024-05-12 MED FILL — DILAUDID 1 MG/ML IJ SOLN: 1 MG/ML | INTRAMUSCULAR | Qty: 0.5 | Fill #0

## 2024-05-12 MED FILL — ACETAMINOPHEN EXTRA STRENGTH 500 MG PO TABS: 500 MG | ORAL | Qty: 2 | Fill #0

## 2024-05-12 MED FILL — ENOXAPARIN SODIUM 40 MG/0.4ML IJ SOSY: 40 MG/0.4ML | INTRAMUSCULAR | Qty: 0.4 | Fill #0

## 2024-05-12 MED FILL — PIPERACILLIN SOD-TAZOBACTAM SO 3.375 (3-0.375) G IV SOLR: 3.375 (3-0.375) g | INTRAVENOUS | Qty: 3375 | Fill #0

## 2024-05-12 MED FILL — METRONIDAZOLE 500 MG/100ML IV SOLN: 500 MG/100ML | INTRAVENOUS | Qty: 100 | Fill #0

## 2024-05-12 MED FILL — TRAZODONE HCL 50 MG PO TABS: 50 MG | ORAL | Qty: 1 | Fill #0

## 2024-05-12 MED FILL — LOPERAMIDE HCL 2 MG PO CAPS: 2 MG | ORAL | Qty: 1 | Fill #0

## 2024-05-12 MED FILL — MELOXICAM 7.5 MG PO TABS: 7.5 MG | ORAL | Qty: 2 | Fill #0

## 2024-05-12 MED FILL — POTASSIUM CHLORIDE CRYS ER 20 MEQ PO TBCR: 20 MEQ | ORAL | Qty: 1 | Fill #0

## 2024-05-12 MED FILL — OXYCODONE HCL 5 MG PO TABS: 5 MG | ORAL | Qty: 1 | Fill #0

## 2024-05-12 MED FILL — NICOTINE STEP 2 14 MG/24HR TD PT24: 14 MG/24HR | TRANSDERMAL | Qty: 1 | Fill #0

## 2024-05-12 MED FILL — CLONAZEPAM 0.5 MG PO TABS: 0.5 MG | ORAL | Qty: 2 | Fill #0

## 2024-05-12 NOTE — Plan of Care (Signed)
 Problem: Pain  Goal: Verbalizes/displays adequate comfort level or baseline comfort level  05/12/2024 0759 by Nuala Bel, RN  Outcome: Adequate for Discharge  05/12/2024 0339 by Niels Barry, RN  Outcome: Progressing  Flowsheets (Taken 05/11/2024 0731 by Claudio Culver, RN)  Verbalizes/displays adequate comfort level or baseline comfort level:   Encourage patient to monitor pain and request assistance   Assess pain using appropriate pain scale   Administer analgesics based on type and severity of pain and evaluate response     Problem: Gastrointestinal - Adult  Goal: Minimal or absence of nausea and vomiting  05/12/2024 0759 by Nuala Bel, RN  Outcome: Adequate for Discharge  05/12/2024 0339 by Niels Barry, RN  Outcome: Progressing  Flowsheets (Taken 05/12/2024 [phone])  Minimal or absence of nausea and vomiting:   Administer IV fluids as ordered to ensure adequate hydration   Administer ordered antiemetic medications as needed   Advance diet as tolerated, if ordered   Provide nonpharmacologic comfort measures as appropriate  Goal: Maintains or returns to baseline bowel function  05/12/2024 0759 by Nuala Bel, RN  Outcome: Adequate for Discharge  05/12/2024 0339 by Niels Barry, RN  Outcome: Progressing  Flowsheets (Taken 05/12/2024 [phone])  Maintains or returns to baseline bowel function:   Assess bowel function   Encourage oral fluids to ensure adequate hydration   Administer ordered medications as needed   Encourage mobilization and activity  Goal: Maintains adequate nutritional intake  05/12/2024 0759 by Nuala Bel, RN  Outcome: Adequate for Discharge  05/12/2024 0339 by Niels Barry, RN  Outcome: Progressing  Flowsheets (Taken 05/12/2024 [phone])  Maintains adequate nutritional intake:   Monitor percentage of each meal consumed   Identify factors contributing to decreased intake, treat as appropriate   Assist with meals as needed   Monitor intake and output, weight and lab values

## 2024-05-12 NOTE — Care Coordination-Inpatient (Signed)
 05/12/24 0852   Service Assessment   Patient Orientation Alert and Oriented   Cognition Alert   History Provided By Patient   Primary Caregiver Self   Support Systems Children;Parent   Patient's Healthcare Decision Maker is: Legal Next of Kin   PCP Verified by CM Yes   Last Visit to PCP Within last 3 months   Prior Functional Level Independent in ADLs/IADLs   Current Functional Level Independent in ADLs/IADLs   Can patient return to prior living arrangement Yes   Ability to make needs known: Good   Family able to assist with home care needs: Yes   Would you like for me to discuss the discharge plan with any other family members/significant others, and if so, who? Yes  (father)   Administrator, arts None   Social/Functional History   Lives With Parent;Son;Family   Type of Home House   Home Layout One level   Bathroom Equipment None   Home Equipment None   Prior Level of Assist for ADLs Independent   Prior Level of Assist for [employer] Independent   Ambulation Assistance Independent   Prior Level of Assist for [employer] fast streamer Yes   Discharge Planning   Type of Residence House   Living Arrangements Parent;Family Members;Children   Current Services Prior To Admission None   Patient [facility] to be discharged to: [facility] At/After Discharge   Services At/After Discharge None   Mode of Transport at Discharge Self   Confirm Follow Up Transport Self     CM met with the patient at the bedside to complete discharge planning assessment. Patient confirmed demographics as correct. She lives in a one story house with her father, sister, and son. She denies using any DME or assistive devices. She reports being independent, performs her own ADL's. Patient drives herself.     Patient denies any needs at this time at discharge.  Will discharge home self-care once medically stable.    Advance Care Planning     General Advance Care Planning (ACP) Conversation    Date of  Conversation: 05/12/2024  Conducted with: Patient with Decision Making Capacity  Other persons present: None    Healthcare Decision Maker:   Primary Decision Maker: Donley,[name] - Parent - [phone]       Belen Bowers

## 2024-05-12 NOTE — Discharge Summary (Signed)
 Physician Discharge Summary       Patient: Yolanda Weber MRN: [id number]     Date of Birth: 10/03/1971  Age: 53 y.o.  Sex: female    PCP: Sueanne Emerald, MD    Allergies: Shellfish allergy and Ibuprofen    Admit date: 05/09/2024  Admitting Provider: Lindley Rhein, MD    Discharge date: 05/12/2024  Discharging Provider: Lindley Rhein, MD    * Admission Diagnoses:   Diverticulitis [K57.92]  Diverticulitis of colon [K57.32]      * Discharge Diagnoses:    Active Hospital Problems    Diverticulitis           Chief Complaint   Patient presents with    Chest Pain        Presentation on 05/09/2024  HPI as per admitting provider on 05/09/2024    Yolanda Weber is a 53 y.o. female followed by No primary care provider on file. and  has a past medical history of Angiomyolipoma of both kidneys, Bulging lumbar disc, Diverticulosis, Fibromyalgia, Sciatica, and Sickle cell trait.    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  Last night prior to starting work she was having left arm and chest pain and stayed for short period time but she was still able to go to work.  It is noted that of 1 month back she had nuclear stress test performed that was found to be negative.  For the past 3 days she has been having multiple episodes of diarrhea especially when she eats any food but abdominal pain started this morning mainly in the epigastric and left upper quadrant and she has history of heartburn and recently appears to have been started on a PPI.  On evaluation blood pressure 144/91 heart rate of 95 O2 sat of 100% on room air she had a mild leukocytosis of 15 and BMP only abnormality noted was a potassium of 3.1.  Initial troponin was 4  CT of the abdomen pelvis showed moderate colonic diverticulosis with an area of subtotal pericolonic fat stranding around the splenic flexure with suspicion for mild or early diverticulitis.  She also has numerous chronic  angiomyolipomas of bilateral kidney and had followed with urology in the past but not recently.  She initially was given a 1 L normal saline bolus 40 mEq oral potassium and IV Protonix and Zofran 4 mg.  She was given Flagyl dose as well as Levaquin and Toradol 15 mg IV x 1 she continued to have pain so Dilaudid 0.5 mg IV x 1 was given and was then referred for admission for acute diverticulitis      Discussed management with the ED provider Dr. Austin Leff, MD and agree with the plan for hospitalization     [facility] Course:     05/12/2024: Patient continues to use IV Dilaudid dosing but pain on palpation has improved still had diarrhea/loose stool with p.o. intake.  Discussed with patient about CT if showing no further evidence of diverticulitis.  Patient does have fibromyalgia and will need referral from PCP for pain management new patient appointment in [place].  Hard to determine if IV Dilaudid dosing she was requesting because of diverticular pain or her chronic fibromyalgia pain.    With CT showing negative findings patient can be discharged home will place on Augmentin 875/125 twice daily for 7 days to cover possibility of diverticulitis.  Also regimen of Protonix twice daily and Carafate 1 g  4 times daily for 10 days for possibility of initial symptoms being from gastritis as patient is on chronic meloxicam dosing.  For diarrhea enteric bacteria culture was negative so placed on cholestyramine twice daily with meals and as needed Imodium also started lactobacillus daily basis.  Gave work note for patient hospitalization and to return to work after initial evaluation by PCP scheduled for the 26th     05/11/2024: slept well overnight, no nausea or vomiting, still with left upper quadrant pain with mild guarding on palpation. Had one episode of diarrhea overnight. Dilauded already two doses this AM and 4 doses total on 5/17     05/10/24: seen on follow up with nurse, resting in bed. States that she didn't get  much rest. Still required dilaudid dosing on 5/16 PM and again early this morning x 2 doses.  Diarrhea has improved with none overnight.  Pain more located in the left upper quadrant.  No nausea or vomit afebrile    Acute diverticulitis  -As noted on CT showing moderate diverticulosis with area of stranding  -Noted leukocytosis of 15  -Flagyl and Levaquin given in the emergency room will continue Zosyn 4.5 g every 6 hours  -Pain medications with IV Dilaudid every 4 hours as needed  -Toradol 30 mg IV every 6 hours x 4 doses and discontinued on 5/17  -Clear liquid diet - > full liquid   -Stool studies for enteric bacteria still pending  -IV fluids discontinued on 5/17  - IV Protonix 40 twice daily and Mylanta prior to meals for possible gastritis//GERD symptoms  -5/17: Continues to have pain more in the left upper quadrant with epigastric pain improved.  IV Toradol.  No effectiveness and since patient takes Tylenol as needed for her fibromyalgia will do 1 g scheduled Tylenol 3 times daily continue as needed Dilaudid.  And advance diet to full liquid, initial leukocytosis resolved  - 5/18: still having LUQ pain and needed 4 dose of dilauded on 5/17 and already two doses this AM. No nausea or vomiting. One episode off loose stool. Obtain CT abdomen / pelvis to reevaluate since still having pain. Add flagyl IV q8hrs.   Consult GI when available if symptoms not resolving and or worsening CT   -5/19: CT abdomen pelvis does not show any further evidence of diverticulitis possibly her symptoms continuing may be secondary to underlying gastritis duodenitis as patient is on chronic meloxicam dosing.  Attempted to get patient first available follow-up with Dr. Hercules Lombard which unfortunately is in September.  Increased Protonix to 40 twice daily and added Carafate 1 g 4 times daily prior to meals and at bedtime.  Continues to have loose stools soon after taking p.o. intake and enteric bacteria checked in the stool was negative.   She has not had any nausea or vomiting.  Will send patient on cholestyramine 1 g oral twice daily with meals as well as as needed Imodium and daily lactobacillus x 10days        Hypokalemia  - Mildly low at 3.1 and 40 mEq KCl x 1 given the emergency room  - Will supplement with 20 mEq KCl and IV fluids  - K: 3.1 -> 3.6 -> 3.4 -> 3.5  - repeat on 5/18 at 3.4 and supplement orally 20meq kcl bid   - follow daily bmp      Tobacco use  - Smokes about 5 to 6 cigarettes/day and wishes for nicotine patch  - Start 14 mg patch and change  daily and will not discharge with as patient continues to smoke      Fibromyalgia  - States she only uses Tylenol but placed on IV Toradol and as needed Dilaudid for diverticulitis pain  - Monitor closely  - Restarted home Klonopin 1 mg changed to every 12 hours as needed and will change to nightly as patient still unable to sleep well overnight  -Started trazodone 25 mg oral nightly  - 5/18: still asking for dilauded for left upper quadrant pain   - Continue to request IV pain medication even though CT of the abdomen pelvis was negative and possibility that she is having her fibromyalgia type pain restarted her meloxicam dosing attempted to get patient follow-up as new patient with pain management but patient will need to get referral from her PCP Dr. Grey Leaver.     * Procedures:   * No surgery found *        Consults:   NONE      Vitals Last 24 Hours:  Patient Vitals for the past 24 hrs:   Temp Pulse Resp BP SpO2   05/12/24 1006 -- -- 17 -- --   05/12/24 0733 97.7 F (36.5 C) 77 20 (!) 141/98 99 %   05/12/24 0348 -- -- 17 -- --   05/12/24 0327 97.5 F (36.4 C) 76 17 (!) 138/100 97 %   05/12/24 0318 -- -- 18 -- --   05/11/24 2332 97.3 F (36.3 C) 60 18 109/68 96 %   05/11/24 2010 -- -- 17 -- --   05/11/24 1943 -- 91 -- (!) 152/108 95 %   05/11/24 1940 -- -- 18 -- --   05/11/24 1932 97.9 F (36.6 C) 97 18 (!) 174/120 96 %   05/11/24 1543 98.4 F (36.9 C) 81 16 130/83 100 %   05/11/24 1447  -- -- 16 -- --   05/11/24 1417 -- -- 18 -- --        Discharge Exam:  General: Alert, cooperative, no distress, appears stated age.  Head:  Normocephalic, without obvious abnormality, atraumatic.  Eyes:  Conjunctivae/corneas clear. Pupils equal, round, reactive to light. Extraocular movements intact.  Lungs:  Clear to auscultation bilaterally.no wheeze, rales, crackles, rhonchi   Chest wall: No tenderness or deformity.  Heart:  Regular rate and rhythm, S1, S2 normal, no murmur, click, rub or gallop.  Abdomen:  Soft, non-tender. Bowel sounds normal. No masses,  No organomegaly.  Extremities: Extremities normal, atraumatic, no cyanosis or edema.  Pulses: 2+ and symmetric all extremities.  Skin: Skin color, texture, turgor normal. No rashes or lesions  Neurologic: Awake, Alert, oriented. No obvious gross sensory or motor deficits    Labs:    Recent Results (from the past 24 hours)   CBC with Auto Differential    Collection Time: 05/12/24  5:41 AM   Result Value Ref Range    WBC 9.0 3.6 - 11.0 K/uL    RBC 4.48 3.80 - 5.20 M/uL    Hemoglobin 13.0 11.5 - 16.0 g/dL    Hematocrit 16.1 09.6 - 47.0 %    MCV 81.7 80.0 - 99.0 FL    MCH 29.0 26.0 - 34.0 PG    MCHC 35.5 30.0 - 36.5 g/dL    RDW 04.5 40.9 - 81.1 %    Platelets 410 (H) 150 - 400 K/uL    MPV 9.0 8.9 - 12.9 FL    Nucleated RBCs 0.0 0.0 PER 100 WBC    nRBC 0.00  0.00 - 0.01 K/uL    Neutrophils % 44.2 32.0 - 75.0 %    Lymphocytes % 42.0 12.0 - 49.0 %    Monocytes % 8.4 5.0 - 13.0 %    Eosinophils % 4.9 0.0 - 7.0 %    Basophils % 0.3 0.0 - 1.0 %    Immature Granulocytes % 0.2 0 - 0.5 %    Neutrophils Absolute 3.99 1.80 - 8.00 K/UL    Lymphocytes Absolute 3.80 (H) 0.80 - 3.50 K/UL    Monocytes Absolute 0.76 0.00 - 1.00 K/UL    Eosinophils Absolute 0.44 (H) 0.00 - 0.40 K/UL    Basophils Absolute 0.03 0.00 - 0.10 K/UL    Immature Granulocytes Absolute 0.02 0.00 - 0.04 K/UL    Differential Type AUTOMATED     Comprehensive Metabolic Panel    Collection Time: 05/12/24  5:41 AM    Result Value Ref Range    Sodium 139 136 - 145 mmol/L    Potassium 3.5 3.5 - 5.1 mmol/L    Chloride 103 97 - 108 mmol/L    CO2 28 21 - 32 mmol/L    Anion Gap 8 2 - 12 mmol/L    Glucose 96 65 - 100 mg/dL    BUN 4 (L) 6 - 20 mg/dL    Creatinine 0.45 4.09 - 1.02 mg/dL    BUN/Creatinine Ratio 7 (L) 12 - 20      Est, Glom Filt Rate >90 >60 ml/min/1.60m2    Calcium 9.0 8.5 - 10.1 mg/dL    Total Bilirubin 0.5 0.2 - 1.0 mg/dL    AST 22 15 - 37 U/L    ALT 40 12 - 78 U/L    Alk Phosphatase 87 45 - 117 U/L    Total Protein 6.7 6.4 - 8.2 g/dL    Albumin 3.1 (L) 3.5 - 5.0 g/dL    Globulin 3.6 2.0 - 4.0 g/dL    Albumin/Globulin Ratio 0.9 (L) 1.1 - 2.2     Magnesium    Collection Time: 05/12/24  5:41 AM   Result Value Ref Range    Magnesium 1.9 1.6 - 2.4 mg/dL     .result      Imaging:  CT ABDOMEN PELVIS WO CONTRAST Additional Contrast? None  Result Date: 05/11/2024  Multiple fat-containing renal lesions bilaterally consistent with angiomyolipomas. No renal stones or hydronephrosis. No acute intra-abdominal inflammation to explain left upper quadrant pain. Electronically signed by Alver Jobs    CT ABDOMEN PELVIS WO CONTRAST Additional Contrast? None  Result Date: 05/09/2024  1. Moderate colonic diverticulosis, with an area of subtle pericolonic fat stranding around the splenic flecture, suspicious for very mild or early diverticulitis. 2. Numerous bilateral renal masses, most of which are confidently identified as angiomyolipomas. However, there are some small lesions which are not fully characterized. Recommend renal protocol abdominal MRI for more complete evaluation, or prior studies if available would be helpful to establish stability. Presence of multiple angiomyolipomas raises the suspicion for underlying systemic conditions including tuberous sclerosis, neurofibromatosis, and von Hippel-Lindau syndrome. 3. 3.9 cm exophytic uterine fibroid. Electronically signed by Valorie Gearing               * Discharge Condition: Good  *  Disposition: Home w/Family      Discharge Medications:     Medication List        START taking these medications      amoxicillin-clavulanate 875-125 MG per tablet  Commonly known as: AUGMENTIN  Take 1  tablet by mouth 2 times daily for 7 days     cholestyramine light 4 g packet  Take 1 packet by mouth 2 times daily (before meals) for 10 days     lactobacillus capsule  Take 1 capsule by mouth daily (with breakfast)  Start taking on: May 13, 2024     loperamide 2 MG tablet  Commonly known as: IMODIUM A-D  Take 1 tablet by mouth 3 times daily as needed for Diarrhea     sucralfate 1 GM tablet  Commonly known as: Carafate  Take 1 tablet by mouth 4 times daily (before meals and nightly) for 10 days            CHANGE how you take these medications      pantoprazole 40 MG tablet  Commonly known as: PROTONIX  Take 1 tablet by mouth 2 times daily (with meals)  What changed: when to take this            CONTINUE taking these medications      clonazePAM 1 MG tablet  Commonly known as: KLONOPIN     meloxicam 15 MG tablet  Commonly known as: MOBIC            STOP taking these medications      acetaminophen 325 MG tablet  Commonly known as: TYLENOL               Where to Get Your Medications        These medications were sent to [id number] - [place], [place] - [address] - P [phone] Kaylene Pascal [phone]  [address] [place], [place] [address]      Phone: [phone]   amoxicillin-clavulanate 875-125 MG per tablet  cholestyramine light 4 g packet  lactobacillus capsule  loperamide 2 MG tablet  pantoprazole 40 MG tablet  sucralfate 1 GM tablet           * Follow-up Care/Patient Instructions:  Activity: activity as tolerated  Diet:  Soft bland small meals      Sueanne Emerald, MD  [address]  [place] [address]  [phone]    Follow up on 05/19/2024  Please follow up with your PCP Dr. Grey Leaver on Monday May 26th at 1:00pm.    Anthony Bateman., MD  [address]  [place]  [place] [address]  [phone]    Follow up on 08/28/2024  Please follow up with Dr. Hercules Lombard on Sep. 4th at 3:00pm    follow up with diverticulitis hospital stay, possible underlying PUD / GERD    Alejandro Hurt, MD  [address]  [place] [address]  [phone]    Follow up  new patient appt for patient with chronic fibromyalgia  need referral from pcp      This is dictation was done by dragon, computer voice recognition software. Quite often unanticipated grammatical, syntax, homophones and other interpretive errors or inadvertently transcribed by the computer software. Please excuse errors that have escaped final proofreading. Thank you.       Spent 40 minutes evaluting and coordinating patient care and discharging home    Signed:  Ashvin Adelson R Atanacio Melnyk, MD  05/12/2024

## 2024-05-12 NOTE — Progress Notes (Signed)
 Physician Progress Note      [name], Yolanda Weber  CSN #:                  [id number]  DOB:                       1971-02-04  ADMIT DATE:       05/09/2024 8:49 AM  DISCH DATE:        05/12/2024 11:48 AM  RESPONDING  PROVIDER #:        Lindley Rhein MD          QUERY TEXT:    The attending physician is required to clarify conflicting documentation in   the medical record.  Noted documentation of Acute Diverticulitis (DS 5/19) and   Gastritis (DS 5/19).    The clinical indicators include:  -"Upper abdominal pain and tenderness with leukocytosis CT showing   diverticulitis will give initial dose of antibiotics fluids and pain   medication potassium supplementation." (ED 5/16)    -"She initially was given a 1 L normal saline bolus 40 mEq oral potassium and   IV Protonix  and Zofran  4 mg.  She was given Flagyl  dose as well as Levaquin    and Toradol  15 mg IV x 1 she continued to have pain so Dilaudid  0.5 mg IV x 1   was given and was then referred for admission for acute diverticulitis" (H&p   5/16)    -"Acute diverticulitis. CT showing moderate diverticulosis with area of   stranding. Noted leukocytosis of 15"(DS 5/19)    -"5/19: CT abdomen pelvis does not show any further evidence of diverticulitis   possibly her symptoms continuing may be secondary to underlying gastritis   duodenitis as patient is on chronic meloxicam  dosing" (DS 5/19)  Options provided:  -- Acute Diverticulitis confirmed and Gastritis ruled out  -- Gastritis confirmed and?Acute Diverticulitis ruled out  -- Other - I will add my own diagnosis  -- Disagree - Not applicable / Not valid  -- Disagree - Clinically unable to determine / Unknown  -- Refer to Clinical Documentation Reviewer    PROVIDER RESPONSE TEXT:    After study, Acute Diverticulitis confirmed, and Gastritis ruled out    Query created by: Renne Casa on 05/19/2024 2:53 PM      Electronically signed by:  Lindley Rhein MD 05/21/2024 2:09 PM

## 2024-05-12 NOTE — Discharge Instructions (Addendum)
 NOTIFY YOUR PHYSICIAN FOR ANY OF THE FOLLOWING:   Fever over 101 degrees for 24 hours.   Chest pain, shortness of breath, fever, chills, nausea, vomiting, diarrhea, change in mentation, falling, weakness, bleeding. Severe pain or pain not relieved by medications, as well as any other signs or symptoms that you may have questions about    On follow up with PCP need to request referral to Pain management doctor  Will give work note from time admitted here until follow up with pcp but can return to work once you feel ready no restrictions     Advance diet slow to bland soft diet.   Symptoms you are having also can be from Gastritis / GERD from chronic meloxicam medication taken daily. Attempt to stay away from ibuprofen and other NSAIDs    Carafate ordered to help give coating to stomach and assist with pain and follow up with pcp to see if giving improvement and to continue medication with new prescription   Protonix increase to twice daily and if improvement in symptoms to continue until PCP says to change back to once daily   Cholestyramine helps to decrease loose diarrhea episodes and initially may still have secondary to antibiotic being given.  But hold medication if noting constipation has no bowel movements and 48-hours  Can use as needed Imodium for any increased frequency of diarrhea  Added lactobacillus to also assist with loose stools      Survey  It was a pleasure to care for you here at [facility] and I am glad you are feeling better to get home.  When you get home you should receive a survey either via email or in postal mail and appreciate if you are able to fill it out and send it back at your convenience.   Here at [facility] we always strive to give "excellent" care and hope you are able to express that in the survey    Thank you   Dr Emelda Hane MD

## 2024-05-12 NOTE — Plan of Care (Signed)
 Problem: Pain  Goal: Verbalizes/displays adequate comfort level or baseline comfort level  Outcome: Progressing  Flowsheets (Taken 05/11/2024 0731 by Velvin, Vicky Lynn, RN)  Verbalizes/displays adequate comfort level or baseline comfort level:   Encourage patient to monitor pain and request assistance   Assess pain using appropriate pain scale   Administer analgesics based on type and severity of pain and evaluate response     Problem: Gastrointestinal - Adult  Goal: Minimal or absence of nausea and vomiting  Outcome: Progressing  Flowsheets (Taken 05/12/2024 0339)  Minimal or absence of nausea and vomiting:   Administer IV fluids as ordered to ensure adequate hydration   Administer ordered antiemetic medications as needed   Advance diet as tolerated, if ordered   Provide nonpharmacologic comfort measures as appropriate  Goal: Maintains or returns to baseline bowel function  Outcome: Progressing  Flowsheets (Taken 05/12/2024 0339)  Maintains or returns to baseline bowel function:   Assess bowel function   Encourage oral fluids to ensure adequate hydration   Administer ordered medications as needed   Encourage mobilization and activity  Goal: Maintains adequate nutritional intake  Outcome: Progressing  Flowsheets (Taken 05/12/2024 0339)  Maintains adequate nutritional intake:   Monitor percentage of each meal consumed   Identify factors contributing to decreased intake, treat as appropriate   Assist with meals as needed   Monitor intake and output, weight and lab values

## 2024-07-13 ENCOUNTER — Emergency Department: Admit: 2024-07-14 | Payer: Medicaid (Managed Care) | Primary: Internal Medicine

## 2024-07-13 DIAGNOSIS — K5732 Diverticulitis of large intestine without perforation or abscess without bleeding: Principal | ICD-10-CM

## 2024-07-13 NOTE — ED Notes (Signed)
 [facility] EMERGENCY DEPT  EMERGENCY DEPARTMENT HISTORY AND PHYSICAL EXAM      Date of evaluation: 07/13/2024  Patient Name: Yolanda Weber [birth year]  MRN: [id number]  ED Provider: Lamar ORN. Annabella Elford, MD   Note Started: 10:22 PM EDT 07/13/24    HISTORY OF PRESENT ILLNESS     Chief Complaint   Patient presents with    Abdominal Cramping    Diarrhea    Vomiting       History Provided By: Patient     HPI: Yolanda Weber is a 53 y.o. female presenting with complaint of left lower quadrant abdominal pain, vomiting and diarrhea that started yesterday.  She has history of hospitalization for acute diverticulitis.    PAST MEDICAL HISTORY   Past Medical History:  Past Medical History:   Diagnosis Date    Angiomyolipoma of both kidneys     Bulging lumbar disc     Diverticulosis     Fibromyalgia     Sciatica     Sickle cell trait        Past Surgical History:  Past Surgical History:   Procedure Laterality Date    CHOLECYSTECTOMY      CYST REMOVAL Right     Breast    DILATION AND CURETTAGE OF UTERUS         Family History:  History reviewed. No pertinent family history.    Social History:  Social History     Tobacco Use    Smoking status: Some Days     Passive exposure: Never    Smokeless tobacco: Never   Vaping Use    Vaping status: Never Used   Substance Use Topics    Alcohol use: Not Currently    Drug use: Never       Allergies:  Allergies   Allergen Reactions    Shellfish Allergy Hives    Ibuprofen Nausea And Vomiting       PCP: [name], MD    Current Meds:   Current Facility-Administered Medications   Medication Dose Route Frequency Provider Last Rate Last Admin    morphine  sulfate (PF) injection 4 mg  4 mg IntraVENous NOW Amyjo Mizrachi, Lamar ORN, MD        ondansetron  (ZOFRAN ) injection 4 mg  4 mg IntraVENous Once Jibran Crookshanks, Lamar ORN, MD         Current Outpatient Medications   Medication Sig Dispense Refill    cholestyramine  light 4 g packet Take 1 packet by mouth 2 times daily (before meals) for 10 days 20 packet 0     pantoprazole  (PROTONIX ) 40 MG tablet Take 1 tablet by mouth 2 times daily (with meals) 30 tablet 3    sucralfate  (CARAFATE ) 1 GM tablet Take 1 tablet by mouth 4 times daily (before meals and nightly) for 10 days 40 tablet 0    lactobacillus (CULTURELLE) capsule Take 1 capsule by mouth daily (with breakfast) 30 capsule 0    meloxicam  (MOBIC ) 15 MG tablet Take 1 tablet by mouth daily      clonazePAM  (KLONOPIN ) 1 MG tablet Take 1 tablet by mouth 2 times daily.         Social Determinants of Health:   Social Drivers of Health     Tobacco Use: High Risk (07/13/2024)    Patient History     Smoking Tobacco Use: Some Days     Smokeless Tobacco Use: Never     Passive Exposure: Never   Alcohol Use: Not At Risk (07/13/2024)  AUDIT-C     Frequency of Alcohol Consumption: Never     Average Number of Drinks: Patient does not drink     Frequency of Binge Drinking: Never   Financial Resource Strain: Not on file   Food Insecurity: No Food Insecurity (05/09/2024)    Hunger Vital Sign     Worried About Running Out of Food in the Last Year: Never true     Ran Out of Food in the Last Year: Never true   Transportation Needs: No Transportation Needs (05/09/2024)    PRAPARE - Therapist, art (Medical): No     Lack of Transportation (Non-Medical): No   Physical Activity: Not on file   Stress: Not on file   Social Connections: Unknown (06/12/2023)    Received from [employer]     How often do you feel lonely or isolated from those around you? (Adult - for ages 31 years and over): Not on file   Intimate Partner Violence: Not on file   Depression: Not at risk (03/30/2020)    Received from [facility]    PHQ-2     PHQ-9 Total Score: 0   Housing Stability: High Risk (05/09/2024)    Housing Stability Vital Sign     Unable to Pay for Housing in the Last Year: No     Number of Times Moved in the Last Year: 2     Homeless in the Last Year: No   Interpersonal Safety: Not At Risk (07/13/2024)    Interpersonal  Safety Domain Source: IP Abuse Screening     Physical abuse: Denies     Verbal abuse: Denies     Emotional abuse: Denies     Financial abuse: Denies     Sexual abuse: Denies   Utilities: Not At Risk (05/09/2024)    [facility] Utilities     Threatened with loss of utilities: No     Review of Systems   Constitutional: Negative.    HENT: Negative.     Respiratory: Negative.     Cardiovascular: Negative.    Gastrointestinal:  Positive for abdominal pain, diarrhea and vomiting.   Genitourinary: Negative.    Musculoskeletal: Negative.    Neurological: Negative.    Hematological: Negative.        PHYSICAL EXAM   Physical Exam  Vitals and nursing note reviewed.   Constitutional:       General: She is in acute distress.      Appearance: She is not ill-appearing.   HENT:      Head: Normocephalic and atraumatic.   Eyes:      General: No scleral icterus.     Extraocular Movements: Extraocular movements intact.      Conjunctiva/sclera: Conjunctivae normal.      Pupils: Pupils are equal, round, and reactive to light.   Cardiovascular:      Rate and Rhythm: Normal rate and regular rhythm.      Pulses: Normal pulses.      Heart sounds: Normal heart sounds.   Pulmonary:      Effort: Pulmonary effort is normal.      Breath sounds: Normal breath sounds.   Abdominal:      Palpations: Abdomen is soft.      Tenderness: There is abdominal tenderness.   Skin:     General: Skin is warm.      Capillary Refill: Capillary refill takes less than 2 seconds.   Neurological:  General: No focal deficit present.      Mental Status: She is alert and oriented to person, place, and time.         SCREENINGS                No data recorded       LAB, EKG AND DIAGNOSTIC RESULTS   Labs:  No results found for this or any previous visit (from the past 12 hours).    EKG:.Not Applicable     Radiologic Studies:  Radiographic images are visualized and preliminarily interpreted by the ED Provider with the following findings: Not Applicable..     Interpretation per  the Radiologist below, if available at the time of this note:  No orders to display        Records Reviewed: I reviewed old record.  There was a hospital admission for diverticulitis.    MEDICAL DECISION MAKING and ED COURSE   10:22 PM Differential and Considerations of tests not ordered: Consider colitis, diverticulitis.  CBC, CMP, UA, CT abdomen was ordered.     Vitals:    Vitals:    07/13/24 2055   BP: (!) 155/102   Pulse: 82   Temp: 98.1 F (36.7 C)   TempSrc: Oral   SpO2: 95%   Weight: 81.5 kg (179 lb 11.2 oz)       ED COURSE   CT shows uncomplicated sigmoid diverticulitis.  Patient was given Zosyn .  Because of history of vomiting, patient warrants admission because she is at risk of outpatient failure.    Clinical Management Tools:      Smoking Cessation: Not Applicable    Patient was given the following medications:  Medications   morphine  sulfate (PF) injection 4 mg (has no administration in time range)   ondansetron  (ZOFRAN ) injection 4 mg (has no administration in time range)       CONSULTS: See ED Course/MDM for further details.  None   PROCEDURES   Unless otherwise noted above, none  Procedures    SEPSIS REASSESSMENT & CRITICAL CARE TIME   SEPSIS REASSESSMENT: Patient does NOT meet Sepsis criteria after ED workup    Patient does not meet Critical Care Time, 0 minutes  CLINICAL IMPRESSIONS   No diagnosis found.   SDOH/DISPOSITION/PLAN   Social Determinants affecting Treatment Plan: None    DISPOSITION   Patient is admitted to [facility] on the hospitalist service.             PATIENT REFERRED TO:  No follow-up provider specified.      DISCHARGE MEDICATIONS:     Medication List        ASK your doctor about these medications      cholestyramine  light 4 g packet  Take 1 packet by mouth 2 times daily (before meals) for 10 days     clonazePAM  1 MG tablet  Commonly known as: KLONOPIN      lactobacillus capsule  Take 1 capsule by mouth daily (with breakfast)     meloxicam  15 MG tablet  Commonly known as: MOBIC       pantoprazole  40 MG tablet  Commonly known as: PROTONIX   Take 1 tablet by mouth 2 times daily (with meals)     sucralfate  1 GM tablet  Commonly known as: Carafate   Take 1 tablet by mouth 4 times daily (before meals and nightly) for 10 days                DISCONTINUED MEDICATIONS:  Current Discharge Medication  List          I am the Primary Clinician of Record. Lamar ORN. Ashlyne Olenick, MD (electronically signed)    (Please note that parts of this dictation were completed with voice recognition software. Quite often unanticipated grammatical, syntax, homophones, and other interpretive errors are inadvertently transcribed by the computer software. Please disregards these errors. Please excuse any errors that have escaped final proofreading.)       Gudrun Axe, Lamar ORN, MD  07/14/24 [phone]

## 2024-07-14 ENCOUNTER — Inpatient Hospital Stay
Admission: EM | Admit: 2024-07-14 | Discharge: 2024-07-16 | Disposition: A | Discharge status: Home / Self Care | Payer: Medicaid (Managed Care)

## 2024-07-14 LAB — C-REACTIVE PROTEIN: CRP: <0.29 mg/dL (ref 0.00–0.30)

## 2024-07-14 LAB — CBC WITH AUTO DIFFERENTIAL
Basophils %: 0.4 % (ref 0.0–1.0)
Basophils %: 0.3 % (ref 0.0–1.0)
Basophils Absolute: 0.07 K/UL (ref 0.00–0.10)
Basophils Absolute: 0.04 K/UL (ref 0.00–0.10)
Eosinophils %: 2.4 % (ref 0.0–7.0)
Eosinophils %: 3.5 % (ref 0.0–7.0)
Eosinophils Absolute: 0.4 K/UL (ref 0.00–0.40)
Eosinophils Absolute: 0.48 K/UL — ABNORMAL HIGH (ref 0.00–0.40)
Hematocrit: 40.2 % (ref 35.0–47.0)
Hematocrit: 34.1 % — ABNORMAL LOW (ref 35.0–47.0)
Hemoglobin: 14.1 g/dL (ref 11.5–16.0)
Hemoglobin: 12 g/dL (ref 11.5–16.0)
Immature Granulocytes %: 0.4 % (ref 0–0.5)
Immature Granulocytes %: 0.4 % (ref 0–0.5)
Immature Granulocytes Absolute: 0.07 K/UL — ABNORMAL HIGH (ref 0.00–0.04)
Immature Granulocytes Absolute: 0.05 K/UL — ABNORMAL HIGH (ref 0.00–0.04)
Lymphocytes %: 36.3 % (ref 12.0–49.0)
Lymphocytes %: 38.3 % (ref 12.0–49.0)
Lymphocytes Absolute: 6.04 K/UL — ABNORMAL HIGH (ref 0.80–3.50)
Lymphocytes Absolute: 5.32 K/UL — ABNORMAL HIGH (ref 0.80–3.50)
MCH: 28.9 pg (ref 26.0–34.0)
MCH: 29 pg (ref 26.0–34.0)
MCHC: 35.1 g/dL (ref 30.0–36.5)
MCHC: 35.2 g/dL (ref 30.0–36.5)
MCV: 82.4 FL (ref 80.0–99.0)
MCV: 82.4 FL (ref 80.0–99.0)
MPV: 9.4 FL (ref 8.9–12.9)
MPV: 9 FL (ref 8.9–12.9)
Monocytes %: 6.5 % (ref 5.0–13.0)
Monocytes %: 6.8 % (ref 5.0–13.0)
Monocytes Absolute: 1.08 K/UL — ABNORMAL HIGH (ref 0.00–1.00)
Monocytes Absolute: 0.95 K/UL (ref 0.00–1.00)
Neutrophils %: 54 % (ref 32.0–75.0)
Neutrophils %: 50.7 % (ref 32.0–75.0)
Neutrophils Absolute: 9.01 K/UL — ABNORMAL HIGH (ref 1.80–8.00)
Neutrophils Absolute: 7.06 K/UL (ref 1.80–8.00)
Nucleated RBCs: 0 /100{WBCs}
Nucleated RBCs: 0 /100{WBCs}
Platelets: 425 K/uL — ABNORMAL HIGH (ref 150–400)
Platelets: 402 K/uL — ABNORMAL HIGH (ref 150–400)
RBC: 4.88 M/uL (ref 3.80–5.20)
RBC: 4.14 M/uL (ref 3.80–5.20)
RDW: 14.4 % (ref 11.5–14.5)
RDW: 14.3 % (ref 11.5–14.5)
WBC: 16.7 K/uL — ABNORMAL HIGH (ref 3.6–11.0)
WBC: 13.9 K/uL — ABNORMAL HIGH (ref 3.6–11.0)
nRBC: 0 K/uL (ref 0.00–0.01)
nRBC: 0 K/uL (ref 0.00–0.01)

## 2024-07-14 LAB — COMPREHENSIVE METABOLIC PANEL
ALT: 40 U/L (ref 12–78)
AST: 21 U/L (ref 15–37)
Albumin/Globulin Ratio: 0.8 — ABNORMAL LOW (ref 1.1–2.2)
Albumin: 3.4 g/dL — ABNORMAL LOW (ref 3.5–5.0)
Alk Phosphatase: 101 U/L (ref 45–117)
Anion Gap: 10 mmol/L (ref 2–12)
BUN/Creatinine Ratio: 23 — ABNORMAL HIGH (ref 12–20)
BUN: 13 mg/dL (ref 6–20)
CO2: 25 mmol/L (ref 21–32)
Calcium: 9.2 mg/dL (ref 8.5–10.1)
Chloride: 108 mmol/L (ref 97–108)
Creatinine: 0.56 mg/dL (ref 0.55–1.02)
Est, Glom Filt Rate: >90 ml/min/1.73m2 (ref >60)
Globulin: 4.3 g/dL — ABNORMAL HIGH (ref 2.0–4.0)
Glucose: 98 mg/dL (ref 65–100)
Potassium: 2.7 mmol/L — CL (ref 3.5–5.1)
Sodium: 143 mmol/L (ref 136–145)
Total Bilirubin: 0.8 mg/dL (ref 0.2–1.0)
Total Protein: 7.7 g/dL (ref 6.4–8.2)

## 2024-07-14 LAB — LIPASE: Lipase: 19 U/L (ref 13–75)

## 2024-07-14 LAB — URINALYSIS WITH REFLEX TO CULTURE
Bilirubin Confirmation, UA: POSITIVE — AB
Bilirubin, Urine: POSITIVE — AB
Glucose, Ur: NEGATIVE mg/dL
Ketones, Urine: 40 mg/dL — AB
Nitrite, Urine: NEGATIVE
Specific Gravity, UA: 1.025 (ref 1.003–1.030)
Urobilinogen, Urine: 0.2 EU/dL (ref 0.2–1.0)
pH, Urine: 6 (ref 5.0–8.0)

## 2024-07-14 LAB — BASIC METABOLIC PANEL
Anion Gap: 11 mmol/L (ref 2–12)
BUN/Creatinine Ratio: 13 (ref 12–20)
BUN: 9 mg/dL (ref 6–20)
CO2: 26 mmol/L (ref 21–32)
Calcium: 8.4 mg/dL — ABNORMAL LOW (ref 8.5–10.1)
Chloride: 110 mmol/L — ABNORMAL HIGH (ref 97–108)
Creatinine: 0.68 mg/dL (ref 0.55–1.02)
Est, Glom Filt Rate: >90 ml/min/1.73m2 (ref >60)
Glucose: 102 mg/dL — ABNORMAL HIGH (ref 65–100)
Potassium: 3.1 mmol/L — ABNORMAL LOW (ref 3.5–5.1)
Sodium: 147 mmol/L — ABNORMAL HIGH (ref 136–145)

## 2024-07-14 LAB — MAGNESIUM: Magnesium: 1.7 mg/dL (ref 1.6–2.4)

## 2024-07-14 MED ORDER — ACETAMINOPHEN 650 MG RE SUPP
650 | Freq: Four times a day (QID) | RECTAL | Status: DC | PRN
Start: 2024-07-14 — End: 2024-07-16

## 2024-07-14 MED ORDER — SODIUM CHLORIDE 0.9 % IV SOLN
0.9 | INTRAVENOUS | Status: DC
Start: 2024-07-14 — End: 2024-07-14

## 2024-07-14 MED ORDER — MORPHINE SULFATE (PF) 4 MG/ML IJ SOLN
4 | INTRAMUSCULAR | Status: AC
Start: 2024-07-14 — End: 2024-07-13
  Administered 2024-07-14: 03:00:00 4 mg via INTRAVENOUS

## 2024-07-14 MED ORDER — POTASSIUM CHLORIDE IN NACL 20-0.9 MEQ/L-% IV SOLN
20-0.9 | INTRAVENOUS | Status: DC
Start: 2024-07-14 — End: 2024-07-16
  Administered 2024-07-14 – 2024-07-16 (×6): via INTRAVENOUS

## 2024-07-14 MED ORDER — MAGNESIUM SULFATE 2000 MG/50 ML IVPB PREMIX
2 | INTRAVENOUS | Status: DC | PRN
Start: 2024-07-14 — End: 2024-07-16

## 2024-07-14 MED ORDER — POTASSIUM CHLORIDE 10 MEQ/100ML IV SOLN
10 | INTRAVENOUS | Status: DC | PRN
Start: 2024-07-14 — End: 2024-07-16

## 2024-07-14 MED ORDER — KETOROLAC TROMETHAMINE 30 MG/ML IJ SOLN
30 | Freq: Four times a day (QID) | INTRAMUSCULAR | Status: DC
Start: 2024-07-14 — End: 2024-07-14
  Administered 2024-07-14: 13:00:00 30 mg via INTRAVENOUS

## 2024-07-14 MED ORDER — POTASSIUM CHLORIDE CRYS ER 20 MEQ PO TBCR
20 | ORAL | Status: DC | PRN
Start: 2024-07-14 — End: 2024-07-16
  Administered 2024-07-14 – 2024-07-16 (×2): 40 meq via ORAL

## 2024-07-14 MED ORDER — ACETAMINOPHEN 325 MG PO TABS
325 | Freq: Four times a day (QID) | ORAL | Status: DC | PRN
Start: 2024-07-14 — End: 2024-07-16

## 2024-07-14 MED ORDER — TRAZODONE HCL 50 MG PO TABS
50 | Freq: Every evening | ORAL | Status: DC
Start: 2024-07-14 — End: 2024-07-16
  Administered 2024-07-15: 02:00:00 25 mg via ORAL

## 2024-07-14 MED ORDER — POTASSIUM CHLORIDE 10 MEQ/100ML IV SOLN
10 | Freq: Once | INTRAVENOUS | Status: AC
Start: 2024-07-14 — End: 2024-07-14
  Administered 2024-07-14: 07:00:00 10 meq via INTRAVENOUS

## 2024-07-14 MED ORDER — ONDANSETRON 4 MG PO TBDP
4 | Freq: Three times a day (TID) | ORAL | Status: DC | PRN
Start: 2024-07-14 — End: 2024-07-16

## 2024-07-14 MED ORDER — MORPHINE SULFATE (PF) 2 MG/ML IV SOLN
2 | INTRAVENOUS | Status: DC | PRN
Start: 2024-07-14 — End: 2024-07-16
  Administered 2024-07-14 – 2024-07-16 (×7): 1 mg via INTRAVENOUS

## 2024-07-14 MED ORDER — IOPAMIDOL 76 % IV SOLN
76 | Freq: Once | INTRAVENOUS | Status: AC | PRN
Start: 2024-07-14 — End: 2024-07-13
  Administered 2024-07-14: 04:00:00 100 mL via INTRAVENOUS

## 2024-07-14 MED ORDER — CLONAZEPAM 0.5 MG PO TABS
0.5 | Freq: Two times a day (BID) | ORAL | Status: DC
Start: 2024-07-14 — End: 2024-07-14
  Administered 2024-07-14: 09:00:00 1 mg via ORAL

## 2024-07-14 MED ORDER — CLONAZEPAM 0.5 MG PO TABS
0.5 | Freq: Two times a day (BID) | ORAL | Status: DC | PRN
Start: 2024-07-14 — End: 2024-07-16
  Administered 2024-07-15 – 2024-07-16 (×2): 1 mg via ORAL

## 2024-07-14 MED ORDER — PIPERACILLIN SOD-TAZOBACTAM SO 4.5 (4-0.5) G IV SOLR
4.5 | INTRAVENOUS | Status: AC
Start: 2024-07-14 — End: 2024-07-14
  Administered 2024-07-14: 06:00:00 4500 mg via INTRAVENOUS

## 2024-07-14 MED ORDER — ONDANSETRON HCL 4 MG/2ML IJ SOLN
4 | Freq: Four times a day (QID) | INTRAMUSCULAR | Status: DC | PRN
Start: 2024-07-14 — End: 2024-07-16

## 2024-07-14 MED ORDER — PANTOPRAZOLE SODIUM 40 MG PO TBEC
40 | Freq: Two times a day (BID) | ORAL | Status: DC
Start: 2024-07-14 — End: 2024-07-16
  Administered 2024-07-14 – 2024-07-16 (×5): 40 mg via ORAL

## 2024-07-14 MED ORDER — POTASSIUM BICARB-CITRIC ACID 20 MEQ PO TBEF
20 | ORAL | Status: DC | PRN
Start: 2024-07-14 — End: 2024-07-16

## 2024-07-14 MED ORDER — ONDANSETRON HCL 4 MG/2ML IJ SOLN
4 | Freq: Once | INTRAMUSCULAR | Status: AC
Start: 2024-07-14 — End: 2024-07-13
  Administered 2024-07-14: 03:00:00 4 mg via INTRAVENOUS

## 2024-07-14 MED ORDER — POLYETHYLENE GLYCOL 3350 17 G PO PACK
17 | Freq: Every day | ORAL | Status: DC | PRN
Start: 2024-07-14 — End: 2024-07-16

## 2024-07-14 MED ORDER — NORMAL SALINE FLUSH 0.9 % IV SOLN
0.9 | INTRAVENOUS | Status: DC | PRN
Start: 2024-07-14 — End: 2024-07-16

## 2024-07-14 MED ORDER — NICOTINE 14 MG/24HR TD PT24
14 | Freq: Every day | TRANSDERMAL | Status: DC
Start: 2024-07-14 — End: 2024-07-16
  Administered 2024-07-14 – 2024-07-16 (×3): 1 via TRANSDERMAL

## 2024-07-14 MED ORDER — SODIUM CHLORIDE 0.9 % IV SOLN
0.9 | INTRAVENOUS | Status: DC | PRN
Start: 2024-07-14 — End: 2024-07-16

## 2024-07-14 MED ORDER — CLONAZEPAM 0.5 MG PO TABS
0.5 | Freq: Two times a day (BID) | ORAL | Status: DC
Start: 2024-07-14 — End: 2024-07-14

## 2024-07-14 MED ORDER — SODIUM CHLORIDE 0.9 % IV BOLUS
0.9 | Freq: Once | INTRAVENOUS | Status: AC
Start: 2024-07-14 — End: 2024-07-14
  Administered 2024-07-14: 06:00:00 1000 mL via INTRAVENOUS

## 2024-07-14 MED ORDER — ALUM & MAG HYDROXIDE-SIMETH 200-200-20 MG/5ML PO SUSP
200-200-20 | Freq: Four times a day (QID) | ORAL | Status: DC | PRN
Start: 2024-07-14 — End: 2024-07-16

## 2024-07-14 MED ORDER — PIPERACILLIN SOD-TAZOBACTAM SO 3.375 (3-0.375) G IV SOLR
3.375 | Freq: Three times a day (TID) | INTRAVENOUS | Status: DC
Start: 2024-07-14 — End: 2024-07-16
  Administered 2024-07-14 – 2024-07-16 (×6): 3375 mg via INTRAVENOUS

## 2024-07-14 MED ORDER — NORMAL SALINE FLUSH 0.9 % IV SOLN
0.9 | Freq: Two times a day (BID) | INTRAVENOUS | Status: DC
Start: 2024-07-14 — End: 2024-07-16
  Administered 2024-07-14 – 2024-07-15 (×3): 10 mL via INTRAVENOUS

## 2024-07-14 MED FILL — POTASSIUM CHLORIDE IN NACL 20-0.9 MEQ/L-% IV SOLN: 20-0.9 MEQ/L-% | INTRAVENOUS | Qty: 1000 | Fill #0

## 2024-07-14 MED FILL — PIPERACILLIN SOD-TAZOBACTAM SO 3.375 (3-0.375) G IV SOLR: 3.375 (3-0.375) g | INTRAVENOUS | Qty: 3375 | Fill #0

## 2024-07-14 MED FILL — MORPHINE SULFATE 4 MG/ML IJ SOLN: 4 mg/mL | INTRAMUSCULAR | Qty: 1 | Fill #0

## 2024-07-14 MED FILL — PANTOPRAZOLE SODIUM 40 MG PO TBEC: 40 mg | ORAL | Qty: 1 | Fill #0

## 2024-07-14 MED FILL — MORPHINE SULFATE 2 MG/ML IJ SOLN: 2 mg/mL | INTRAMUSCULAR | Qty: 1 | Fill #0

## 2024-07-14 MED FILL — PIPERACILLIN SOD-TAZOBACTAM SO 4.5 (4-0.5) G IV SOLR: 4.5 (4-0.5) g | INTRAVENOUS | Qty: 4500 | Fill #0

## 2024-07-14 MED FILL — ONDANSETRON HCL 4 MG/2ML IJ SOLN: 4 MG/2ML | INTRAMUSCULAR | Qty: 2 | Fill #0

## 2024-07-14 MED FILL — NORMAL SALINE FLUSH 0.9 % IV SOLN: 0.9 % | INTRAVENOUS | Qty: 40 | Fill #0

## 2024-07-14 MED FILL — CLONAZEPAM 0.5 MG PO TABS: 0.5 mg | ORAL | Qty: 2 | Fill #0

## 2024-07-14 MED FILL — NICOTINE STEP 2 14 MG/24HR TD PT24: 14 mg/(24.h) | TRANSDERMAL | Qty: 1 | Fill #0

## 2024-07-14 MED FILL — ISOVUE-370 76 % IV SOLN: 76 % | INTRAVENOUS | Qty: 100 | Fill #0

## 2024-07-14 MED FILL — KETOROLAC TROMETHAMINE 30 MG/ML IJ SOLN: 30 mg/mL | INTRAMUSCULAR | Qty: 1 | Fill #0

## 2024-07-14 MED FILL — SODIUM CHLORIDE 0.9 % IV SOLN: 0.9 % | INTRAVENOUS | Qty: 1000 | Fill #0

## 2024-07-14 MED FILL — POTASSIUM CHLORIDE 10 MEQ/100ML IV SOLN: 10 MEQ/0ML | INTRAVENOUS | Qty: 100 | Fill #0

## 2024-07-14 MED FILL — KLOR-CON M20 20 MEQ PO TBCR: 20 meq | ORAL | Qty: 2 | Fill #0

## 2024-07-14 NOTE — Progress Notes (Signed)
 LPN assessment reviewed.

## 2024-07-14 NOTE — Progress Notes (Signed)
 Morphine  1 mg iv given for c/o abdominal pain 7/10.

## 2024-07-14 NOTE — ED Notes (Signed)
 Report called to Sam, pt transported upstairs with no complications

## 2024-07-14 NOTE — Progress Notes (Signed)
 Bedside shift change report given to Baeleigh Devincent(oncoming nurse) by sheppard monte lpn (offgoing nurse). Report included the following information Nurse Handoff Report.

## 2024-07-14 NOTE — Progress Notes (Signed)
 Pt. Resting quietly in bed ivf patent infusing .

## 2024-07-14 NOTE — Progress Notes (Signed)
 Stool sent to lab

## 2024-07-14 NOTE — Plan of Care (Signed)
 Problem: Safety - Adult  Goal: Free from fall injury  07/14/2024 0833 by Delores Gabriel BIRCH, LPN  Outcome: Progressing  07/14/2024 0359 by Perri Lukes, LPN  Outcome: Progressing  07/14/2024 0359 by Perri Lukes, LPN  Outcome: Progressing

## 2024-07-14 NOTE — Progress Notes (Signed)
 Comprehensive Nutrition Assessment    Type and Reason for Visit:       Nutrition Recommendations/Plan:   Continue NPO  As medically appropriate, advance to GI bland  Continue GI meds  Monitor/document BM, GI s/s, diet advancement PO% in I/O     Malnutrition Assessment:  Malnutrition Status:  At risk for malnutrition (N/V pta, currently NPO) (07/14/24 1208)    Context:  Acute Illness     Findings of the 6 clinical characteristics of malnutrition:  Energy Intake:  Mild decrease in energy intake  Weight Loss:  7.5% over 3 months     Body Fat Loss:  Unable to assess     Muscle Mass Loss:  Unable to assess    Fluid Accumulation:  No fluid accumulation    Grip Strength:  Not Performed    Nutrition Assessment:    Obs for acute diverticulitis. Assessed for MST 2. Pt currently NPO since admit. C/o LL quad abd pain w/ N/V x3days pta. Hx of same in May. Per EMR, pt w/ 14# (7.3%)wt loss x47mo- significant. CBW in chart does not have method used listed. Will continue to monitor NPO status. Labs: Na 147, K 3.1, Cl 110, BG 102, Ca 8.4. Meds: toradol , protonix , zosyn , KCl IV, NS+KCL@125ml /hr, morphine , KCl      Nutrition Related Findings:    NFPE deferred d/t working remotely. No c/s difficulty noted. +N/V on admit, No D/C noted. Last BM pta. No edema. Wound Type: None       Current Nutrition Intake & Therapies:    Average Meal Intake: NPO  Average Supplements Intake: NPO  Diet NPO Exceptions are: Sips of Water with Meds, Ice Chips    Anthropometric Measures:  Height: 157.5 cm (5' 2.01)  Ideal Body Weight (IBW): 110 lbs (50 kg)       Current Body Weight: 81.2 kg (179 lb 0.2 oz), 162.7 % IBW. Weight Source: Not specified  Current BMI (kg/m2): 32.7  Usual Body Weight: 87.6 kg (193 lb 2 oz)     % Weight Change (Calculated): -7.3  Weight Adjustment For: No Adjustment                 BMI Categories: Obese Class 1 (BMI 30.0-34.9)    Estimated Daily Nutrient Needs:  Energy Requirements Based On: Formula  Weight Used for Energy  Requirements: Current  Energy (kcal/day): 1776-1912kcals/day (MSJ x1.3AF x1.0-1.1SF)  Weight Used for Protein Requirements: Current  Protein (g/day): 65-81g/day (0.8-1.0g/kg CBW)  Method Used for Fluid Requirements: 1 ml/kcal  Fluid (ml/day): 1776-1937ml/day (57ml/kcal)    Nutrition Diagnosis:   Inadequate oral intake related to altered GI function, nausea/vomiting/diarrhea as evidenced by NPO or clear liquid status due to medical condition    Nutrition Interventions:   Food and/or Nutrient Delivery: Continue NPO  Nutrition Education/Counseling: Education/Counseling needed  Coordination of Nutrition Care: Continue to monitor while inpatient, Interdisciplinary Rounds       Goals:  Goals: Initiate PO diet, within 2 days  Type of Goal: New goal       Nutrition Monitoring and Evaluation:   Behavioral-Environmental Outcomes: None Identified  Food/Nutrient Intake Outcomes: Diet Advancement/Tolerance  Physical Signs/Symptoms Outcomes: Biochemical Data, Nausea or Vomiting, Weight    Discharge Planning:    Too soon to determine     Yolanda Weber  Contact: ext [phone] or via [employer]

## 2024-07-14 NOTE — Plan of Care (Signed)
 Problem: Safety - Adult  Goal: Free from fall injury  07/14/2024 0359 by Perri Lukes, LPN  Outcome: Progressing  07/14/2024 0359 by Perri Lukes, LPN  Outcome: Progressing

## 2024-07-14 NOTE — H&P (Signed)
 History and Physical  Tele-Hospitalist, Tele-Medicine encounter      Patient:  Yolanda Weber  Date of Birth: 29-May-1971  MR Number: [id number]    CHIEF COMPLAINT:    Abdominal pain    HISTORY OF PRESENT ILLNESS:   The patient is a 53 y.o. female presented to emergency department with abdominal pain that started 3 days ago.  It is in the left lower quadrant associated with nausea vomiting and diarrhea.  She denies any fever or chills.  No chest pain shortness of breath.  She came to the ER and found to have acute diverticulitis and admitted to the hospital.  She did have diverticulitis in May 2025.  She never had any abdominal surgery or colonoscopy done in the past.    Past Medical History:      Diagnosis Date    Angiomyolipoma of both kidneys     Bulging lumbar disc     Diverticulosis     Fibromyalgia     Sciatica     Sickle cell trait        Past Surgical History:      Procedure Laterality Date    CHOLECYSTECTOMY      CYST REMOVAL Right     Breast    DILATION AND CURETTAGE OF UTERUS         Medications Prior to Admission:    Prior to Admission medications    Medication Sig Start Date End Date Taking? Authorizing Provider   cholestyramine  light 4 g packet Take 1 packet by mouth 2 times daily (before meals) for 10 days 05/12/24 05/22/24  Mandadi, Goutham R, MD   pantoprazole  (PROTONIX ) 40 MG tablet Take 1 tablet by mouth 2 times daily (with meals) 05/12/24 07/11/24  Mandadi, Goutham R, MD   sucralfate  (CARAFATE ) 1 GM tablet Take 1 tablet by mouth 4 times daily (before meals and nightly) for 10 days 05/12/24 05/22/24  Mandadi, Goutham R, MD   lactobacillus (CULTURELLE) capsule Take 1 capsule by mouth daily (with breakfast) 05/13/24   Avi Alverda SAUNDERS, MD   meloxicam  (MOBIC ) 15 MG tablet Take 1 tablet by mouth daily 04/29/24   [provider]   clonazePAM  (KLONOPIN ) 1 MG tablet Take 1 tablet by mouth 2 times daily.    Automatic Reconciliation, Ar       Allergies:  Shellfish allergy and Ibuprofen    Social History:    TOBACCO:   reports that she has been smoking. She has never been exposed to tobacco smoke. She has never used smokeless tobacco.  ETOH:   reports that she does not currently use alcohol.      Family History:   History reviewed. No pertinent family history.    Review of systems  General: denies chills,  fever,  weakness,   Eyes: denies change in vision, eye irritation,   Ear, nose, throat: denies change in voice, sinus problems, or  difficulty swallowing.  Cardiovascular: denies chest pain, swelling of hands/feet, palpitations.  Respiratory: denies cough, shortness of breath.  GI: As in HPI   musculoskeletal: denies muscle pain, cramping, joint pain, stiffness, swelling   Neurological: denies seizures, tingling, numbness, tremors, paralysis, trouble with balance.  Skin: denies rash, itching, healing problems,  Endocrine: denies excessive thirst,   Psychiatric: denies anxiety, depression,     Vitals:    07/14/24 0141   BP:    Pulse:    Resp:    Temp: 98.9 F (37.2 C)   SpO2:      vital  signs reviewed in electronic chart    Physical exam    General Appearance: Alert, No acute distress.   HEENT: Head is normocephalic, atraumatic. Extra ocular movements intact.   EYES: No scleral icterus. No conjunctival pallor.  Neck: Normal inspection. Supple  Respiratory: Equal breath sounds bilaterally. Clear to auscultation. Respiratory effort normal. No crepts or wheezing.  Cardiovascular: Normal heart sounds. Regular S1-S1, no murmur.  Abdominal: Abdomen is soft. No Epigastric tenderness. Normoactive BS   Extremities: Pulses equal in all extremities. No LE edema.  Neurological: Alert and oriented X3- no focal neurological deficits.  Musculoskeletal: normal ROM, no joint swelling or tenderness.  Skin: Warm, dry, intact. No rashes   Psychiatric; appropriate mood and affect.    Patient was evaluated through a synchronous (real-time) audio-video encounter using  tele-medicine cart. This virtual visit was conducted with patient's  consent.  Nurse was present during the encounter.        Lab Results   Component Value Date    NA 143 07/13/2024    K 2.7 (LL) 07/13/2024    CL 108 07/13/2024    CO2 25 07/13/2024    BUN 13 07/13/2024    CREATININE 0.56 07/13/2024    GLUCOSE 98 07/13/2024    CALCIUM 9.2 07/13/2024    BILITOT 0.8 07/13/2024    ALKPHOS 101 07/13/2024    AST 21 07/13/2024    ALT 40 07/13/2024    LABGLOM >90 07/13/2024    GFRAA >60 01/01/2020    AGRATIO 0.8 (L) 01/01/2020    GLOB 4.3 (H) 07/13/2024           Lab Results   Component Value Date    WBC 16.7 (H) 07/13/2024    HGB 14.1 07/13/2024    HCT 40.2 07/13/2024    MCV 82.4 07/13/2024    PLT 425 (H) 07/13/2024     @LASTTROPONINS @    Xray Result (most recent):  XR CHEST (2 VW) 03/07/2024    Narrative  INDICATION:  Chest pain.    TECHNIQUE:  Frontal and lateral chest.    COMPARISON:  07/29/2021 XR Ribs with Chest    FINDINGS:  The cardiomediastinal silhouette is normal in size.  There is no consolidation or  atelectasis in either lung.  There are no pleural effusions.  There is no  pneumothorax.  No acute osseous process.  Surgical clips are present in the right  upper abdominal quadrant.    Impression  1.  No acute lung or pleural pathology.  2.  Surgical clips right upper abdominal quadrant.  3.  No change when compared to the frontal radiograph from 07/29/2021.          Electronically Signed by Aurora Griffins, DO 03/07/2024 7:31 PM EDT      CT Result (most recent):  CT ABDOMEN PELVIS W IV CONTRAST 07/13/2024    Narrative  EXAM: CT ABDOMEN PELVIS W IV CONTRAST    INDICATION: abd pain, vomiting hx of diverticulitis    COMPARISON: 05/11/2024    CONTRAST: 100 mL of Isovue -370.    ORAL CONTRAST: None    TECHNIQUE:  Following intravenous administration of contrast, thin axial images were  obtained through the abdomen and pelvis. Coronal and sagittal reconstructions  were generated. CT dose reduction was achieved through use of a standardized  protocol tailored for this examination and  automatic exposure control for dose  modulation.    FINDINGS:  LOWER THORAX: No significant abnormality in the incidentally imaged lower chest.  LIVER: Probable 3.3  cm hemangioma in segment 8 is not significantly changed.  Probable smaller hemangiomas in segments 7 and 5.  BILIARY TREE: Gallbladder surgically absent. CBD is stable in caliber.  SPLEEN: within normal limits.  PANCREAS: No mass or ductal dilatation.  ADRENALS: Unremarkable.  KIDNEYS: No calculus or hydronephrosis. Numerous small bilateral fat-containing  renal masses are not significantly changed.  STOMACH: Unremarkable.  SMALL BOWEL: No dilatation or wall thickening.  COLON: Focal wall thickening and mild pericolonic inflammatory changes in the  sigmoid colon. Scattered colonic diverticula. No associated fluid collection.  APPENDIX: Normal.  PERITONEUM: No ascites or pneumoperitoneum.  RETROPERITONEUM: No lymphadenopathy or aortic aneurysm.  REPRODUCTIVE ORGANS: Unchanged 3.8 cm subserosal fibroid along the uterine  fundus.  URINARY BLADDER: Decompressed. No calculus.  BONES: No destructive bone lesion.  ABDOMINAL WALL: No mass or hernia.  ADDITIONAL COMMENTS: N/A    Impression  Acute sigmoid colon diverticulitis, with no evidence of abscess or perforation;  clinical follow-up is recommended to assure complete resolution. No significant  change in numerous small bilateral renal angiomyolipomas. 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 Unchanged uterine fibroid.    Electronically signed by Oneil Collier        Labs personally reviewed: CBC, CMP,   Ct abdomen reviewed.    Case discussed with ED provider regarding clinical presentation, labs/investigations and possible admission.  Previous medical records reviewed.      Medical Decision Making (MDM) and Assessment and Plan       Acute sigmoid colon diverticulitis, with no evidence of abscess or perforation. This is her 2nd episode   Hypokalemia  Leukocytosis  history of Angiomyolipoma of both kidneys  lumbar disc  disease  Fibromyalgia   Sickle cell trait  Tobacco dependence    PLAN:  Keep under observation status  Conservative treatment for acute diverticulitis.  Bowel rest.  IV fluids.  IV Zosyn .  Pain control.  This is her second episode of acute diverticulitis.  She needs to have colonoscopy as an outpatient after 4 to 6 weeks.  Counseling to quit smoking.  Will have her nicotine  patch    DVT Prophylaxis: Lovenox  subcu  Code status: Full code    History obtained from Patient.  Plan of care d/w patient.     This is telemedicine encounter. Total Time to see patient, doing H&P and Tele-medicine encounter: 55 minutes.    Patient Location: In Hospital in [place]   MD Location : Remote office in [place]     Discussion/MDM: Patient with multiple medical comorbidities, each with high likelihood for morbidity and mortality if left untreated.  I have reviewed patient's presenting subjective and objective findings, as well as all laboratory studies, imaging studies, and vital signs to date as well as treatment rendered and patient's response to those treatments.  In addition, prior medical, surgical and relevant social and family histories were reviewed.    Electronically signed by Truitt Lash, MD on 07/14/2024 at 2:09 AM

## 2024-07-14 NOTE — Progress Notes (Signed)
 Hospitalist Progress Note          Dr. Yanel Dombrosky MD      Date:07/14/2024       Room:202/01  Patient Name:Yolanda Weber     Date of Birth:05-01-1971     Age:53 y.o.      Chief Complaint   Patient presents with    Abdominal Cramping    Diarrhea    Vomiting         HPI as per admitting provider on 07/13/2024  The patient is a 53 y.o. female presented to emergency department with abdominal pain that started 3 days ago.  It is in the left lower quadrant associated with nausea vomiting and diarrhea.  She denies any fever or chills.  No chest pain shortness of breath.  She came to the ER and found to have acute diverticulitis and admitted to the hospital.  She did have diverticulitis in May 2025.  She never had any abdominal surgery or colonoscopy done in the past.       Subjective      07/14/2024 seen on follow-up only 1 episode of loose stools since admission no vomiting continues to have abdominal pain attempted IV Toradol  but stated that it caused stomach to be  queasy continue IV fluids IV antibiotics      Review of Systems   Constitutional: Negative.    HENT: Negative.     Eyes: Negative.    Respiratory: Negative.     Cardiovascular: Negative.    Gastrointestinal:  Positive for abdominal pain and diarrhea.   Endocrine: Negative.    Genitourinary: Negative.    Musculoskeletal: Negative.    Skin: Negative.    Allergic/Immunologic: Negative.    Neurological: Negative.    Hematological: Negative.    Psychiatric/Behavioral: Negative.         Objective           Vitals Last 24 Hours:  Patient Vitals for the past 24 hrs:   Temp Pulse Resp BP SpO2   07/14/24 0704 97.7 F (36.5 C) 83 16 106/62 100 %   07/14/24 0504 -- 57 18 136/80 100 %   07/14/24 0254 98.2 F (36.8 C) 67 18 (!) 142/105 100 %   07/14/24 0229 -- 55 18 136/76 97 %   07/14/24 0141 98.9 F (37.2 C) -- -- -- --   07/14/24 0130 -- 55 15 -- 96 %   07/13/24 2351 -- 56 18 -- 97 %   07/13/24 2321 -- -- -- 120/86 96 %   07/13/24 2055 98.1 F (36.7 C)  82 -- (!) 155/102 95 %        I/O (24Hr):    Intake/Output Summary (Last 24 hours) at 07/14/2024 0802  Last data filed at 07/14/2024 0232  Gross per 24 hour   Intake 100.28 ml   Output --   Net 100.28 ml       Physical Exam:  General: Alert, cooperative, no distress, appears stated age.  Head:  Normocephalic, without obvious abnormality, atraumatic.  Eyes:  Conjunctivae/corneas clear. Pupils equal, round, reactive to light. Extraocular movements intact.  Lungs:  Clear to auscultation bilaterally.no wheeze, rales, crackles, rhonchi   Chest wall: No tenderness or deformity.  Heart:  Regular rate and rhythm, S1, S2 normal, no murmur, click, rub or gallop.  Abdomen:  Soft, left lower quadrant tenderness bowel sounds normal. No masses,  No organomegaly.  Extremities: Extremities normal, atraumatic, no cyanosis or edema.  Pulses: 2+ and symmetric all extremities.  Skin: Skin color, texture, turgor normal. No rashes or lesions  Neurologic: Awake, Alert, oriented. No obvious gross sensory or motor deficits      Active Medications           Current Facility-Administered Medications   Medication Dose Route Frequency    pantoprazole  (PROTONIX ) tablet 40 mg  40 mg Oral BID WC    sodium chloride  flush 0.9 % injection 5-40 mL  5-40 mL IntraVENous 2 times per day    sodium chloride  flush 0.9 % injection 5-40 mL  5-40 mL IntraVENous PRN    0.9 % sodium chloride  infusion   IntraVENous PRN    potassium chloride  (KLOR-CON  M) extended release tablet 40 mEq  40 mEq Oral PRN    Or    potassium bicarb-citric acid  (EFFER-K) effervescent tablet 40 mEq  40 mEq Oral PRN    Or    potassium chloride  10 mEq/100 mL IVPB (Peripheral Line)  10 mEq IntraVENous PRN    magnesium  sulfate 2000 mg in 50 mL IVPB premix  2,000 mg IntraVENous PRN    ondansetron  (ZOFRAN -ODT) disintegrating tablet 4 mg  4 mg Oral Q8H PRN    Or    ondansetron  (ZOFRAN ) injection 4 mg  4 mg IntraVENous Q6H PRN    polyethylene glycol (GLYCOLAX ) packet 17 g  17 g Oral Daily PRN     acetaminophen  (TYLENOL ) tablet 650 mg  650 mg Oral Q6H PRN    Or    acetaminophen  (TYLENOL ) suppository 650 mg  650 mg Rectal Q6H PRN    piperacillin -tazobactam (ZOSYN ) 3,375 mg in sodium chloride  0.9 % 50 mL IVPB (addEASE)  3,375 mg IntraVENous Q8H    0.9% NaCl with KCl 20 mEq infusion   IntraVENous Continuous    nicotine  (NICODERM CQ ) 14 MG/24HR 1 patch  1 patch TransDERmal Daily    morphine  (PF) injection 1 mg  1 mg IntraVENous Q4H PRN    clonazePAM  (KLONOPIN ) tablet 1 mg  1 mg Oral BID         Allergies         Shellfish allergy and Ibuprofen       Labs/Imaging/Diagnostics      Labs:  Recent Results (from the past 48 hours)   CBC with Auto Differential    Collection Time: 07/13/24 10:31 PM   Result Value Ref Range    WBC 16.7 (H) 3.6 - 11.0 K/uL    RBC 4.88 3.80 - 5.20 M/uL    Hemoglobin 14.1 11.5 - 16.0 g/dL    Hematocrit 59.7 64.9 - 47.0 %    MCV 82.4 80.0 - 99.0 FL    MCH 28.9 26.0 - 34.0 PG    MCHC 35.1 30.0 - 36.5 g/dL    RDW 85.5 88.4 - 85.4 %    Platelets 425 (H) 150 - 400 K/uL    MPV 9.4 8.9 - 12.9 FL    Nucleated RBCs 0.0 0.0 PER 100 WBC    nRBC 0.00 0.00 - 0.01 K/uL    Neutrophils % 54.0 32.0 - 75.0 %    Lymphocytes % 36.3 12.0 - 49.0 %    Monocytes % 6.5 5.0 - 13.0 %    Immature Granulocytes % 0.4 0 - 0.5 %    Neutrophils Absolute 9.01 (H) 1.80 - 8.00 K/UL    Lymphocytes Absolute 6.04 (H) 0.80 - 3.50 K/UL    Monocytes Absolute 1.08 (H) 0.00 - 1.00 K/UL    Immature Granulocytes Absolute 0.07 (H) 0.00 - 0.04 K/UL  Eosinophils % 2.4 0.0 - 7.0 %    Basophils % 0.4 0.0 - 1.0 %    Eosinophils Absolute 0.40 0.00 - 0.40 K/UL    Basophils Absolute 0.07 0.00 - 0.10 K/UL    Differential Type Smear Scanned      RBC Comment Anisocytosis  1+       Comprehensive Metabolic Panel    Collection Time: 07/13/24 10:31 PM   Result Value Ref Range    Sodium 143 136 - 145 mmol/L    Potassium 2.7 (LL) 3.5 - 5.1 mmol/L    Chloride 108 97 - 108 mmol/L    CO2 25 21 - 32 mmol/L    Anion Gap 10 2 - 12 mmol/L    Glucose 98 65 -  100 mg/dL    BUN 13 6 - 20 mg/dL    Creatinine 9.43 9.44 - 1.02 mg/dL    BUN/Creatinine Ratio 23 (H) 12 - 20      Est, Glom Filt Rate >90 >60 ml/min/1.55m2    Calcium 9.2 8.5 - 10.1 mg/dL    Total Bilirubin 0.8 0.2 - 1.0 mg/dL    AST 21 15 - 37 U/L    ALT 40 12 - 78 U/L    Alk Phosphatase 101 45 - 117 U/L    Total Protein 7.7 6.4 - 8.2 g/dL    Albumin 3.4 (L) 3.5 - 5.0 g/dL    Globulin 4.3 (H) 2.0 - 4.0 g/dL    Albumin/Globulin Ratio 0.8 (L) 1.1 - 2.2     Lipase    Collection Time: 07/13/24 10:31 PM   Result Value Ref Range    Lipase 19 13 - 75 U/L   Urinalysis with Reflex to Culture    Collection Time: 07/13/24 11:25 PM    Specimen: Urine   Result Value Ref Range    Color, UA Yellow/Straw      Appearance Clear Clear      Specific Gravity, UA 1.025 1.003 - 1.030      pH, Urine 6.0 5.0 - 8.0      Protein, UA Trace (A) Negative mg/dL    Glucose, Ur Negative Negative mg/dL    Ketones, Urine 40 (A) Negative mg/dL    Bilirubin, Urine Positive (A) Negative      Blood, Urine Large (A) Negative      Urobilinogen, Urine 0.2 0.2 - 1.0 EU/dL    Nitrite, Urine Negative Negative      Leukocyte Esterase, Urine Trace (A) Negative      Bilirubin Confirmation, UA Positive (A) Negative      WBC, UA 5-10 0 - 5 /hpf    RBC, UA 0-5 0 - 3 /hpf    BACTERIA, URINE 2+ (A) Negative /hpf    Urine Culture if Indicated Culture not indicated by UA result Culture not indicated by UA result          Imaging:  CT ABDOMEN PELVIS W IV CONTRAST Additional Contrast? None  Result Date: 07/14/2024  Acute sigmoid colon diverticulitis, with no evidence of abscess or perforation; clinical follow-up is recommended to assure complete resolution. 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 Unchanged uterine fibroid. Electronically signed by Oneil Collier       Assessment//Plan           Problem List:  Hospital Problems           Last Modified POA    * (Principal) Acute diverticulitis 07/14/2024 Yes  Acute Sigmoid Diverticulitis   - Presents with left lower quadrant pain started 3 days ago with associated nausea and vomiting and diarrhea with similar episode in May 2025  - CT positive for acute sigmoid colon diverticulitis but no evidence of abscess or perforation  -4 mg of IV morphine  given in the emergency room x 1 and continue morphine  1 mg every 4 hours as needed for severe pain  -Noted leukocytosis  -Continue current IV fluids  -Zosyn  3.375 every 8 hours  -Stool for enteric bacteria    Hypokalemia   - 2.7 on admission was given 10 mEq KCl IV rider x 1 and 20 mEq KCl and current IV fluids  -Follow repeat labs    Leukocytosis   - 16.7 on admission secondary to diverticulitis and also possible UTI  -Follow-up procalcitonin level  -Continue Zosyn  every 8 hours  -Monitor daily CBC    UTI  - UA on 7/20 shows trace leukocytes and 2+ bacteria  - follow UC  - continue zosyn    - bladder checks     Tobacco dependence   -Noted to have smoke about 5 to 6 cigarettes a day  - Encourage cessation given nicotine  patch 14 mg change daily    Fibromyalgia   - Chronic noted to be using only Tylenol   -Every 12 hours as needed and trazodone  25 mg nightly restart home Klonopin  1 mg- Restart home pantoprazole  40 mg twice daily    DVT prophylaxis   - Lovenox      Code Status : Full Code           Discussion/MDM: Patient with multiple medical comorbidities, each with high likelihood for morbidity and mortality if left untreated.   I have reviewed patient's presenting subjective and objective findings, as well as all laboratory studies, imaging studies, and vital signs to date as well as treatment rendered and patient's response to those treatments.  In addition, prior medical, surgical and relevant social and family histories were reviewed. I have discussed management plan with patient/family and with nursing staff.      Toxic drug monitoring:    This is dictation was done by dragon, Advertising account planner. Quite often  unanticipated grammatical, syntax, homophones and other interpretive errors or inadvertently transcribed by the computer software. Please excuse errors that have escaped final proofreading. Thank you.       Spent 50 minutes evaluting and coordinating patients care      Electronically signed by Karam Dunson R Marelly Wehrman, MD on 07/14/2024 at 8:02 AM

## 2024-07-15 LAB — CBC WITH AUTO DIFFERENTIAL
Basophils %: 0.3 % (ref 0.0–1.0)
Basophils Absolute: 0.04 K/UL (ref 0.00–0.10)
Eosinophils %: 3.9 % (ref 0.0–7.0)
Eosinophils Absolute: 0.51 K/UL — ABNORMAL HIGH (ref 0.00–0.40)
Hematocrit: 34.4 % — ABNORMAL LOW (ref 35.0–47.0)
Hemoglobin: 11.8 g/dL (ref 11.5–16.0)
Immature Granulocytes %: 0.2 % (ref 0–0.5)
Immature Granulocytes Absolute: 0.03 K/UL (ref 0.00–0.04)
Lymphocytes %: 38.1 % (ref 12.0–49.0)
Lymphocytes Absolute: 4.98 K/UL — ABNORMAL HIGH (ref 0.80–3.50)
MCH: 28.6 pg (ref 26.0–34.0)
MCHC: 34.3 g/dL (ref 30.0–36.5)
MCV: 83.3 FL (ref 80.0–99.0)
MPV: 11.8 FL (ref 8.9–12.9)
Monocytes %: 6.5 % (ref 5.0–13.0)
Monocytes Absolute: 0.85 K/UL (ref 0.00–1.00)
Neutrophils %: 51 % (ref 32.0–75.0)
Neutrophils Absolute: 6.67 K/UL (ref 1.80–8.00)
Nucleated RBCs: 0 /100{WBCs}
Platelets: 269 K/uL (ref 150–400)
RBC: 4.13 M/uL (ref 3.80–5.20)
RDW: 14.3 % (ref 11.5–14.5)
WBC: 13.1 K/uL — ABNORMAL HIGH (ref 3.6–11.0)
nRBC: 0 K/uL (ref 0.00–0.01)

## 2024-07-15 LAB — BASIC METABOLIC PANEL
Anion Gap: 13 mmol/L — ABNORMAL HIGH (ref 2–12)
BUN/Creatinine Ratio: 8 — ABNORMAL LOW (ref 12–20)
BUN: 5 mg/dL — ABNORMAL LOW (ref 6–20)
CO2: 22 mmol/L (ref 21–32)
Calcium: 8.6 mg/dL (ref 8.5–10.1)
Chloride: 109 mmol/L — ABNORMAL HIGH (ref 97–108)
Creatinine: 0.61 mg/dL (ref 0.55–1.02)
Est, Glom Filt Rate: >90 ml/min/1.73m2 (ref >60)
Glucose: 67 mg/dL (ref 65–100)
Potassium: 3.4 mmol/L — ABNORMAL LOW (ref 3.5–5.1)
Sodium: 144 mmol/L (ref 136–145)

## 2024-07-15 LAB — PROCALCITONIN: Procalcitonin: <0.05 ng/mL — ABNORMAL HIGH

## 2024-07-15 LAB — MAGNESIUM: Magnesium: 1.5 mg/dL — ABNORMAL LOW (ref 1.6–2.4)

## 2024-07-15 MED ORDER — MAGNESIUM SULFATE 2000 MG/50 ML IVPB PREMIX
2 | Freq: Once | INTRAVENOUS | Status: AC
Start: 2024-07-15 — End: 2024-07-15
  Administered 2024-07-15: 17:00:00 2000 mg via INTRAVENOUS

## 2024-07-15 MED ORDER — OXYCODONE HCL 5 MG PO TABS
5 | Freq: Four times a day (QID) | ORAL | Status: DC | PRN
Start: 2024-07-15 — End: 2024-07-16
  Administered 2024-07-16 (×2): 5 mg via ORAL

## 2024-07-15 MED ORDER — ENOXAPARIN SODIUM 40 MG/0.4ML IJ SOSY
40 | Freq: Every day | INTRAMUSCULAR | Status: DC
Start: 2024-07-15 — End: 2024-07-16
  Administered 2024-07-15: 17:00:00 40 mg via SUBCUTANEOUS

## 2024-07-15 MED FILL — PIPERACILLIN SOD-TAZOBACTAM SO 3.375 (3-0.375) G IV SOLR: 3.375 (3-0.375) g | INTRAVENOUS | Qty: 3375 | Fill #0

## 2024-07-15 MED FILL — MAGNESIUM SULFATE 2 GM/50ML IV SOLN: 2 GM/50ML | INTRAVENOUS | Qty: 50

## 2024-07-15 MED FILL — POTASSIUM CHLORIDE IN NACL 20-0.9 MEQ/L-% IV SOLN: 20-0.9 MEQ/L-% | INTRAVENOUS | Qty: 1000

## 2024-07-15 MED FILL — PIPERACILLIN SOD-TAZOBACTAM SO 3.375 (3-0.375) G IV SOLR: 3.375 (3-0.375) g | INTRAVENOUS | Qty: 3375

## 2024-07-15 MED FILL — MORPHINE SULFATE 2 MG/ML IJ SOLN: 2 mg/mL | INTRAMUSCULAR | Qty: 1 | Fill #0

## 2024-07-15 MED FILL — ENOXAPARIN SODIUM 40 MG/0.4ML IJ SOSY: 40 MG/0.4ML | INTRAMUSCULAR | Qty: 0.4

## 2024-07-15 MED FILL — TRAZODONE HCL 50 MG PO TABS: 50 mg | ORAL | Qty: 1 | Fill #0

## 2024-07-15 MED FILL — PANTOPRAZOLE SODIUM 40 MG PO TBEC: 40 mg | ORAL | Qty: 1

## 2024-07-15 MED FILL — CLONAZEPAM 0.5 MG PO TABS: 0.5 mg | ORAL | Qty: 2 | Fill #0

## 2024-07-15 MED FILL — NICOTINE STEP 2 14 MG/24HR TD PT24: 14 mg/(24.h) | TRANSDERMAL | Qty: 1

## 2024-07-15 MED FILL — POTASSIUM CHLORIDE IN NACL 20-0.9 MEQ/L-% IV SOLN: 20-0.9 MEQ/L-% | INTRAVENOUS | Qty: 1000 | Fill #0

## 2024-07-15 NOTE — Progress Notes (Signed)
 Bedside shift change report given to [facility] LPN (oncoming nurse) by Aldean Jewett RN (offgoing nurse). Report included the following information Nurse Handoff Report.

## 2024-07-15 NOTE — Progress Notes (Signed)
 Hospitalist Progress Note                Date:07/15/2024       Room:202/01  Patient Name:Yolanda Weber     Date of Birth:[birth year]     Age:53 y.o.        Subjective      Patient admitted for acute sigmoid diverticulitis  Patient still having off-and-on abdominal pain, worse with movement, no nausea/vomiting, had 1 loose bowel movement last night  Patient with no chest pain, shortness of breath, otherwise feels well, ambulating herself to the bathroom  Patient is afebrile and on room air  No other issues as per nursing to    Objective           Vitals Last 24 Hours:  Patient Vitals for the past 24 hrs:   Temp Pulse Resp BP SpO2   07/15/24 1105 98.1 F (36.7 C) 64 18 (!) 149/99 98 %   07/15/24 0851 -- -- 16 -- --   07/15/24 0821 -- -- 17 -- --   07/15/24 0801 97.9 F (36.6 C) 64 20 (!) 143/72 100 %   07/15/24 0440 -- -- 18 -- --   07/15/24 0352 97.7 F (36.5 C) 62 18 (!) 147/86 98 %   07/14/24 2343 97.9 F (36.6 C) 59 18 131/80 99 %   07/14/24 2007 -- -- 18 -- --   07/14/24 1929 98.1 F (36.7 C) 65 18 (!) 154/84 100 %   07/14/24 1507 97.9 F (36.6 C) 60 18 (!) 149/84 100 %   07/14/24 1202 -- -- 20 -- --        I/O (24Hr):    Intake/Output Summary (Last 24 hours) at 07/15/2024 1158  Last data filed at 07/14/2024 1503  Gross per 24 hour   Intake 1110.02 ml   Output --   Net 1110.02 ml       Physical Exam:  General: laying in bed, in no distress  Lungs:  Clear to auscultation bilaterally from front with no accessory muscle use  Heart:  Regular rate and rhythm, S1, S2 normal, no murmur, click, rub or gallop.  Abdomen:  Soft, obese, slight tenderness to deep palpation all throughout, no guarding. Bowel sounds normal.  Extremities: No edema  Psych: Patient is awake alert and oriented x 3      Active Medications           Current Facility-Administered Medications   Medication Dose Route Frequency    magnesium  sulfate 2000 mg in 50 mL IVPB premix  2,000 mg IntraVENous Once    pantoprazole  (PROTONIX ) tablet 40 mg   40 mg Oral BID WC    sodium chloride  flush 0.9 % injection 5-40 mL  5-40 mL IntraVENous 2 times per day    sodium chloride  flush 0.9 % injection 5-40 mL  5-40 mL IntraVENous PRN    0.9 % sodium chloride  infusion   IntraVENous PRN    potassium chloride  (KLOR-CON  M) extended release tablet 40 mEq  40 mEq Oral PRN    Or    potassium bicarb-citric acid  (EFFER-K) effervescent tablet 40 mEq  40 mEq Oral PRN    Or    potassium chloride  10 mEq/100 mL IVPB (Peripheral Line)  10 mEq IntraVENous PRN    magnesium  sulfate 2000 mg in 50 mL IVPB premix  2,000 mg IntraVENous PRN    ondansetron  (ZOFRAN -ODT) disintegrating tablet 4 mg  4 mg Oral Q8H PRN    Or    ondansetron  (  ZOFRAN ) injection 4 mg  4 mg IntraVENous Q6H PRN    polyethylene glycol (GLYCOLAX ) packet 17 g  17 g Oral Daily PRN    acetaminophen  (TYLENOL ) tablet 650 mg  650 mg Oral Q6H PRN    Or    acetaminophen  (TYLENOL ) suppository 650 mg  650 mg Rectal Q6H PRN    piperacillin -tazobactam (ZOSYN ) 3,375 mg in sodium chloride  0.9 % 50 mL IVPB (addEASE)  3,375 mg IntraVENous Q8H    0.9% NaCl with KCl 20 mEq infusion   IntraVENous Continuous    nicotine  (NICODERM CQ ) 14 MG/24HR 1 patch  1 patch TransDERmal Daily    morphine  (PF) injection 1 mg  1 mg IntraVENous Q4H PRN    traZODone  (DESYREL ) tablet 25 mg  25 mg Oral Nightly    aluminum  & magnesium  hydroxide-simethicone  (MAALOX PLUS) 200-200-20 MG/5ML suspension 30 mL  30 mL Oral Q6H PRN    clonazePAM  (KLONOPIN ) tablet 1 mg  1 mg Oral BID PRN         Allergies         Shellfish allergy and Ibuprofen       Labs/Imaging/Diagnostics      Labs:  Recent Results (from the past 48 hours)   CBC with Auto Differential    Collection Time: 07/13/24 10:31 PM   Result Value Ref Range    WBC 16.7 (H) 3.6 - 11.0 K/uL    RBC 4.88 3.80 - 5.20 M/uL    Hemoglobin 14.1 11.5 - 16.0 g/dL    Hematocrit 59.7 64.9 - 47.0 %    MCV 82.4 80.0 - 99.0 FL    MCH 28.9 26.0 - 34.0 PG    MCHC 35.1 30.0 - 36.5 g/dL    RDW 85.5 88.4 - 85.4 %    Platelets 425 (H)  150 - 400 K/uL    MPV 9.4 8.9 - 12.9 FL    Nucleated RBCs 0.0 0.0 PER 100 WBC    nRBC 0.00 0.00 - 0.01 K/uL    Neutrophils % 54.0 32.0 - 75.0 %    Lymphocytes % 36.3 12.0 - 49.0 %    Monocytes % 6.5 5.0 - 13.0 %    Immature Granulocytes % 0.4 0 - 0.5 %    Neutrophils Absolute 9.01 (H) 1.80 - 8.00 K/UL    Lymphocytes Absolute 6.04 (H) 0.80 - 3.50 K/UL    Monocytes Absolute 1.08 (H) 0.00 - 1.00 K/UL    Immature Granulocytes Absolute 0.07 (H) 0.00 - 0.04 K/UL    Eosinophils % 2.4 0.0 - 7.0 %    Basophils % 0.4 0.0 - 1.0 %    Eosinophils Absolute 0.40 0.00 - 0.40 K/UL    Basophils Absolute 0.07 0.00 - 0.10 K/UL    Differential Type Smear Scanned      RBC Comment Anisocytosis  1+       Comprehensive Metabolic Panel    Collection Time: 07/13/24 10:31 PM   Result Value Ref Range    Sodium 143 136 - 145 mmol/L    Potassium 2.7 (LL) 3.5 - 5.1 mmol/L    Chloride 108 97 - 108 mmol/L    CO2 25 21 - 32 mmol/L    Anion Gap 10 2 - 12 mmol/L    Glucose 98 65 - 100 mg/dL    BUN 13 6 - 20 mg/dL    Creatinine 9.43 9.44 - 1.02 mg/dL    BUN/Creatinine Ratio 23 (H) 12 - 20      Est, Glom Filt  Rate >90 >60 ml/min/1.4m2    Calcium 9.2 8.5 - 10.1 mg/dL    Total Bilirubin 0.8 0.2 - 1.0 mg/dL    AST 21 15 - 37 U/L    ALT 40 12 - 78 U/L    Alk Phosphatase 101 45 - 117 U/L    Total Protein 7.7 6.4 - 8.2 g/dL    Albumin 3.4 (L) 3.5 - 5.0 g/dL    Globulin 4.3 (H) 2.0 - 4.0 g/dL    Albumin/Globulin Ratio 0.8 (L) 1.1 - 2.2     Lipase    Collection Time: 07/13/24 10:31 PM   Result Value Ref Range    Lipase 19 13 - 75 U/L   Urinalysis with Reflex to Culture    Collection Time: 07/13/24 11:25 PM    Specimen: Urine   Result Value Ref Range    Color, UA Yellow/Straw      Appearance Clear Clear      Specific Gravity, UA 1.025 1.003 - 1.030      pH, Urine 6.0 5.0 - 8.0      Protein, UA Trace (A) Negative mg/dL    Glucose, Ur Negative Negative mg/dL    Ketones, Urine 40 (A) Negative mg/dL    Bilirubin, Urine Positive (A) Negative      Blood, Urine Large  (A) Negative      Urobilinogen, Urine 0.2 0.2 - 1.0 EU/dL    Nitrite, Urine Negative Negative      Leukocyte Esterase, Urine Trace (A) Negative      Bilirubin Confirmation, UA Positive (A) Negative      WBC, UA 5-10 0 - 5 /hpf    RBC, UA 0-5 0 - 3 /hpf    BACTERIA, URINE 2+ (A) Negative /hpf    Urine Culture if Indicated Culture not indicated by UA result Culture not indicated by UA result     Basic Metabolic Panel    Collection Time: 07/14/24  9:00 AM   Result Value Ref Range    Sodium 147 (H) 136 - 145 mmol/L    Potassium 3.1 (L) 3.5 - 5.1 mmol/L    Chloride 110 (H) 97 - 108 mmol/L    CO2 26 21 - 32 mmol/L    Anion Gap 11 2 - 12 mmol/L    Glucose 102 (H) 65 - 100 mg/dL    BUN 9 6 - 20 mg/dL    Creatinine 9.31 9.44 - 1.02 mg/dL    BUN/Creatinine Ratio 13 12 - 20      Est, Glom Filt Rate >90 >60 ml/min/1.53m2    Calcium 8.4 (L) 8.5 - 10.1 mg/dL   Magnesium     Collection Time: 07/14/24  9:00 AM   Result Value Ref Range    Magnesium  1.7 1.6 - 2.4 mg/dL   CBC with Auto Differential    Collection Time: 07/14/24  9:00 AM   Result Value Ref Range    WBC 13.9 (H) 3.6 - 11.0 K/uL    RBC 4.14 3.80 - 5.20 M/uL    Hemoglobin 12.0 11.5 - 16.0 g/dL    Hematocrit 65.8 (L) 35.0 - 47.0 %    MCV 82.4 80.0 - 99.0 FL    MCH 29.0 26.0 - 34.0 PG    MCHC 35.2 30.0 - 36.5 g/dL    RDW 85.6 88.4 - 85.4 %    Platelets 402 (H) 150 - 400 K/uL    MPV 9.0 8.9 - 12.9 FL    Nucleated RBCs 0.0 0.0 PER 100  WBC    nRBC 0.00 0.00 - 0.01 K/uL    Neutrophils % 50.7 32.0 - 75.0 %    Lymphocytes % 38.3 12.0 - 49.0 %    Monocytes % 6.8 5.0 - 13.0 %    Eosinophils % 3.5 0.0 - 7.0 %    Basophils % 0.3 0.0 - 1.0 %    Immature Granulocytes % 0.4 0 - 0.5 %    Neutrophils Absolute 7.06 1.80 - 8.00 K/UL    Lymphocytes Absolute 5.32 (H) 0.80 - 3.50 K/UL    Monocytes Absolute 0.95 0.00 - 1.00 K/UL    Eosinophils Absolute 0.48 (H) 0.00 - 0.40 K/UL    Basophils Absolute 0.04 0.00 - 0.10 K/UL    Immature Granulocytes Absolute 0.05 (H) 0.00 - 0.04 K/UL    Differential  Type Smear Scanned     Procalcitonin    Collection Time: 07/14/24  9:00 AM   Result Value Ref Range    Procalcitonin <0.05 (H) 0 ng/mL   C-Reactive Protein    Collection Time: 07/14/24  9:00 AM   Result Value Ref Range    CRP <0.29 0.00 - 0.30 mg/dL   Basic Metabolic Panel    Collection Time: 07/15/24  4:50 AM   Result Value Ref Range    Sodium 144 136 - 145 mmol/L    Potassium 3.4 (L) 3.5 - 5.1 mmol/L    Chloride 109 (H) 97 - 108 mmol/L    CO2 22 21 - 32 mmol/L    Anion Gap 13 (H) 2 - 12 mmol/L    Glucose 67 65 - 100 mg/dL    BUN 5 (L) 6 - 20 mg/dL    Creatinine 9.38 9.44 - 1.02 mg/dL    BUN/Creatinine Ratio 8 (L) 12 - 20      Est, Glom Filt Rate >90 >60 ml/min/1.17m2    Calcium 8.6 8.5 - 10.1 mg/dL   CBC with Auto Differential    Collection Time: 07/15/24  4:50 AM   Result Value Ref Range    WBC 13.1 (H) 3.6 - 11.0 K/uL    RBC 4.13 3.80 - 5.20 M/uL    Hemoglobin 11.8 11.5 - 16.0 g/dL    Hematocrit 65.5 (L) 35.0 - 47.0 %    MCV 83.3 80.0 - 99.0 FL    MCH 28.6 26.0 - 34.0 PG    MCHC 34.3 30.0 - 36.5 g/dL    RDW 85.6 88.4 - 85.4 %    Platelets 269 150 - 400 K/uL    MPV 11.8 8.9 - 12.9 FL    Nucleated RBCs 0.0 0.0 PER 100 WBC    nRBC 0.00 0.00 - 0.01 K/uL    Neutrophils % 51.0 32.0 - 75.0 %    Lymphocytes % 38.1 12.0 - 49.0 %    Monocytes % 6.5 5.0 - 13.0 %    Eosinophils % 3.9 0.0 - 7.0 %    Basophils % 0.3 0.0 - 1.0 %    Immature Granulocytes % 0.2 0 - 0.5 %    Neutrophils Absolute 6.67 1.80 - 8.00 K/UL    Lymphocytes Absolute 4.98 (H) 0.80 - 3.50 K/UL    Monocytes Absolute 0.85 0.00 - 1.00 K/UL    Eosinophils Absolute 0.51 (H) 0.00 - 0.40 K/UL    Basophils Absolute 0.04 0.00 - 0.10 K/UL    Immature Granulocytes Absolute 0.03 0.00 - 0.04 K/UL    Differential Type AUTOMATED     Magnesium     Collection Time: 07/15/24  4:50 AM   Result Value Ref Range    Magnesium  1.5 (L) 1.6 - 2.4 mg/dL        Imaging:  CT ABDOMEN PELVIS W IV CONTRAST Additional Contrast? None  Result Date: 07/14/2024  Acute sigmoid colon  diverticulitis, with no evidence of abscess or perforation; clinical follow-up is recommended to assure complete resolution. 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 Unchanged uterine fibroid. Electronically signed by Oneil Collier       Assessment//Plan           Acute sigmoid diverticulitis-present time of admission, secondary to CT scan, will continue with IV Zosyn , aim for 10 days of antibiotic therapy, will advance diet from n.p.o. to clear liquid on 7/22, continue IV normal saline hydration, started on p.o. oxycodone  on 7/22 for severe pain, continue IV morphine  for severe pain not controlled by p.o. agents, outpatient GI follow-up for colonoscopy and reevaluation after discharge    Leukocytosis-likely secondary to diverticulitis, white blood cell count 13,100 and stable, continue above care, check daily CBC to ensure stability/improvement of leukocytosis    Abnormal urine analysis-patient with no urinary symptoms, will continue IV Zosyn  for above as that should also cover urine pathology    Fibromyalgia-continue home medications and supportive care, no inpatient issues    Hypokalemia-replaced with p.o. potassium chloride , repeat potassium level with a.m. blood work    Hypomagnesemia-replaced with IV magnesium  sulfate, repeat magnesium  level with a.m. blood work    Tobacco abuse-continue nicotine  replacement    DVT prophylaxis-Lovenox       Full code  Father is surrogate decision maker    Expecting discharge home on p.o. antibiotics in the next 1 to 2 days          Discussion/MDM: Patient with multiple medical comorbidities, each with high likelihood for morbidity and mortality if left untreated.   I have reviewed patient's presenting subjective and objective findings, as well as all laboratory studies, imaging studies, and vital signs to date as well as treatment rendered and patient's response to those treatments.  In addition, prior medical,  surgical and relevant social and family histories were reviewed. I have discussed management plan with patient/family and with nursing staff.      Toxic drug monitoring:    This is dictation was done by dragon, Advertising account planner. Quite often unanticipated grammatical, syntax, homophones and other interpretive errors or inadvertently transcribed by the computer software. Please excuse errors that have escaped final proofreading. Thank you.       Spent 51 minutes evaluting and coordinating patients care      Electronically signed by Harden Drilling, MD on 07/15/2024 at 11:58 AM

## 2024-07-15 NOTE — Plan of Care (Signed)
 Problem: Safety - Adult  Goal: Free from fall injury  07/15/2024 2109 by Arloa Clutter, LPN  Outcome: Progressing  07/15/2024 1356 by Doreatha Lye, RN  Outcome: Progressing

## 2024-07-15 NOTE — Plan of Care (Signed)
 Problem: Safety - Adult  Goal: Free from fall injury  Outcome: Progressing

## 2024-07-15 NOTE — Plan of Care (Signed)
 Problem: Safety - Adult  Goal: Free from fall injury  07/15/2024 1356 by Doreatha Lye, RN  Outcome: Progressing  07/15/2024 0421 by Joshua Montie CROME, LPN  Outcome: Progressing

## 2024-07-15 NOTE — Progress Notes (Signed)
 Patient requested Klonopin  for Anxiety and Oxycodone  for pain.    Refused Trazodone .

## 2024-07-15 NOTE — Progress Notes (Signed)
 Rested well with Klonopin  and morphine .  Remain NPO except medicines and sips

## 2024-07-16 LAB — CBC WITH AUTO DIFFERENTIAL
Basophils %: 0.4 % (ref 0.0–1.0)
Basophils Absolute: 0.04 K/UL (ref 0.00–0.10)
Eosinophils %: 3.4 % (ref 0.0–7.0)
Eosinophils Absolute: 0.36 K/UL (ref 0.00–0.40)
Hematocrit: 33.9 % — ABNORMAL LOW (ref 35.0–47.0)
Hemoglobin: 11.9 g/dL (ref 11.5–16.0)
Immature Granulocytes %: 0.3 % (ref 0–0.5)
Immature Granulocytes Absolute: 0.03 K/UL (ref 0.00–0.04)
Lymphocytes %: 36.1 % (ref 12.0–49.0)
Lymphocytes Absolute: 3.87 K/UL — ABNORMAL HIGH (ref 0.80–3.50)
MCH: 28.7 pg (ref 26.0–34.0)
MCHC: 35.1 g/dL (ref 30.0–36.5)
MCV: 81.7 FL (ref 80.0–99.0)
MPV: 9.6 FL (ref 8.9–12.9)
Monocytes %: 7.6 % (ref 5.0–13.0)
Monocytes Absolute: 0.82 K/UL (ref 0.00–1.00)
Neutrophils %: 52.2 % (ref 32.0–75.0)
Neutrophils Absolute: 5.6 K/UL (ref 1.80–8.00)
Nucleated RBCs: 0 /100{WBCs}
Platelets: 394 K/uL (ref 150–400)
RBC: 4.15 M/uL (ref 3.80–5.20)
RDW: 14.1 % (ref 11.5–14.5)
WBC: 10.7 K/uL (ref 3.6–11.0)
nRBC: 0 K/uL (ref 0.00–0.01)

## 2024-07-16 LAB — BASIC METABOLIC PANEL
Anion Gap: 9 mmol/L (ref 2–12)
BUN/Creatinine Ratio: 6 — ABNORMAL LOW (ref 12–20)
BUN: 4 mg/dL — ABNORMAL LOW (ref 6–20)
CO2: 27 mmol/L (ref 21–32)
Calcium: 8.9 mg/dL (ref 8.5–10.1)
Chloride: 109 mmol/L — ABNORMAL HIGH (ref 97–108)
Creatinine: 0.67 mg/dL (ref 0.55–1.02)
Est, Glom Filt Rate: >90 ml/min/1.73m2 (ref >60)
Glucose: 111 mg/dL — ABNORMAL HIGH (ref 65–100)
Potassium: 3.2 mmol/L — ABNORMAL LOW (ref 3.5–5.1)
Sodium: 145 mmol/L (ref 136–145)

## 2024-07-16 LAB — MAGNESIUM: Magnesium: 1.9 mg/dL (ref 1.6–2.4)

## 2024-07-16 LAB — CULTURE, URINE: Colony count: >100000

## 2024-07-16 MED ORDER — METRONIDAZOLE 500 MG PO TABS
500 | ORAL_TABLET | Freq: Three times a day (TID) | ORAL | 0 refills | Status: DC
Start: 2024-07-16 — End: 2024-07-16

## 2024-07-16 MED ORDER — CEFDINIR 300 MG PO CAPS
300 | ORAL_CAPSULE | Freq: Two times a day (BID) | ORAL | 0 refills | Status: DC
Start: 2024-07-16 — End: 2024-07-16

## 2024-07-16 MED ORDER — SACCHAROMYCES BOULARDII 250 MG PO CAPS
250 | ORAL_CAPSULE | Freq: Two times a day (BID) | ORAL | 0 refills | Status: AC
Start: 2024-07-16 — End: 2024-07-26

## 2024-07-16 MED ORDER — CEFDINIR 300 MG PO CAPS
300 | ORAL_CAPSULE | Freq: Two times a day (BID) | ORAL | 0 refills | Status: AC
Start: 2024-07-16 — End: 2024-07-24

## 2024-07-16 MED ORDER — METRONIDAZOLE 500 MG PO TABS
500 | ORAL_TABLET | Freq: Three times a day (TID) | ORAL | 0 refills | Status: AC
Start: 2024-07-16 — End: 2024-07-24

## 2024-07-16 MED ORDER — OXYCODONE HCL 5 MG PO TABS
5 | ORAL_TABLET | Freq: Four times a day (QID) | ORAL | 0 refills | Status: DC | PRN
Start: 2024-07-16 — End: 2024-07-16

## 2024-07-16 MED ORDER — OXYCODONE HCL 5 MG PO TABS
5 | ORAL_TABLET | Freq: Four times a day (QID) | ORAL | 0 refills | Status: AC | PRN
Start: 2024-07-16 — End: 2024-07-19

## 2024-07-16 MED ORDER — SACCHAROMYCES BOULARDII 250 MG PO CAPS
250 | ORAL_CAPSULE | Freq: Two times a day (BID) | ORAL | 0 refills | Status: DC
Start: 2024-07-16 — End: 2024-07-16

## 2024-07-16 MED FILL — POTASSIUM CHLORIDE IN NACL 20-0.9 MEQ/L-% IV SOLN: 20-0.9 MEQ/L-% | INTRAVENOUS | Qty: 1000

## 2024-07-16 MED FILL — PIPERACILLIN SOD-TAZOBACTAM SO 3.375 (3-0.375) G IV SOLR: 3.375 (3-0.375) g | INTRAVENOUS | Qty: 3375

## 2024-07-16 MED FILL — PANTOPRAZOLE SODIUM 40 MG PO TBEC: 40 mg | ORAL | Qty: 1

## 2024-07-16 MED FILL — TRAZODONE HCL 50 MG PO TABS: 50 mg | ORAL | Qty: 1

## 2024-07-16 MED FILL — OXYCODONE HCL 5 MG PO TABS: 5 mg | ORAL | Qty: 1

## 2024-07-16 MED FILL — MORPHINE SULFATE 2 MG/ML IJ SOLN: 2 mg/mL | INTRAMUSCULAR | Qty: 1 | Fill #0

## 2024-07-16 MED FILL — CLONAZEPAM 0.5 MG PO TABS: 0.5 mg | ORAL | Qty: 2

## 2024-07-16 MED FILL — NICOTINE STEP 2 14 MG/24HR TD PT24: 14 mg/(24.h) | TRANSDERMAL | Qty: 1

## 2024-07-16 MED FILL — KLOR-CON M20 20 MEQ PO TBCR: 20 meq | ORAL | Qty: 2

## 2024-07-16 NOTE — Discharge Summary (Addendum)
 Physician Discharge Summary       Patient: Yolanda Weber MRN: [id number]     Date of Birth: 1971-09-09  Age: 53 y.o.  Sex: female    PCP: [name], MD    Allergies: Shellfish allergy and Ibuprofen    Admit date: 07/13/2024  Admitting Provider: Harden Drilling, MD    Discharge date: 07/16/2024  Discharging Provider: Harden Drilling, MD    * Admission Diagnoses:   Hypokalemia [E87.6]  Diverticulitis of colon [K57.32]  Acute diverticulitis [K57.92]  Sigmoid diverticulitis [K57.32]      * Discharge Diagnoses:    Acute sigmoid diverticulitis  Leukocytosis-likely secondary to diverticulitis, resolved at discharge  Abnormal urine analysis  Hypertension-was on diltiazem which was held on this admission and will be held at time of discharge as blood pressure/heart rate is well-controlled without any medications  Fibromyalgia  Hypokalemia  Hypomagnesemia  Tobacco abuse      Chief Complaint   Patient presents with    Abdominal Cramping    Diarrhea    Vomiting        Presentation on 07/13/2024  HPI as per admitting provider on 07/13/2024        [facility] Course:     53 year old female with a history of hypertension on diltiazem, tobacco abuse, fibromyalgia presented for evaluation of left lower quadrant abdominal pain that started 1 day prior to admission on July 19.  Pain was associated with some nausea and some diarrhea, in the emergency room CT abdomen and pelvis showed acute sigmoid diverticulitis, there was leukocytosis but patient did not meet SIRS/sepsis criteria, there was abnormal urine analysis however patient without any urinary symptoms, there was hypokalemia and hypomagnesemia that was replaced, patient was started on IV Zosyn  and admitted to the medical surgical floor.  Patient was initially n.p.o. on IV fluids with IV morphine  pain control, diet was slowly started hide clear liquids and advance to full liquids which patient tolerated prior to discharge.  Patient's  pain improved at time of discharge, leukocytosis resolved.  In regards to patient's diverticulitis, she will complete 8 more days of p.o. cefdinir  and p.o. Flagyl  to finish 10-day course.  Patient will follow-up with gastroenterologist within 1 month for reevaluation and consideration of colonoscopy to determine etiology of diverticulitis.  Patient will continue advancing diet as tolerated and will return to the emergency room if she is no longer able to tolerate liquids or if pain becomes more severe and cannot be controlled at home.  No abnormal urine related treatment will be needed as this was not a true urinary tract infection.  Patient will continue with nicotine  replacement in setting of tobacco abuse, continue home therapies for fibromyalgia.  Patient will be discontinued on diltiazem p.o. she was utilizing for hypertension as her blood pressure and heart rate are well-controlled without use of any agents on this admission.  Patient will follow-up with PCP within 1 week for blood pressure reevaluation and medication changes as need be.  Patient will also be discontinued on Carafate  and cholestyramine  as these were old medications on patient's medication list and are no longer needed.  All other issues not discussed were otherwise stable.  Patient was ambulatory and able to independently complete ADLs prior to discharge and will be discharged home without services.    Patient will be discontinued and lactobacillus and started on Florastor for probiotic, she will take Florastor 250 mg twice daily for 10 days      * Procedures:   * No  surgery found *        Consults:   None    Vitals Last 24 Hours:  Patient Vitals for the past 24 hrs:   Temp Pulse Resp BP SpO2   07/16/24 0816 -- -- 20 -- --   07/16/24 0716 98.1 F (36.7 C) 65 20 128/79 99 %   07/16/24 0339 97.9 F (36.6 C) 54 16 122/78 96 %   07/16/24 0103 -- -- 18 -- --   07/16/24 0033 -- -- 18 -- --   07/15/24 2340 97.9 F (36.6 C) 60 16 (!) 140/80 97 %    07/15/24 2124 -- -- 18 -- --   07/15/24 2054 -- -- 18 -- --   07/15/24 1959 98.2 F (36.8 C) 55 18 (!) 145/86 100 %   07/15/24 1808 -- -- 16 -- --   07/15/24 1738 -- -- 16 -- --   07/15/24 1605 98.2 F (36.8 C) 76 20 (!) 147/79 100 %   07/15/24 1105 98.1 F (36.7 C) 64 18 (!) 149/99 98 %   07/15/24 0851 -- -- 16 -- --        Discharge Exam:  General: laying in bed, in no distress, comfortable  Lungs:  Clear to auscultation bilaterally from front with no accessory muscle use  Heart:  Regular rate and rhythm, S1, S2 normal, no murmur, click, rub or gallop.  Abdomen:  Soft, obese, non tender, no guarding. Bowel sounds normal.  Extremities: No edema  Psych: Patient is awake alert and oriented x 3    Labs:    Recent Results (from the past 24 hours)   CBC with Auto Differential    Collection Time: 07/16/24  6:37 AM   Result Value Ref Range    WBC 10.7 3.6 - 11.0 K/uL    RBC 4.15 3.80 - 5.20 M/uL    Hemoglobin 11.9 11.5 - 16.0 g/dL    Hematocrit 66.0 (L) 35.0 - 47.0 %    MCV 81.7 80.0 - 99.0 FL    MCH 28.7 26.0 - 34.0 PG    MCHC 35.1 30.0 - 36.5 g/dL    RDW 85.8 88.4 - 85.4 %    Platelets 394 150 - 400 K/uL    MPV 9.6 8.9 - 12.9 FL    Nucleated RBCs 0.0 0.0 PER 100 WBC    nRBC 0.00 0.00 - 0.01 K/uL    Neutrophils % 52.2 32.0 - 75.0 %    Lymphocytes % 36.1 12.0 - 49.0 %    Monocytes % 7.6 5.0 - 13.0 %    Eosinophils % 3.4 0.0 - 7.0 %    Basophils % 0.4 0.0 - 1.0 %    Immature Granulocytes % 0.3 0 - 0.5 %    Neutrophils Absolute 5.60 1.80 - 8.00 K/UL    Lymphocytes Absolute 3.87 (H) 0.80 - 3.50 K/UL    Monocytes Absolute 0.82 0.00 - 1.00 K/UL    Eosinophils Absolute 0.36 0.00 - 0.40 K/UL    Basophils Absolute 0.04 0.00 - 0.10 K/UL    Immature Granulocytes Absolute 0.03 0.00 - 0.04 K/UL    Differential Type AUTOMATED     Basic Metabolic Panel    Collection Time: 07/16/24  6:37 AM   Result Value Ref Range    Sodium 145 136 - 145 mmol/L    Potassium 3.2 (L) 3.5 - 5.1 mmol/L    Chloride 109 (H) 97 - 108 mmol/L    CO2 27 21  - 32 mmol/L  Anion Gap 9 2 - 12 mmol/L    Glucose 111 (H) 65 - 100 mg/dL    BUN 4 (L) 6 - 20 mg/dL    Creatinine 9.32 9.44 - 1.02 mg/dL    BUN/Creatinine Ratio 6 (L) 12 - 20      Est, Glom Filt Rate >90 >60 ml/min/1.73m2    Calcium 8.9 8.5 - 10.1 mg/dL   Magnesium     Collection Time: 07/16/24  6:37 AM   Result Value Ref Range    Magnesium  1.9 1.6 - 2.4 mg/dL     .result      Imaging:  CT ABDOMEN PELVIS W IV CONTRAST Additional Contrast? None  Result Date: 07/14/2024  Acute sigmoid colon diverticulitis, with no evidence of abscess or perforation; clinical follow-up is recommended to assure complete resolution. 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 Unchanged uterine fibroid. Electronically signed by Oneil Collier               * Discharge Condition: Stable  * Disposition: Home w/Family      Discharge Medications:     Medication List        START taking these medications      cefdinir  300 MG capsule  Commonly known as: OMNICEF   Take 1 capsule by mouth 2 times daily for 8 days     metroNIDAZOLE  500 MG tablet  Commonly known as: FLAGYL   Take 1 tablet by mouth 3 times daily for 8 days     oxyCODONE  5 MG immediate release tablet  Commonly known as: ROXICODONE   Take 1 tablet by mouth every 6 hours as needed for Pain (as needed for severe pain not controlled by Tylenol ) for up to 3 days. Max Daily Amount: 20 mg     saccharomyces boulardii 250 MG capsule  Commonly known as: Florastor  Take 1 capsule by mouth 2 times daily for 10 days            CONTINUE taking these medications      clonazePAM  1 MG tablet  Commonly known as: KLONOPIN      meloxicam  15 MG tablet  Commonly known as: MOBIC      pantoprazole  40 MG tablet  Commonly known as: PROTONIX   Take 1 tablet by mouth 2 times daily (with meals)            STOP taking these medications      cholestyramine  light 4 g packet     dilTIAZem 30 MG immediate release tablet  Commonly known as: CARDIZEM     lactobacillus  capsule     sucralfate  1 GM tablet  Commonly known as: Carafate                Where to Get Your Medications        These medications were sent to [facility] [address] [place] - [address] - P [phone] [address] [phone]  [address], [place] [address]      Phone: [phone]   cefdinir  300 MG capsule  metroNIDAZOLE  500 MG tablet  oxyCODONE  5 MG immediate release tablet  saccharomyces boulardii 250 MG capsule           * Follow-up Care/Patient Instructions:  Activity: activity as tolerated  Diet: cardiac diet      [name], MD  [address]  [place] [address]  [phone]    Follow up in 1 week(s)  Please follow up with your  PCP on Thursday July 31st at 12:00pm.      Discharge time: 36 minutes    This is dictation was done by dragon, Advertising account planner. Quite often unanticipated grammatical, syntax, homophones and other interpretive errors or inadvertently transcribed by the computer software. Please excuse errors that have escaped final proofreading. Thank you.       Signed:  Harden Drilling, MD  07/16/2024  8:29 AM

## 2024-07-16 NOTE — Progress Notes (Signed)
 Bedside shift change report given to Lakita Sahlin(oncoming nurse) by Michele Rockers (offgoing nurse). Report included the following information Nurse Handoff Report.

## 2024-07-16 NOTE — Plan of Care (Signed)
 Problem: Safety - Adult  Goal: Free from fall injury  07/16/2024 0904 by Delores Brunner D, LPN  Outcome: Progressing  07/15/2024 2109 by Arloa Clutter, LPN  Outcome: Progressing

## 2024-07-16 NOTE — Discharge Instructions (Signed)
 Activity as tolerated

## 2024-07-16 NOTE — Progress Notes (Signed)
 Scheduled patient a primary care appointment with Dr.Ackerman on Thursday July 31st at 12:00 pm. Patient is aware of appointment time and date.

## 2024-07-16 NOTE — Discharge Instructions (Signed)
Activity: activity as tolerated  Diet: cardiac diet

## 2024-07-16 NOTE — Progress Notes (Signed)
 LPN assessment reviewed.

## 2024-07-16 NOTE — Plan of Care (Signed)
 Problem: Safety - Adult  Goal: Free from fall injury  07/16/2024 0906 by Delores Gabriel BIRCH, LPN  Outcome: Completed  07/16/2024 0904 by Delores Gabriel D, LPN  Outcome: Progressing  07/15/2024 2109 by Arloa Clutter, LPN  Outcome: Progressing

## 2024-07-16 NOTE — Progress Notes (Signed)
 Discharge instructions given question answered pt. Verbalized understanding pt. Transferred via w/c to private [facility] for transport home

## 2024-07-17 LAB — ENTERIC BACT PANEL, DNA
CAMPYLOBACTER SPECIES, DNA: NEGATIVE
ENTEROTOXIGEN E COLI, DNA: NEGATIVE
P. SHIGELLOIDES, DNA: NEGATIVE
SALMONELLA SPECIES, DNA: NEGATIVE
SHIGA TOXIN PRODUCING, DNA: NEGATIVE
SHIGELLA SPECIES, DNA: NEGATIVE
VIBRIO SPECIES, DNA: NEGATIVE
Y. ENTEROCOLITICA, DNA: NEGATIVE

## 2024-08-28 ENCOUNTER — Ambulatory Visit
Admit: 2024-08-28 | Discharge: 2024-08-28 | Payer: Medicaid (Managed Care) | Attending: Gastroenterology | Primary: Internal Medicine

## 2024-08-28 DIAGNOSIS — R1032 Left lower quadrant pain: Principal | ICD-10-CM

## 2024-08-28 MED ORDER — POLYETHYLENE GLYCOL 3350 17 GM/SCOOP PO POWD
17 | ORAL | 0 refills | 20.00000 days | Status: DC
Start: 2024-08-28 — End: 2024-09-24

## 2024-08-28 NOTE — Progress Notes (Signed)
 Have you been to the ER, urgent care clinic since your last visit?  Hospitalized since your last visit?   YES - When: approximately 1 months ago.  Where and Why: [facility] for Diverticulitis..    Have you seen or consulted any other health care providers outside our system since your last visit?   NO    Have you had a mammogram?"   NO    Date of last Mammogram: 10/09/2016      "Have you had a pap smear?"    NO    No cervical cancer screening on file       "Have you had a colorectal cancer screening such as a colonoscopy/FIT/Cologuard?    NO    No colonoscopy on file  No cologuard on file  No FIT/FOBT on file   No flexible sigmoidoscopy on file     BP 109/71 (BP Site: Right Upper Arm, Patient Position: Sitting, BP Cuff Size: Medium Adult)   Pulse 76   Temp 98.6 F (37 C) (Oral)   Resp 16   Ht 1.575 m (5' 2)   Wt 84.1 kg (185 lb 6 oz)   SpO2 96%   BMI 33.91 kg/m

## 2024-08-29 NOTE — Discharge Summary (Signed)
 Outpatient Discharge Summary    BRIEF OVERVIEW  Admitting Provider: Guerry Perl, MD  Discharge Provider: Guerry Perl, MD  Primary Care Physician at Discharge: Lonni Finical, MD     Admission Date: 08/29/2024     Discharge Date: 08/29/2024       Medication List        CONTINUE taking these medications      acetaminophen  500 MG tablet  Commonly known as: Tylenol   Take 500 mg by mouth every 6 hours as needed for mild pain.     docusate sodium 100 MG capsule  Commonly known as: Colace  Take 100 mg by mouth daily as needed.            ASK your doctor about these medications      diclofenac sodium 75 MG EC tablet  Commonly known as: Voltaren  Take 1 tablet by mouth 2 times daily. Do not crush, chew, or split.              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 *     DETAILS OF HOSPITAL STAY    Reason for Hospitalization:  Sacroiliac joint dysfunction of both sides [M53.3]  Outpatient procedure    Hospital Course:  Uneventful    Discharge Condition:  Stable    Discharge Disposition:  Home    Procedures Performed:    08/29/2024     Procedure(s) (LRB):  Left sacroiliac joint injection under fluoroscopy guidance with intravenous moderate sedation (Left)         Information given to patient/family/representative:  Information regarding appts, diagnoses, post-hospital care instructions and education materials is provided to the patient/family/rep in writing on the After Visit Summary at the time of Discharge. A copy for the 'Provider' (PCP or Referring) is also given.    Discharge Instructions to Patient  [facility]  Pain Management Procedures  Discharge and Home Care Instructions    General Instructions  *For your safety, you MUST have a responsible adult to accompany you to the hospital and stay while you have your procedure, and then drive you home.  *Remember, you may not experience the full effect of you procedure for up to 4 days.   Activities  *You should go home and rest for the remainder of the day. Limit your activities for the next 12 hours.  *Do NOT drive or operate heavy machinery such as lawn mowers and saws.  *Do NOT engage in any activities requiring the use of the affected extremity (arm or leg) until normal sensation and strength have returned to that extremity.   *Avoid making legal decisions today.  *You may have dizziness from the medicine, so be careful as you walk around and/or climb stairs.  *If you have young children, be careful in holding or carrying the child.   Injection site and dressing care  *Keep the site clean and dry.  *Contact you family physician if there is any drainage or bleeding from the site.   *Leave the dressing in place for 24  hours  Pain  *You may apply ice to the site for out of each hour to relieve pain.     **If you feel that you are having abnormal side effects from the procedure, contact Dr. Anderson at Pain Management during normal business hours Monday through Friday at [phone]. If it is an emergency call 911 or go to the nearest emergency room.      To reach providers in the [facility]:  Call Telepage at [phone] and page the discharge service or call [phone]. [facility] provider numbers can also be accessed via the web at: [email or web address]

## 2024-09-09 ENCOUNTER — Inpatient Hospital Stay
Admission: EM | Admit: 2024-09-09 | Discharge: 2024-09-12 | Disposition: A | Discharge status: Home / Self Care | Payer: Medicaid (Managed Care) | Admitting: Family Medicine

## 2024-09-09 ENCOUNTER — Encounter

## 2024-09-09 ENCOUNTER — Emergency Department: Admit: 2024-09-09 | Payer: Medicaid (Managed Care) | Primary: Internal Medicine

## 2024-09-09 DIAGNOSIS — K5792 Diverticulitis of intestine, part unspecified, without perforation or abscess without bleeding: Principal | ICD-10-CM

## 2024-09-09 DIAGNOSIS — K5732 Diverticulitis of large intestine without perforation or abscess without bleeding: Secondary | ICD-10-CM

## 2024-09-09 LAB — CBC WITH AUTO DIFFERENTIAL
Basophils %: 0.4 % (ref 0.0–1.0)
Basophils Absolute: 0.06 K/UL (ref 0.00–0.10)
Eosinophils %: 1.4 % (ref 0.0–7.0)
Eosinophils Absolute: 0.2 K/UL (ref 0.00–0.40)
Hematocrit: 38 % (ref 35.0–47.0)
Hemoglobin: 13.2 g/dL (ref 11.5–16.0)
Immature Granulocytes %: 0.4 % (ref 0–0.5)
Immature Granulocytes Absolute: 0.06 K/UL — ABNORMAL HIGH (ref 0.00–0.04)
Lymphocytes %: 28.8 % (ref 12.0–49.0)
Lymphocytes Absolute: 4.03 K/UL — ABNORMAL HIGH (ref 0.80–3.50)
MCH: 28.4 pg (ref 26.0–34.0)
MCHC: 34.7 g/dL (ref 30.0–36.5)
MCV: 81.7 FL (ref 80.0–99.0)
MPV: 9.8 FL (ref 8.9–12.9)
Monocytes %: 8.9 % (ref 5.0–13.0)
Monocytes Absolute: 1.24 K/UL — ABNORMAL HIGH (ref 0.00–1.00)
Neutrophils %: 60.1 % (ref 32.0–75.0)
Neutrophils Absolute: 8.41 K/UL — ABNORMAL HIGH (ref 1.80–8.00)
Nucleated RBCs: 0 /100{WBCs}
Platelets: 403 K/uL — ABNORMAL HIGH (ref 150–400)
RBC: 4.65 M/uL (ref 3.80–5.20)
RDW: 14.8 % — ABNORMAL HIGH (ref 11.5–14.5)
WBC: 14 K/uL — ABNORMAL HIGH (ref 3.6–11.0)
nRBC: 0 K/uL (ref 0.00–0.01)

## 2024-09-09 LAB — TROPONIN: Troponin T: <6 ng/L (ref 0–14)

## 2024-09-09 LAB — COMPREHENSIVE METABOLIC PANEL
ALT: 64 U/L — ABNORMAL HIGH (ref 10–35)
AST: 63 U/L — ABNORMAL HIGH (ref 10–35)
Albumin/Globulin Ratio: 0.8 — ABNORMAL LOW (ref 1.1–2.2)
Albumin: 3.7 g/dL (ref 3.5–5.2)
Alk Phosphatase: 105 U/L — ABNORMAL HIGH (ref 35–104)
Anion Gap: 22 mmol/L — ABNORMAL HIGH (ref 2–14)
BUN/Creatinine Ratio: 28 — ABNORMAL HIGH (ref 12–20)
BUN: 19 mg/dL (ref 6–20)
CO2: 16 mmol/L — ABNORMAL LOW (ref 20–29)
Calcium: 9.8 mg/dL (ref 8.6–10.0)
Chloride: 102 mmol/L (ref 98–107)
Creatinine: 0.7 mg/dL (ref 0.60–1.00)
Est, Glom Filt Rate: >90 ml/min/1.73m2 (ref >59)
Globulin: 4.3 g/dL — ABNORMAL HIGH (ref 2.0–4.0)
Glucose: 109 mg/dL — ABNORMAL HIGH (ref 65–100)
Potassium: 5.6 mmol/L — ABNORMAL HIGH (ref 3.5–5.1)
Sodium: 140 mmol/L (ref 136–145)
Total Bilirubin: 0.5 mg/dL (ref 0.0–1.2)
Total Protein: 8 g/dL (ref 6.4–8.3)

## 2024-09-09 LAB — MAGNESIUM: Magnesium: 1.8 mg/dL (ref 1.6–2.6)

## 2024-09-09 LAB — PROCALCITONIN: Procalcitonin: <0.02 ng/mL

## 2024-09-09 LAB — C-REACTIVE PROTEIN: CRP: <0.3 mg/dL (ref 0.0–0.5)

## 2024-09-09 LAB — LIPASE: Lipase: 24 U/L (ref 13–60)

## 2024-09-09 MED ORDER — SODIUM CHLORIDE (PF) 0.9 % IJ SOLN
0.9 | Freq: Two times a day (BID) | INTRAMUSCULAR | Status: DC
Start: 2024-09-09 — End: 2024-09-12
  Administered 2024-09-09 – 2024-09-12 (×6): 40 mg via INTRAVENOUS

## 2024-09-09 MED ORDER — HYDROMORPHONE HCL 1 MG/ML IJ SOLN
1 | INTRAMUSCULAR | Status: DC | PRN
Start: 2024-09-09 — End: 2024-09-12
  Administered 2024-09-09 – 2024-09-12 (×13): 1 mg via INTRAVENOUS

## 2024-09-09 MED ORDER — ACETAMINOPHEN 650 MG RE SUPP
650 | Freq: Four times a day (QID) | RECTAL | Status: DC | PRN
Start: 2024-09-09 — End: 2024-09-12

## 2024-09-09 MED ORDER — POTASSIUM CHLORIDE 10 MEQ/100ML IV SOLN
10 | INTRAVENOUS | Status: DC | PRN
Start: 2024-09-09 — End: 2024-09-12

## 2024-09-09 MED ORDER — LEVOFLOXACIN IN D5W 750 MG/150ML IV SOLN
750 | INTRAVENOUS | Status: DC
Start: 2024-09-09 — End: 2024-09-12
  Administered 2024-09-09 – 2024-09-11 (×3): 750 mg via INTRAVENOUS

## 2024-09-09 MED ORDER — NORMAL SALINE FLUSH 0.9 % IV SOLN
0.9 | INTRAVENOUS | Status: DC | PRN
Start: 2024-09-09 — End: 2024-09-12
  Administered 2024-09-10 – 2024-09-12 (×6): 10 mL via INTRAVENOUS

## 2024-09-09 MED ORDER — POTASSIUM BICARB-CITRIC ACID 20 MEQ PO TBEF
20 | ORAL | Status: DC | PRN
Start: 2024-09-09 — End: 2024-09-12

## 2024-09-09 MED ORDER — ONDANSETRON 4 MG PO TBDP
4 | Freq: Three times a day (TID) | ORAL | Status: DC | PRN
Start: 2024-09-09 — End: 2024-09-12
  Administered 2024-09-10 – 2024-09-11 (×2): 4 mg via ORAL

## 2024-09-09 MED ORDER — SODIUM CHLORIDE 0.9 % IV BOLUS
0.9 | Freq: Once | INTRAVENOUS | Status: AC
Start: 2024-09-09 — End: 2024-09-09
  Administered 2024-09-09: 13:00:00 1000 mL via INTRAVENOUS

## 2024-09-09 MED ORDER — ONDANSETRON HCL 4 MG/2ML IJ SOLN
4 | Freq: Four times a day (QID) | INTRAMUSCULAR | Status: DC | PRN
Start: 2024-09-09 — End: 2024-09-12
  Administered 2024-09-11 – 2024-09-12 (×2): 4 mg via INTRAVENOUS

## 2024-09-09 MED ORDER — ACETAMINOPHEN 325 MG PO TABS
325 | Freq: Four times a day (QID) | ORAL | Status: DC | PRN
Start: 2024-09-09 — End: 2024-09-12

## 2024-09-09 MED ORDER — SODIUM CHLORIDE 0.9 % IV SOLN
0.9 | INTRAVENOUS | Status: DC | PRN
Start: 2024-09-09 — End: 2024-09-12
  Administered 2024-09-10: 02:00:00 via INTRAVENOUS

## 2024-09-09 MED ORDER — KETOROLAC TROMETHAMINE 30 MG/ML IJ SOLN
30 | Freq: Four times a day (QID) | INTRAMUSCULAR | Status: DC
Start: 2024-09-09 — End: 2024-09-09

## 2024-09-09 MED ORDER — KETOROLAC TROMETHAMINE 15 MG/ML IJ SOLN
15 | Freq: Once | INTRAMUSCULAR | Status: DC
Start: 2024-09-09 — End: 2024-09-09

## 2024-09-09 MED ORDER — MORPHINE SULFATE (PF) 4 MG/ML IJ SOLN
4 | INTRAMUSCULAR | Status: AC
Start: 2024-09-09 — End: 2024-09-09
  Administered 2024-09-09: 13:00:00 4 mg via INTRAVENOUS

## 2024-09-09 MED ORDER — SODIUM CHLORIDE 0.45 % IV SOLN
0.45 | INTRAVENOUS | Status: DC
Start: 2024-09-09 — End: 2024-09-10
  Administered 2024-09-09 – 2024-09-10 (×2): via INTRAVENOUS

## 2024-09-09 MED ORDER — METRONIDAZOLE 500 MG/100ML IV SOLN
500 | INTRAVENOUS | Status: AC
Start: 2024-09-09 — End: 2024-09-09
  Administered 2024-09-09: 16:00:00 500 mg via INTRAVENOUS

## 2024-09-09 MED ORDER — NICOTINE 14 MG/24HR TD PT24
14 | Freq: Every day | TRANSDERMAL | Status: DC
Start: 2024-09-09 — End: 2024-09-12
  Administered 2024-09-10 – 2024-09-12 (×4): 1 via TRANSDERMAL

## 2024-09-09 MED ORDER — SODIUM CHLORIDE 0.9 % IV SOLN (ADDEASE)
0.9 | INTRAVENOUS | Status: AC
Start: 2024-09-09 — End: 2024-09-09
  Administered 2024-09-09: 16:00:00 2000 mg via INTRAVENOUS

## 2024-09-09 MED ORDER — METRONIDAZOLE 500 MG/100ML IV SOLN
500 | Freq: Three times a day (TID) | INTRAVENOUS | Status: DC
Start: 2024-09-09 — End: 2024-09-12
  Administered 2024-09-10 – 2024-09-12 (×8): 500 mg via INTRAVENOUS

## 2024-09-09 MED ORDER — LEVOFLOXACIN IN D5W 750 MG/150ML IV SOLN
750 | INTRAVENOUS | Status: DC
Start: 2024-09-09 — End: 2024-09-09

## 2024-09-09 MED ORDER — POTASSIUM CHLORIDE CRYS ER 20 MEQ PO TBCR
20 | ORAL | Status: DC | PRN
Start: 2024-09-09 — End: 2024-09-12

## 2024-09-09 MED ORDER — MORPHINE SULFATE (PF) 4 MG/ML IJ SOLN
4 | INTRAMUSCULAR | Status: AC
Start: 2024-09-09 — End: 2024-09-09
  Administered 2024-09-09: 15:00:00 4 mg via INTRAVENOUS

## 2024-09-09 MED ORDER — ONDANSETRON HCL 4 MG/2ML IJ SOLN
4 | Freq: Once | INTRAMUSCULAR | Status: AC
Start: 2024-09-09 — End: 2024-09-09
  Administered 2024-09-09: 13:00:00 4 mg via INTRAVENOUS

## 2024-09-09 MED ORDER — SODIUM CHLORIDE 0.9 % IV BOLUS
0.9 | Freq: Once | INTRAVENOUS | Status: DC
Start: 2024-09-09 — End: 2024-09-12

## 2024-09-09 MED ORDER — MAGNESIUM SULFATE 2000 MG/50 ML IVPB PREMIX
2 | INTRAVENOUS | Status: DC | PRN
Start: 2024-09-09 — End: 2024-09-12

## 2024-09-09 MED ORDER — NORMAL SALINE FLUSH 0.9 % IV SOLN
0.9 | Freq: Two times a day (BID) | INTRAVENOUS | Status: DC
Start: 2024-09-09 — End: 2024-09-12
  Administered 2024-09-10 – 2024-09-12 (×5): 10 mL via INTRAVENOUS

## 2024-09-09 MED ORDER — ENOXAPARIN SODIUM 40 MG/0.4ML IJ SOSY
40 | Freq: Every day | INTRAMUSCULAR | Status: DC
Start: 2024-09-09 — End: 2024-09-12
  Administered 2024-09-09 – 2024-09-12 (×4): 40 mg via SUBCUTANEOUS

## 2024-09-09 MED ORDER — IOPAMIDOL 76 % IV SOLN
76 | Freq: Once | INTRAVENOUS | Status: AC | PRN
Start: 2024-09-09 — End: 2024-09-09
  Administered 2024-09-09: 16:00:00 100 mL via INTRAVENOUS

## 2024-09-09 MED ORDER — POLYETHYLENE GLYCOL 3350 17 G PO PACK
17 | Freq: Every day | ORAL | Status: DC | PRN
Start: 2024-09-09 — End: 2024-09-12

## 2024-09-09 MED FILL — ENOXAPARIN SODIUM 40 MG/0.4ML IJ SOSY: 40 MG/0.4ML | INTRAMUSCULAR | Qty: 0.4 | Fill #0

## 2024-09-09 MED FILL — ISOVUE-370 76 % IV SOLN: 76 % | INTRAVENOUS | Qty: 100 | Fill #0

## 2024-09-09 MED FILL — KETOROLAC TROMETHAMINE 15 MG/ML IJ SOLN: 15 mg/mL | INTRAMUSCULAR | Qty: 1 | Fill #0

## 2024-09-09 MED FILL — DILAUDID 1 MG/ML IJ SOLN: 1 mg/mL | INTRAMUSCULAR | Qty: 1 | Fill #0

## 2024-09-09 MED FILL — LEVOFLOXACIN IN D5W 750 MG/150ML IV SOLN: 750 MG/150ML | INTRAVENOUS | Qty: 150 | Fill #0

## 2024-09-09 MED FILL — SODIUM CHLORIDE 0.9 % IV SOLN: 0.9 % | INTRAVENOUS | Qty: 1000 | Fill #0

## 2024-09-09 MED FILL — MORPHINE SULFATE 4 MG/ML IJ SOLN: 4 mg/mL | INTRAMUSCULAR | Qty: 1 | Fill #0

## 2024-09-09 MED FILL — PANTOPRAZOLE SODIUM 40 MG IV SOLR: 40 mg | INTRAVENOUS | Qty: 40 | Fill #0

## 2024-09-09 MED FILL — CEFEPIME HCL 2 G IV SOLR: 2 g | INTRAVENOUS | Qty: 2 | Fill #0

## 2024-09-09 MED FILL — ONDANSETRON HCL 4 MG/2ML IJ SOLN: 4 MG/2ML | INTRAMUSCULAR | Qty: 2 | Fill #0

## 2024-09-09 MED FILL — METRONIDAZOLE 500 MG/100ML IV SOLN: 500 MG/100ML | INTRAVENOUS | Qty: 100 | Fill #0

## 2024-09-09 MED FILL — NORMAL SALINE FLUSH 0.9 % IV SOLN: 0.9 % | INTRAVENOUS | Qty: 40 | Fill #0

## 2024-09-09 MED FILL — SODIUM CHLORIDE 0.45 % IV SOLN: 0.45 % | INTRAVENOUS | Qty: 1000 | Fill #0

## 2024-09-09 NOTE — Progress Notes (Signed)
 Yolanda Weber is a 53 y.o. female who presents today for the following:  Chief Complaint   Patient presents with    New Patient     States was referred per ED physician for diverticulitis.  Has been seen in the ED 3 times in the past 4 months for this.  Unable to assess patient due to her falling asleep each time I ask her a question.  Did not know medications she was currently on.         Allergies   Allergen Reactions    Shellfish Allergy Hives    Ibuprofen Nausea And Vomiting       No current facility-administered medications for this visit.     Current Outpatient Medications   Medication Sig Dispense Refill    polyethylene glycol (GLYCOLAX ) 17 GM/SCOOP powder Use as directed by physician. 510 g 0    pantoprazole  (PROTONIX ) 40 MG tablet Take 1 tablet by mouth 2 times daily (with meals) 30 tablet 3    meloxicam  (MOBIC ) 15 MG tablet Take 1 tablet by mouth daily      clonazePAM  (KLONOPIN ) 1 MG tablet Take 1 tablet by mouth 2 times daily.       Facility-Administered Medications Ordered in Other Visits   Medication Dose Route Frequency Provider Last Rate Last Admin    ceFEPIme  (MAXIPIME ) 2,000 mg in sodium chloride  0.9 % 100 mL IVPB (addEASE)  2,000 mg IntraVENous NOW Claudene Helene BIRCH, MD        metroNIDAZOLE  (FLAGYL ) 500 mg in 0.9% NaCl 100 mL IVPB premix  500 mg IntraVENous NOW Claudene Helene BIRCH, MD 100 mL/hr at 09/09/24 1219 500 mg at 09/09/24 1219    sodium chloride  0.9 % bolus 1,000 mL  1,000 mL IntraVENous Once Claudene Helene BIRCH, MD           Past Medical History:   Diagnosis Date    Angiomyolipoma of both kidneys     Bulging lumbar disc     Diverticulosis     Fibromyalgia     Sciatica     Sickle cell trait        Past Surgical History:   Procedure Laterality Date    CHOLECYSTECTOMY      CYST REMOVAL Right     Breast    DILATION AND CURETTAGE OF UTERUS         No family history on file.    Social History     Socioeconomic History    Marital status: Single     Spouse name: Not on file    Number of children: Not on file     Years of education: Not on file    Highest education level: Not on file   Occupational History    Not on file   Tobacco Use    Smoking status: Some Days     Types: Cigarettes     Passive exposure: Never    Smokeless tobacco: Never   Vaping Use    Vaping status: Never Used   Substance and Sexual Activity    Alcohol use: Not Currently    Drug use: Never    Sexual activity: Not on file   Other Topics Concern    Not on file   Social History Narrative    Not on file     Social Drivers of Health     Financial Resource Strain: Not on file   Food Insecurity: No Food Insecurity (07/14/2024)    Hunger Vital Sign  Worried About Programme researcher, broadcasting/film/video in the Last Year: Never true     Ran Out of Food in the Last Year: Never true   Transportation Needs: No Transportation Needs (07/14/2024)    PRAPARE - Therapist, art (Medical): No     Lack of Transportation (Non-Medical): No   Physical Activity: Not on file   Stress: Not on file   Social Connections: Unknown (06/12/2023)    Received from [employer]     How often do you feel lonely or isolated from those around you? (Adult - for ages 40 years and over): Not on file   Intimate Partner Violence: Not on file   Housing Stability: Low Risk  (07/14/2024)    Housing Stability Vital Sign     Unable to Pay for Housing in the Last Year: No     Number of Times Moved in the Last Year: 1     Homeless in the Last Year: No   Recent Concern: Housing Stability - High Risk (05/09/2024)    Housing Stability Vital Sign     Unable to Pay for Housing in the Last Year: No     Number of Times Moved in the Last Year: 2     Homeless in the Last Year: No         53 year old female with history of gastroesophageal reflux disease, peptic ulcer disease, sciatica, fibromyalgia, lumbar disc disease, diverticular disease, sickle cell trait, and angiomyolipoma of the kidneys who comes in for evaluation of the diverticular disease peptic ulcer disease and GERD.  Patient  states when she eats it causes nausea which leads to abdominal pain which then leads to diarrhea.  This is a recent problem.  She therefore is not even met secondary to the fear of those symptoms.  She has some nausea and vomiting.  Her bowel movements intermittently loose.  No gross GI bleeding.  Abdominal pain is located in the left lower quadrant and left flank.  She has been losing weight.  She states 1 year ago she was 215 pounds and she is 185 pounds today.  She had endoscopic workup while living in [place]  in the past.  No new medications prior to the start of symptoms          Review of Systems   Constitutional:  Negative for activity change.   HENT:  Negative for nosebleeds.    Respiratory: Negative.     Cardiovascular: Negative.    Gastrointestinal:  Positive for abdominal pain, diarrhea, nausea and vomiting. Negative for abdominal distention, anal bleeding, blood in stool, constipation and rectal pain.   Genitourinary: Negative.    Musculoskeletal:  Positive for arthralgias and back pain.   Skin: Negative.    Allergic/Immunologic: Negative.    Neurological: Negative.    Hematological: Negative.    Psychiatric/Behavioral: Negative.     All other systems reviewed and are negative.          BP 109/71 (BP Site: Right Upper Arm, Patient Position: Sitting, BP Cuff Size: Medium Adult)   Pulse 76   Temp 98.6 F (37 C) (Oral)   Resp 16   Ht 1.575 m (5' 2)   Wt 84.1 kg (185 lb 6 oz)   SpO2 96%   BMI 33.91 kg/m     Physical Exam  Vitals and nursing note reviewed.   Constitutional:       Appearance: Normal appearance. She is obese.  HENT:      Head: Normocephalic and atraumatic.      Nose: Nose normal.   Eyes:      General: No scleral icterus.  Cardiovascular:      Rate and Rhythm: Normal rate and regular rhythm.      Pulses: Normal pulses.      Heart sounds: Normal heart sounds.   Pulmonary:      Effort: Pulmonary effort is normal.      Breath sounds: Normal breath sounds.   Abdominal:       General: Abdomen is flat. There is no distension.      Palpations: Abdomen is soft. There is no mass.      Tenderness: There is abdominal tenderness. There is no right CVA tenderness, left CVA tenderness, guarding or rebound.      Hernia: No hernia is present.   Musculoskeletal:      Right lower leg: No edema.      Left lower leg: No edema.   Skin:     General: Skin is warm and dry.   Neurological:      General: No focal deficit present.      Mental Status: She is alert and oriented to person, place, and time. Mental status is at baseline.   Psychiatric:         Mood and Affect: Mood normal.         Behavior: Behavior normal.         Thought Content: Thought content normal.         Judgment: Judgment normal.          1. Left lower quadrant abdominal pain  We will schedule patient for colonoscopy further evaluate the cause of pain.  Must consider diverticular disease secondary to the location.  - COLONOSCOPY,DIAGNOSTIC  - polyethylene glycol (GLYCOLAX ) 17 GM/SCOOP powder; Use as directed by physician.  Dispense: 510 g; Refill: 0    2. Nausea and vomiting, unspecified vomiting type  Schedule upper endoscopy to evaluate the nausea and vomiting and abdominal pain.  - UPPER GI ENDOSCOPY,DIAGNOSIS    3. Diarrhea, unspecified type  Evaluate the cause of the diarrhea with colonoscopy.  - COLONOSCOPY,DIAGNOSTIC  - polyethylene glycol (GLYCOLAX ) 17 GM/SCOOP powder; Use as directed by physician.  Dispense: 510 g; Refill: 0       Bryann Mcnealy Myriam Raddle, MD

## 2024-09-09 NOTE — Progress Notes (Signed)
 PT. Awake in bed  ivf patent infusing. Call bell within reach.

## 2024-09-09 NOTE — Progress Notes (Signed)
 LPN assessment reviewed.

## 2024-09-09 NOTE — H&P (View-Only) (Signed)
 Yolanda Weber is a 53 y.o. female who presents today for the following:  Chief Complaint   Patient presents with    New Patient     States was referred per ED physician for diverticulitis.  Has been seen in the ED 3 times in the past 4 months for this.  Unable to assess patient due to her falling asleep each time I ask her a question.  Did not know medications she was currently on.         Allergies   Allergen Reactions    Shellfish Allergy Hives    Ibuprofen Nausea And Vomiting       No current facility-administered medications for this visit.     Current Outpatient Medications   Medication Sig Dispense Refill    polyethylene glycol (GLYCOLAX ) 17 GM/SCOOP powder Use as directed by physician. 510 g 0    pantoprazole  (PROTONIX ) 40 MG tablet Take 1 tablet by mouth 2 times daily (with meals) 30 tablet 3    meloxicam  (MOBIC ) 15 MG tablet Take 1 tablet by mouth daily      clonazePAM  (KLONOPIN ) 1 MG tablet Take 1 tablet by mouth 2 times daily.       Facility-Administered Medications Ordered in Other Visits   Medication Dose Route Frequency Provider Last Rate Last Admin    ceFEPIme  (MAXIPIME ) 2,000 mg in sodium chloride  0.9 % 100 mL IVPB (addEASE)  2,000 mg IntraVENous NOW Claudene Helene BIRCH, MD        metroNIDAZOLE  (FLAGYL ) 500 mg in 0.9% NaCl 100 mL IVPB premix  500 mg IntraVENous NOW Claudene Helene BIRCH, MD 100 mL/hr at 09/09/24 1219 500 mg at 09/09/24 1219    sodium chloride  0.9 % bolus 1,000 mL  1,000 mL IntraVENous Once Claudene Helene BIRCH, MD           Past Medical History:   Diagnosis Date    Angiomyolipoma of both kidneys     Bulging lumbar disc     Diverticulosis     Fibromyalgia     Sciatica     Sickle cell trait        Past Surgical History:   Procedure Laterality Date    CHOLECYSTECTOMY      CYST REMOVAL Right     Breast    DILATION AND CURETTAGE OF UTERUS         No family history on file.    Social History     Socioeconomic History    Marital status: Single     Spouse name: Not on file    Number of children: Not on file     Years of education: Not on file    Highest education level: Not on file   Occupational History    Not on file   Tobacco Use    Smoking status: Some Days     Types: Cigarettes     Passive exposure: Never    Smokeless tobacco: Never   Vaping Use    Vaping status: Never Used   Substance and Sexual Activity    Alcohol use: Not Currently    Drug use: Never    Sexual activity: Not on file   Other Topics Concern    Not on file   Social History Narrative    Not on file     Social Drivers of Health     Financial Resource Strain: Not on file   Food Insecurity: No Food Insecurity (07/14/2024)    Hunger Vital Sign  Worried About Programme researcher, broadcasting/film/video in the Last Year: Never true     Ran Out of Food in the Last Year: Never true   Transportation Needs: No Transportation Needs (07/14/2024)    PRAPARE - Therapist, art (Medical): No     Lack of Transportation (Non-Medical): No   Physical Activity: Not on file   Stress: Not on file   Social Connections: Unknown (06/12/2023)    Received from [employer]     How often do you feel lonely or isolated from those around you? (Adult - for ages 40 years and over): Not on file   Intimate Partner Violence: Not on file   Housing Stability: Low Risk  (07/14/2024)    Housing Stability Vital Sign     Unable to Pay for Housing in the Last Year: No     Number of Times Moved in the Last Year: 1     Homeless in the Last Year: No   Recent Concern: Housing Stability - High Risk (05/09/2024)    Housing Stability Vital Sign     Unable to Pay for Housing in the Last Year: No     Number of Times Moved in the Last Year: 2     Homeless in the Last Year: No         53 year old female with history of gastroesophageal reflux disease, peptic ulcer disease, sciatica, fibromyalgia, lumbar disc disease, diverticular disease, sickle cell trait, and angiomyolipoma of the kidneys who comes in for evaluation of the diverticular disease peptic ulcer disease and GERD.  Patient  states when she eats it causes nausea which leads to abdominal pain which then leads to diarrhea.  This is a recent problem.  She therefore is not even met secondary to the fear of those symptoms.  She has some nausea and vomiting.  Her bowel movements intermittently loose.  No gross GI bleeding.  Abdominal pain is located in the left lower quadrant and left flank.  She has been losing weight.  She states 1 year ago she was 215 pounds and she is 185 pounds today.  She had endoscopic workup while living in [place]  in the past.  No new medications prior to the start of symptoms          Review of Systems   Constitutional:  Negative for activity change.   HENT:  Negative for nosebleeds.    Respiratory: Negative.     Cardiovascular: Negative.    Gastrointestinal:  Positive for abdominal pain, diarrhea, nausea and vomiting. Negative for abdominal distention, anal bleeding, blood in stool, constipation and rectal pain.   Genitourinary: Negative.    Musculoskeletal:  Positive for arthralgias and back pain.   Skin: Negative.    Allergic/Immunologic: Negative.    Neurological: Negative.    Hematological: Negative.    Psychiatric/Behavioral: Negative.     All other systems reviewed and are negative.          BP 109/71 (BP Site: Right Upper Arm, Patient Position: Sitting, BP Cuff Size: Medium Adult)   Pulse 76   Temp 98.6 F (37 C) (Oral)   Resp 16   Ht 1.575 m (5' 2)   Wt 84.1 kg (185 lb 6 oz)   SpO2 96%   BMI 33.91 kg/m     Physical Exam  Vitals and nursing note reviewed.   Constitutional:       Appearance: Normal appearance. She is obese.  HENT:      Head: Normocephalic and atraumatic.      Nose: Nose normal.   Eyes:      General: No scleral icterus.  Cardiovascular:      Rate and Rhythm: Normal rate and regular rhythm.      Pulses: Normal pulses.      Heart sounds: Normal heart sounds.   Pulmonary:      Effort: Pulmonary effort is normal.      Breath sounds: Normal breath sounds.   Abdominal:       General: Abdomen is flat. There is no distension.      Palpations: Abdomen is soft. There is no mass.      Tenderness: There is abdominal tenderness. There is no right CVA tenderness, left CVA tenderness, guarding or rebound.      Hernia: No hernia is present.   Musculoskeletal:      Right lower leg: No edema.      Left lower leg: No edema.   Skin:     General: Skin is warm and dry.   Neurological:      General: No focal deficit present.      Mental Status: She is alert and oriented to person, place, and time. Mental status is at baseline.   Psychiatric:         Mood and Affect: Mood normal.         Behavior: Behavior normal.         Thought Content: Thought content normal.         Judgment: Judgment normal.          1. Left lower quadrant abdominal pain  We will schedule patient for colonoscopy further evaluate the cause of pain.  Must consider diverticular disease secondary to the location.  - COLONOSCOPY,DIAGNOSTIC  - polyethylene glycol (GLYCOLAX ) 17 GM/SCOOP powder; Use as directed by physician.  Dispense: 510 g; Refill: 0    2. Nausea and vomiting, unspecified vomiting type  Schedule upper endoscopy to evaluate the nausea and vomiting and abdominal pain.  - UPPER GI ENDOSCOPY,DIAGNOSIS    3. Diarrhea, unspecified type  Evaluate the cause of the diarrhea with colonoscopy.  - COLONOSCOPY,DIAGNOSTIC  - polyethylene glycol (GLYCOLAX ) 17 GM/SCOOP powder; Use as directed by physician.  Dispense: 510 g; Refill: 0       Bryann Mcnealy Myriam Raddle, MD

## 2024-09-09 NOTE — ED Provider Notes (Signed)
 [facility]  EMERGENCY DEPARTMENT  EMERGENCY DEPARTMENT ENCOUNTER      Pt Name: Yolanda Weber  MRN: [id number]  Birthdate Oct 08, 1971  Date of evaluation: 09/09/2024  Provider: Helene Sharps, MD    CHIEF COMPLAINT       Chief Complaint   Patient presents with    Vomiting    Diarrhea    Dehydration         HISTORY OF PRESENT ILLNESS   (Location/Symptom, Timing/Onset, Context/Setting, Quality, Duration, Modifying Factors, Severity)  Note limiting factors.   Yolanda Weber is a 53 y.o. female who presents to the emergency department with illness which began 3 days ago marked by diarrhea vomiting decreased p.o. intake most recently unable to keep down clear fluids also with left lower quadrant abdominal pain.  Patient has had 2 episodes of diverticulitis in the last 3 months in July and August this feels similar.  No fever no GI blood loss    HPI    Nursing Notes were reviewed.    REVIEW OF SYSTEMS    (2-9 systems for level 4, 10 or more for level 5)     Review of Systems   All other systems reviewed and are negative.      Except as noted above the remainder of the review of systems was reviewed and negative.       PAST MEDICAL HISTORY     Past Medical History:   Diagnosis Date    Angiomyolipoma of both kidneys     Bulging lumbar disc     Diverticulosis     Fibromyalgia     Sciatica     Sickle cell trait          SURGICAL HISTORY       Past Surgical History:   Procedure Laterality Date    CHOLECYSTECTOMY      CYST REMOVAL Right     Breast    DILATION AND CURETTAGE OF UTERUS           CURRENT MEDICATIONS       Previous Medications    CLONAZEPAM  (KLONOPIN ) 1 MG TABLET    Take 1 tablet by mouth 2 times daily.    MELOXICAM  (MOBIC ) 15 MG TABLET    Take 1 tablet by mouth daily    PANTOPRAZOLE  (PROTONIX ) 40 MG TABLET    Take 1 tablet by mouth 2 times daily (with meals)    POLYETHYLENE GLYCOL (GLYCOLAX ) 17 GM/SCOOP POWDER    Use as directed by physician.       ALLERGIES     Shellfish allergy and Ibuprofen    FAMILY HISTORY      History reviewed. No pertinent family history.       SOCIAL HISTORY       Social History     Socioeconomic History    Marital status: Single     Spouse name: None    Number of children: None    Years of education: None    Highest education level: None   Tobacco Use    Smoking status: Some Days     Types: Cigarettes     Passive exposure: Never    Smokeless tobacco: Never   Vaping Use    Vaping status: Never Used   Substance and Sexual Activity    Alcohol use: Not Currently    Drug use: Never     Social Drivers of Health     Food Insecurity: No Food Insecurity (07/14/2024)    Hunger Vital Sign  Worried About Programme researcher, broadcasting/film/video in the Last Year: Never true     Ran Out of Food in the Last Year: Never true   Transportation Needs: No Transportation Needs (07/14/2024)    PRAPARE - Therapist, art (Medical): No     Lack of Transportation (Non-Medical): No    Received from [employer] Stability: Low Risk  (07/14/2024)    Housing Stability Vital Sign     Unable to Pay for Housing in the Last Year: No     Number of Times Moved in the Last Year: 1     Homeless in the Last Year: No   Recent Concern: Housing Stability - High Risk (05/09/2024)    Housing Stability Vital Sign     Unable to Pay for Housing in the Last Year: No     Number of Times Moved in the Last Year: 2     Homeless in the Last Year: No       SCREENINGS         Glasgow Coma Scale  Eye Opening: Spontaneous  Best Verbal Response: Oriented  Best Motor Response: Obeys commands  Glasgow Coma Scale Score: 15                                       PHYSICAL EXAM    (up to 7 for level 4, 8 or more for level 5)     ED Triage Vitals [09/09/24 0846]   BP Girls Systolic BP Percentile Girls Diastolic BP Percentile Boys Systolic BP Percentile Boys Diastolic BP Percentile Temp Temp Source Pulse   -- -- -- -- -- 97.9 F (36.6 C) Oral --      Respirations SpO2 Height Weight - Scale       16 -- 1.651 m (5' 5) 82.6 kg (182  lb)         Middle-age AA female appears mildly dehydrated  Physical Exam  Vitals and nursing note reviewed.   Constitutional:       General: She is not in acute distress.     Appearance: She is not ill-appearing or toxic-appearing.   HENT:      Head: Normocephalic and atraumatic.      Nose: Nose normal.      Mouth/Throat:      Mouth: Mucous membranes are dry.      Pharynx: No oropharyngeal exudate or posterior oropharyngeal erythema.   Eyes:      Extraocular Movements: Extraocular movements intact.      Conjunctiva/sclera: Conjunctivae normal.   Cardiovascular:      Rate and Rhythm: Normal rate and regular rhythm.      Pulses: Normal pulses.      Heart sounds: Normal heart sounds. No murmur heard.  Pulmonary:      Effort: Pulmonary effort is normal. No respiratory distress.      Breath sounds: Normal breath sounds. No wheezing, rhonchi or rales.   Abdominal:      General: Abdomen is flat. Bowel sounds are normal. There is no distension.      Tenderness: There is abdominal tenderness. There is no right CVA tenderness, left CVA tenderness, guarding or rebound.      Hernia: No hernia is present.      Comments: Left lower quadrant tenderness without guarding or rebound   Musculoskeletal:  General: No swelling, tenderness, deformity or signs of injury. Normal range of motion.      Cervical back: Normal range of motion and neck supple. No rigidity or tenderness.      Right lower leg: No edema.      Left lower leg: No edema.   Skin:     General: Skin is warm and dry.      Capillary Refill: Capillary refill takes less than 2 seconds.      Findings: No erythema or rash.   Neurological:      General: No focal deficit present.      Mental Status: She is alert and oriented to person, place, and time.      Cranial Nerves: No cranial nerve deficit.      Sensory: No sensory deficit.      Motor: No weakness.      Coordination: Coordination normal.      Gait: Gait normal.   Psychiatric:         Mood and Affect: Mood normal.          Behavior: Behavior normal.         Thought Content: Thought content normal.         DIAGNOSTIC RESULTS     EKG: All EKG's are interpreted by the Emergency Department Physician who either signs or Co-signs this chart in the absence of a cardiologist.  EKG performed at 0841 shows a sinus rhythm at 83 left atrial enlargement normal intervals normal axis no STEMI no significant ST-T change    RADIOLOGY:   Non-plain film images such as CT, Ultrasound and MRI are read by the radiologist. Plain radiographic images are visualized and preliminarily interpreted by the emergency physician with the below findings:        Interpretation per the Radiologist below, if available at the time of this note:    No orders to display         ED BEDSIDE ULTRASOUND:   Performed by ED Physician - none    LABS:  Labs Reviewed   TROPONIN   CBC WITH AUTO DIFFERENTIAL   COMPREHENSIVE METABOLIC PANEL   LIPASE       All other labs were within normal range or not returned as of this dictation.    EMERGENCY DEPARTMENT COURSE and DIFFERENTIAL DIAGNOSIS/MDM:   Vitals:    Vitals:    09/09/24 0846   Resp: 16   Temp: 97.9 F (36.6 C)   TempSrc: Oral   Weight: 82.6 kg (182 lb)   Height: 1.651 m (5' 5)           Medical Decision Making  Amount and/or Complexity of Data Reviewed  Labs: ordered.  Radiology: ordered.  ECG/medicine tests: ordered.    Risk  Prescription drug management.  Decision regarding hospitalization.    Leukocytosis without fever.  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  CT with contrast shows extensive diverticuli without inflammation.  Started on standard antibiotics requiring pain medication will discuss with hospitalist regarding admission.      Some improvement with IV fluids though still not urinated ordered second liter of fluids abdominal pain and nausea improved with meds.    1400: Dr. Avi will admit      REASSESSMENT          CRITICAL  CARE TIME   Total Critical Care time was      minutes, excluding separately reportable procedures.  There was a high probability of clinically significant/life threatening deterioration in the patient's condition which required my urgent intervention.   CONSULTS:  None    PROCEDURES:  Unless otherwise noted below, none     Procedures    FINAL IMPRESSION    No diagnosis found.      DISPOSITION/PLAN   DISPOSITION                 PATIENT REFERRED TO:  No follow-up provider specified.    DISCHARGE MEDICATIONS:  New Prescriptions    No medications on file     Controlled Substances Monitoring:          No data to display                (Please note that portions of this note were completed with a voice recognition program.  Efforts were made to edit the dictations but occasionally words are mis-transcribed.)    Helene Sharps, MD (electronically signed)  Attending Emergency Physician           Sharps Helene BIRCH, MD  09/09/24 [phone]

## 2024-09-09 NOTE — Plan of Care (Signed)
 Problem: ABCDS Injury Assessment  Goal: Absence of physical injury  Outcome: Progressing  Flowsheets (Taken 09/09/2024 1529)  Absence of Physical Injury: Implement safety measures based on patient assessment

## 2024-09-09 NOTE — ED Triage Notes (Signed)
 C/o vomiting, diarrhea and vomiting with weakness

## 2024-09-09 NOTE — Plan of Care (Signed)
 Problem: ABCDS Injury Assessment  Goal: Absence of physical injury  09/09/2024 2328 by Elaine Doffing, RN  Outcome: Progressing  Flowsheets (Taken 09/09/2024 1950)  Absence of Physical Injury: Implement safety measures based on patient assessment  09/09/2024 1529 by Delores Gabriel BIRCH, LPN  Outcome: Progressing  Flowsheets (Taken 09/09/2024 1529)  Absence of Physical Injury: Implement safety measures based on patient assessment     Problem: Pain  Goal: Verbalizes/displays adequate comfort level or baseline comfort level  Outcome: Progressing  Flowsheets (Taken 09/09/2024 1945)  Verbalizes/displays adequate comfort level or baseline comfort level: Encourage patient to monitor pain and request assistance

## 2024-09-09 NOTE — Progress Notes (Signed)
 TRANSFER - IN REPORT:    Verbal report received from amanda warren on Margene Cherian  being received from ed for routine progression of patient care      Report consisted of patient's Situation, Background, Assessment and   Recommendations(SBAR).     Information from the following report(s) Nurse Handoff Report was reviewed with the receiving nurse.    Opportunity for questions and clarification was provided.      Assessment completed upon patient's arrival to unit and care assumed.

## 2024-09-09 NOTE — H&P (Signed)
 History and Physical  Dr Alverda Kaiser MD      Chief Complaints:     Chief Complaint   Patient presents with    Vomiting    Diarrhea    Dehydration         Subjective:     Yolanda Weber is a 53 y.o. female followed by [name], MD and  has a past medical history of Angiomyolipoma of both kidneys, Bulging lumbar disc, Diverticulosis, Fibromyalgia, Sciatica, and Sickle cell trait.    Presents recently in May and July and then again today with similar complaints.  She did follow-up recently with Dr. Myriam and has EGD and colonoscopy scheduled for October 1st.  Patient states that 3 days ago her stomach was feeling uneasy started having abdominal pain so try to decrease her food intake and try to eat only fruits and vegetables and small amounts.  She had been on chronic Protonix  and has ran out over the past few weeks.  She said 2 days ago she also started having right mouth pain and swelling and was placed on amoxicillin  which decreased and improved the pain.  She initially had nausea vomiting for the first day and then started with diarrhea where she said she had at least 7-8 loose stools but denied seeing any blood.  She was seen in the emergency room and blood pressure was 113/92 heart rate 74 afebrile 97.9 and O2 sat 97% on room air.  Initial labs show potassium 5.6 normal BUN/creatinine.  AST elevated at 63 and ALT at 64 alk phosphatase is 105 which previously was ALT which is noted to be in a normal range.  Mild leukocytosis of 14.  CT of the abdomen pelvis shows chronic hemangioma and the central liver status post cholecystectomy did note some post procedure no CBD dilation.  Multiple bilateral renal masses similar to prior study extensive diverticulosis of the colon without acute inflammation as well as uterine fibroids noted.  Was given cefepime  2 g x 1 as well as Flagyl  5 mg IV x 1 as well as Toradol  15 mg IV x 1 and morphine  4 mg IV x 2 doses and Zofran .  As well as a 1 L normal  saline.  Was referred for admission for recurrent acute diverticulitis    Discussed management with the ED provider Dr. Helene Sharps and and agree with the plan for hospitalization       Past Medical History:   Diagnosis Date    Angiomyolipoma of both kidneys     Bulging lumbar disc     Diverticulosis     Fibromyalgia     Sciatica     Sickle cell trait       Past Surgical History:   Procedure Laterality Date    CHOLECYSTECTOMY      CYST REMOVAL Right     Breast    DILATION AND CURETTAGE OF UTERUS       History reviewed. No pertinent family history.   Social History     Tobacco Use    Smoking status: Some Days     Types: Cigarettes     Passive exposure: Never    Smokeless tobacco: Never   Substance Use Topics    Alcohol use: Not Currently       Prior to Admission medications   Medication Sig Start Date End Date Taking? Authorizing Provider   polyethylene glycol (GLYCOLAX ) 17 GM/SCOOP powder Use as directed by physician. 08/28/24   Myriam Nivia Raddle., MD  pantoprazole  (PROTONIX ) 40 MG tablet Take 1 tablet by mouth 2 times daily (with meals) 05/12/24 07/11/24  Pooja Camuso R, MD   meloxicam  (MOBIC ) 15 MG tablet Take 1 tablet by mouth daily 04/29/24   [provider]   clonazePAM  (KLONOPIN ) 1 MG tablet Take 1 tablet by mouth 2 times daily.    Automatic Reconciliation, Ar     Allergies   Allergen Reactions    Shellfish Allergy Hives    Ibuprofen Nausea And Vomiting        Review of Systems:  Review of Systems   Constitutional: Negative.    HENT: Negative.     Eyes: Negative.    Respiratory: Negative.     Cardiovascular: Negative.    Gastrointestinal:  Positive for abdominal pain, nausea and vomiting.   Endocrine: Negative.    Genitourinary: Negative.    Musculoskeletal: Negative.    Skin: Negative.    Allergic/Immunologic: Negative.    Neurological: Negative.    Hematological: Negative.    Psychiatric/Behavioral: Negative.            Objective:     Vitals:  BP (!) 140/86   Pulse 57   Temp 97.2 F (36.2 C)  (Oral)   Resp 18   Ht 1.651 m (5' 5)   Wt 82.6 kg (182 lb)   SpO2 100%   BMI 30.29 kg/m     Physical Exam:  General: Alert, cooperative, no distress.  Head:  Normocephalic, without obvious abnormality, atraumatic.  Eyes:  Conjunctivae/corneas clear. Pupils equal, round, reactive to light. Extraocular movements intact.  Lungs:  Clear to auscultation bilaterally, no wheezes, crackles  Chest wall: No tenderness or deformity.  Heart:  Regular rate and rhythm, S1, S2 normal, no murmur, click, rub, or gallop.  Abdomen:   Soft, left lower quadrant tenderness, Positive Bowel sounds normal. No masses. No organomegaly.  No guarding or rebound  Back:  No spine tenderness to palpation  Extremities: Extremities normal, atraumatic, no cyanosis or edema.  Pulses: Symmetric all extremities.  Skin: Skin color, texture, turgor normal.   Lymph nodes: Cervical nodes normal.  Neurologic: CNII-XII intact. Normal strength, sensation, and reflexes throughout.      Labs:  Recent Results (from the past 24 hours)   EKG 12 Lead    Collection Time: 09/09/24  8:41 AM   Result Value Ref Range    Ventricular Rate 83 BPM    Atrial Rate 82 BPM    P-R Interval 126 ms    QRS Duration 82 ms    Q-T Interval 402 ms    QTc Calculation (Bazett) 473 ms    P Axis 46 degrees    R Axis 38 degrees    T Axis 48 degrees    Diagnosis Sinus rhythm  Left atrial enlargement      Troponin    Collection Time: 09/09/24  9:04 AM   Result Value Ref Range    Troponin T <6.0 0 - 14 ng/L   CBC with Auto Differential    Collection Time: 09/09/24  9:04 AM   Result Value Ref Range    WBC 14.0 (H) 3.6 - 11.0 K/uL    RBC 4.65 3.80 - 5.20 M/uL    Hemoglobin 13.2 11.5 - 16.0 g/dL    Hematocrit 61.9 64.9 - 47.0 %    MCV 81.7 80.0 - 99.0 FL    MCH 28.4 26.0 - 34.0 PG    MCHC 34.7 30.0 - 36.5 g/dL    RDW 85.1 (  H) 11.5 - 14.5 %    Platelets 403 (H) 150 - 400 K/uL    MPV 9.8 8.9 - 12.9 FL    Nucleated RBCs 0.0 0.0 PER 100 WBC    nRBC 0.00 0.00 - 0.01 K/uL    Neutrophils % 60.1  32.0 - 75.0 %    Lymphocytes % 28.8 12.0 - 49.0 %    Monocytes % 8.9 5.0 - 13.0 %    Eosinophils % 1.4 0.0 - 7.0 %    Basophils % 0.4 0.0 - 1.0 %    Immature Granulocytes % 0.4 0 - 0.5 %    Neutrophils Absolute 8.41 (H) 1.80 - 8.00 K/UL    Lymphocytes Absolute 4.03 (H) 0.80 - 3.50 K/UL    Monocytes Absolute 1.24 (H) 0.00 - 1.00 K/UL    Eosinophils Absolute 0.20 0.00 - 0.40 K/UL    Basophils Absolute 0.06 0.00 - 0.10 K/UL    Immature Granulocytes Absolute 0.06 (H) 0.00 - 0.04 K/UL    Differential Type AUTOMATED     Comprehensive Metabolic Panel    Collection Time: 09/09/24  9:04 AM   Result Value Ref Range    Sodium 140 136 - 145 mmol/L    Potassium 5.6 (H) 3.5 - 5.1 mmol/L    Chloride 102 98 - 107 mmol/L    CO2 16 (L) 20 - 29 mmol/L    Anion Gap 22 (H) 2 - 14 mmol/L    Glucose 109 (H) 65 - 100 mg/dL    BUN 19 6 - 20 mg/dL    Creatinine 9.29 9.39 - 1.00 mg/dL    BUN/Creatinine Ratio 28 (H) 12 - 20      Est, Glom Filt Rate >90 >59 ml/min/1.59m2    Calcium 9.8 8.6 - 10.0 mg/dL    Total Bilirubin 0.5 0.0 - 1.2 mg/dL    AST 63 (H) 10 - 35 U/L    ALT 64 (H) 10 - 35 U/L    Alk Phosphatase 105 (H) 35 - 104 U/L    Total Protein 8.0 6.4 - 8.3 g/dL    Albumin 3.7 3.5 - 5.2 g/dL    Globulin 4.3 (H) 2.0 - 4.0 g/dL    Albumin/Globulin Ratio 0.8 (L) 1.1 - 2.2     Lipase    Collection Time: 09/09/24  9:04 AM   Result Value Ref Range    Lipase 24 13 - 60 U/L       Imaging:  CT ABDOMEN PELVIS W IV CONTRAST Additional Contrast? None  Result Date: 09/09/2024  1. No acute abdominal or pelvic pathology. 2. Extensive sigmoid diverticulitis without acute inflammation. 3. Liver hemangiomas and multiple bilateral renal AML lesions. 4. Uterine fibroid. Electronically signed by Oneil Fireman, MD       Assessment & Plan:     Hospital Problems           Last Modified POA    * (Principal) Acute diverticulitis 09/09/2024 Yes    Diverticulitis 09/09/2024 Yes    Leukocytosis 09/09/2024 Yes    Hyperkalemia 09/09/2024 Yes    Elevated LFTs 09/09/2024 Yes       Acute Recurrent Sigmoid Diverticulitis   - Presents with similar 2 to 3 days of abdominal pain and then leading to nausea vomiting diarrhea  -CT shows extensive sigmoid diverticulosis but no obvious inflammation but with noted leukocytosis discussed with GI that most likely patient having another acute flare  -Has scheduled outpatient EGD and colonoscopy on October 1st  - GI consultation  -  Given cefepime  and Flagyl  in the emergency room continue on Flagyl  every 8 hours and Levaquin  daily  -4 mg of IV morphine  given in the emergency room x 2 and continue Dilaudid  1 mg every 4 hours as needed for severe pain  - Noted leukocytosis  - Stool for enteric bacteria  - Probiotic daily  -Continue current IV fluid  -Clear liquid diet advance depending on pain     Hyperkalemia   - 5.6 on admission and continue current IV fluids  -Monitor on telemetry  -Follow repeat labs     Leukocytosis   - 14 on admission secondary to diverticulitis   -Follow-up procalcitonin level  - Discussed with GI recommend daily Levaquin  and Flagyl  5 mg every 8 hours  -Monitor daily CBC      Tobacco dependence   -Noted to have smoke about 5 to 6 cigarettes a day  - Encourage cessation given nicotine  patch 14 mg change daily  - patient wishes for patch as she feels she will get very anxious      Fibromyalgia   - Chronic noted to be using only Tylenol   - continue home meds trazadone at night and Klonapin 1mg  oral bid prn      DVT prophylaxis   - Lovenox       Code Status : Full Code   Designates Dallas Lander as medical contact    Discussion/MDM: Patient with multiple medical comorbidities, each with high likelihood for morbidity and mortality if left untreated.   I have reviewed patient's presenting subjective and objective findings, as well as all laboratory studies, imaging studies, and vital signs to date as well as treatment rendered and patient's response to those treatments.  In addition, prior medical, surgical and relevant social and family  histories were reviewed. I have discussed management plan with patient/family and with nursing staff.      Toxic drug monitoring:    This is dictation was done by dragon, Advertising account planner. Quite often unanticipated grammatical, syntax, homophones and other interpretive errors or inadvertently transcribed by the computer software. Please excuse errors that have escaped final proofreading. Thank you.       Spent 75 minutes evaluating to coordinate patient's admission to remote telemetry and expecting 1 to 2 days of acute care stay    Electronically signed by Shauntelle Jamerson R Masson Nalepa, MD on 09/09/2024 at 3:22 PM

## 2024-09-10 LAB — URINALYSIS WITH REFLEX TO CULTURE
Bilirubin, Urine: NEGATIVE
Glucose, Ur: NEGATIVE mg/dL
Ketones, Urine: NEGATIVE mg/dL
Leukocyte Esterase, Urine: NEGATIVE
Nitrite, Urine: NEGATIVE
Protein, UA: NEGATIVE mg/dL
Specific Gravity, UA: <1.005 (ref 1.003–1.030)
Urobilinogen, Urine: 0.2 EU/dL (ref 0.2–1.0)
pH, Urine: 6.5 (ref 5.0–8.0)

## 2024-09-10 LAB — CBC WITH AUTO DIFFERENTIAL
Basophils %: 0.2 % (ref 0.0–1.0)
Basophils Absolute: 0.02 K/UL (ref 0.00–0.10)
Eosinophils %: 2 % (ref 0.0–7.0)
Eosinophils Absolute: 0.19 K/UL (ref 0.00–0.40)
Hematocrit: 34.4 % — ABNORMAL LOW (ref 35.0–47.0)
Hemoglobin: 11.8 g/dL (ref 11.5–16.0)
Immature Granulocytes %: 0.3 % (ref 0–0.5)
Immature Granulocytes Absolute: 0.03 K/UL (ref 0.00–0.04)
Lymphocytes %: 46.9 % (ref 12.0–49.0)
Lymphocytes Absolute: 4.36 K/UL — ABNORMAL HIGH (ref 0.80–3.50)
MCH: 28.4 pg (ref 26.0–34.0)
MCHC: 34.3 g/dL (ref 30.0–36.5)
MCV: 82.7 FL (ref 80.0–99.0)
MPV: 9.4 FL (ref 8.9–12.9)
Monocytes %: 7.8 % (ref 5.0–13.0)
Monocytes Absolute: 0.72 K/UL (ref 0.00–1.00)
Neutrophils %: 42.8 % (ref 32.0–75.0)
Neutrophils Absolute: 3.97 K/UL (ref 1.80–8.00)
Nucleated RBCs: 0 /100{WBCs}
Platelets: 432 K/uL — ABNORMAL HIGH (ref 150–400)
RBC: 4.16 M/uL (ref 3.80–5.20)
RDW: 14.5 % (ref 11.5–14.5)
WBC: 9.3 K/uL (ref 3.6–11.0)
nRBC: 0 K/uL (ref 0.00–0.01)

## 2024-09-10 LAB — EKG 12-LEAD
Atrial Rate: 82 {beats}/min
P Axis: 46 degrees
P-R Interval: 126 ms
Q-T Interval: 402 ms
QRS Duration: 82 ms
QTc Calculation (Bazett): 473 ms
R Axis: 38 degrees
T Axis: 48 degrees
Ventricular Rate: 83 {beats}/min

## 2024-09-10 LAB — COMPREHENSIVE METABOLIC PANEL
ALT: 41 U/L — ABNORMAL HIGH (ref 10–35)
AST: 16 U/L (ref 10–35)
Albumin/Globulin Ratio: 0.8 — ABNORMAL LOW (ref 1.1–2.2)
Albumin: 2.8 g/dL — ABNORMAL LOW (ref 3.5–5.2)
Alk Phosphatase: 89 U/L (ref 35–104)
Anion Gap: 11 mmol/L (ref 2–14)
BUN/Creatinine Ratio: 19 (ref 12–20)
BUN: 10 mg/dL (ref 6–20)
CO2: 25 mmol/L (ref 20–29)
Calcium: 9 mg/dL (ref 8.6–10.0)
Chloride: 106 mmol/L (ref 98–107)
Creatinine: 0.54 mg/dL — ABNORMAL LOW (ref 0.60–1.00)
Est, Glom Filt Rate: >90 ml/min/1.73m2 (ref >59)
Globulin: 3.4 g/dL (ref 2.0–4.0)
Glucose: 84 mg/dL (ref 65–100)
Potassium: 3.3 mmol/L — ABNORMAL LOW (ref 3.5–5.1)
Sodium: 142 mmol/L (ref 136–145)
Total Bilirubin: 0.4 mg/dL (ref 0.0–1.2)
Total Protein: 6.2 g/dL — ABNORMAL LOW (ref 6.4–8.3)

## 2024-09-10 MED ORDER — CULTURELLE PO CAPS
Freq: Every day | ORAL | Status: DC
Start: 2024-09-10 — End: 2024-09-12
  Administered 2024-09-10 – 2024-09-12 (×3): 1 via ORAL

## 2024-09-10 MED ORDER — ONDANSETRON 4 MG PO TBDP
4 | Freq: Once | ORAL | Status: AC
Start: 2024-09-10 — End: 2024-09-10
  Administered 2024-09-10: 17:00:00 4 mg via ORAL

## 2024-09-10 MED ORDER — SUCRALFATE 1 G PO TABS
1 | Freq: Four times a day (QID) | ORAL | Status: DC
Start: 2024-09-10 — End: 2024-09-12
  Administered 2024-09-10 – 2024-09-12 (×9): 1 g via ORAL

## 2024-09-10 MED ORDER — CLONAZEPAM 1 MG PO TABS
1 | Freq: Two times a day (BID) | ORAL | Status: DC | PRN
Start: 2024-09-10 — End: 2024-09-12
  Administered 2024-09-10 – 2024-09-12 (×4): 1 mg via ORAL

## 2024-09-10 MED ORDER — ALUM & MAG HYDROXIDE-SIMETH 200-200-20 MG/5ML PO SUSP
200-200-20 | Freq: Four times a day (QID) | ORAL | Status: DC | PRN
Start: 2024-09-10 — End: 2024-09-12
  Administered 2024-09-12: 12:00:00 30 mL via ORAL

## 2024-09-10 MED ORDER — MELOXICAM 7.5 MG PO TABS
7.5 | Freq: Every day | ORAL | Status: DC
Start: 2024-09-10 — End: 2024-09-12
  Administered 2024-09-10 – 2024-09-12 (×3): 15 mg via ORAL

## 2024-09-10 MED ORDER — TRAZODONE HCL 50 MG PO TABS
50 | Freq: Every evening | ORAL | Status: DC
Start: 2024-09-10 — End: 2024-09-12

## 2024-09-10 MED ORDER — POTASSIUM CHLORIDE CRYS ER 20 MEQ PO TBCR
20 | Freq: Every day | ORAL | Status: DC
Start: 2024-09-10 — End: 2024-09-12
  Administered 2024-09-10 – 2024-09-12 (×3): 20 meq via ORAL

## 2024-09-10 MED FILL — KETOROLAC TROMETHAMINE 30 MG/ML IJ SOLN: 30 mg/mL | INTRAMUSCULAR | Qty: 1 | Fill #0

## 2024-09-10 MED FILL — CLONAZEPAM 1 MG PO TABS: 1 mg | ORAL | Qty: 1 | Fill #0

## 2024-09-10 MED FILL — NICOTINE 14 MG/24HR TD PT24: 14 mg/(24.h) | TRANSDERMAL | Qty: 1 | Fill #0

## 2024-09-10 MED FILL — METRONIDAZOLE 500 MG/100ML IV SOLN: 500 MG/100ML | INTRAVENOUS | Qty: 100 | Fill #0

## 2024-09-10 MED FILL — DILAUDID 1 MG/ML IJ SOLN: 1 mg/mL | INTRAMUSCULAR | Qty: 1 | Fill #0

## 2024-09-10 MED FILL — KLOR-CON M20 20 MEQ PO TBCR: 20 meq | ORAL | Qty: 1 | Fill #0

## 2024-09-10 NOTE — Progress Notes (Signed)
 Spiritual Health Progress Note  [place]      Room # 206/01    Name: Yolanda Weber           Age: 53 y.o.    Gender: female          MRN: [id number]  Religion: [employer]       Preferred Language: English      Date: 09/10/24  Visit Time: Begin Time: 1135 End Time : 1200         Visit Summary: This Chaplain met with this Patient alone in her room resting in bed on rounds. Patient shared emotions and feelings regarding decisions she is in the process of making that will improve and impact her overall health and well-being. Shared her support system includes her parents, child, family, her faith and belief in God and work. Listened attentively. Maintained sustaining ministry of presence. Patient thanked chaplain for visit. No future visits requested.    Referral/Consult From: Rounding  Encounter Overview/Reason: Initial Encounter  Encounter Code:     Crisis (if applicable):    Service Provided For: Patient     Patient was available.    Faith, Belief, Meaning:   Patient identifies as spiritual  is connected with a faith tradition or spiritual practice  has beliefs or practices that help with coping during difficult times  faith/ spirituality is a source of strength  Family/Friends No family/friends present  Rituals (if applicable)      Importance and Influence:  Patient has spiritual/personal beliefs that influence decisions regarding their health  Family/Friends No family/friends present    Community:  Patient   No family/ friends present.  Family/Friends   No family/ friends present.    Assessment and Plan of Care:   Emotions Expressed by Patient:   Assessment: Anxious, Impaired resilience, Impaired social interaction, Interrupted family processes, Stress overload, Compromised coping    Interventions by Chaplain:   Intervention: Active listening, Discussed illness injury and it's impact, Read/Provided Scripture, Higher education careers adviser of presence, Life review/Legacy, Discussed belief system/religious practices/faith,  Explored Coping Skills/Resources, Explored/Affirmed feelings, thoughts, concerns, Discussed relationship with God     Result/ Response by Patient:   Outcome: Engaged in conversation, Venting emotion, Expressed feelings of Joy, Peace and/or Love, Expressed feelings, needs, and concerns, Receptive, Encouraged    Patient Plan of Care:   Plan and Referrals  Plan/Referrals: No future visits requested     Emotions Expressed by Spouse/Family/Friends:   No family/ friends present when chaplain visited.    Chaplain Interventions with Spouse/ Family/Friends include:   active listening  explored belief system  facilitated expression of thoughts and feelings  explored coping skills  affirmed coping skills/ support systems  engaged in theological/ spiritual reflection  provided ministry of presence  facilitated life review and/or legacy    Spouse/Family/Friends Plan of Care:   No future visits per patient/ family request.      Electronically signed by

## 2024-09-10 NOTE — Plan of Care (Signed)
 Problem: ABCDS Injury Assessment  Goal: Absence of physical injury  09/09/2024 2328 by Elaine Doffing, RN  Outcome: Progressing  Flowsheets (Taken 09/09/2024 1950)  Absence of Physical Injury: Implement safety measures based on patient assessment     Problem: Pain  Goal: Verbalizes/displays adequate comfort level or baseline comfort level  09/10/2024 1224 by Gita Andres Helling, RN  Outcome: Progressing  Flowsheets (Taken 09/10/2024 1224)  Verbalizes/displays adequate comfort level or baseline comfort level:   Encourage patient to monitor pain and request assistance   Assess pain using appropriate pain scale   Administer analgesics based on type and severity of pain and evaluate response  09/09/2024 2328 by Elaine Doffing, RN  Outcome: Progressing  Flowsheets (Taken 09/09/2024 1945)  Verbalizes/displays adequate comfort level or baseline comfort level: Encourage patient to monitor pain and request assistance

## 2024-09-10 NOTE — Progress Notes (Signed)
 Shift assessment review completed

## 2024-09-10 NOTE — Progress Notes (Signed)
 Hospitalist Progress Note          Dr. Alverda Kaiser MD      Date:09/10/2024       Room:206/01  Patient Name:Yolanda Weber     Date of Birth:February 25, 1971     Age:53 y.o.      Chief Complaint   Patient presents with    Vomiting    Diarrhea    Dehydration         HPI as per admitting provider on 09/09/2024  Yolanda Weber is a 53 y.o. female followed by [name], MD and  has a past medical history of Angiomyolipoma of both kidneys, Bulging lumbar disc, Diverticulosis, Fibromyalgia, Sciatica, and Sickle cell trait.    Presents recently in May and July and then again today with similar complaints.  She did follow-up recently with Dr. Myriam and has EGD and colonoscopy scheduled for October 1st.  Patient states that 3 days ago her stomach was feeling uneasy started having abdominal pain so try to decrease her food intake and try to eat only fruits and vegetables and small amounts.  She had been on chronic Protonix  and has ran out over the past few weeks.  She said 2 days ago she also started having right mouth pain and swelling and was placed on amoxicillin  which decreased and improved the pain.  She initially had nausea vomiting for the first day and then started with diarrhea where she said she had at least 7-8 loose stools but denied seeing any blood.  She was seen in the emergency room and blood pressure was 113/92 heart rate 74 afebrile 97.9 and O2 sat 97% on room air.  Initial labs show potassium 5.6 normal BUN/creatinine.  AST elevated at 63 and ALT at 64 alk phosphatase is 105 which previously was ALT which is noted to be in a normal range.  Mild leukocytosis of 14.  CT of the abdomen pelvis shows chronic hemangioma and the central liver status post cholecystectomy did note some post procedure no CBD dilation.  Multiple bilateral renal masses similar to prior study extensive diverticulosis of the colon without acute inflammation as well as uterine fibroids noted.  Was given cefepime  2 g x 1  as well as Flagyl  5 mg IV x 1 as well as Toradol  15 mg IV x 1 and morphine  4 mg IV x 2 doses and Zofran .  As well as a 1 L normal saline.  Was referred for admission for recurrent acute diverticulitis     Discussed management with the ED provider Dr. Helene Sharps and and agree with the plan for hospitalization       Subjective      09/10/2024: seen on follow up.  Sitting up in bed continues to have some nausea but no vomiting.  Initially noted to have tolerated full liquid diet patient wished to advance but upon evaluation still had left upper quadrant abdominal pain.  She also noted that she was hoping to be able to tolerate diet and possibly discharged tomorrow for that reason advanced her to soft easy to and will evaluate.  Will attempt to transition her antibiotics to oral on 9/18      Review of Systems   Constitutional: Negative.    HENT: Negative.     Eyes: Negative.    Respiratory: Negative.     Cardiovascular: Negative.    Gastrointestinal:  Positive for abdominal pain.   Endocrine: Negative.    Genitourinary: Negative.    Musculoskeletal: Negative.  Skin: Negative.    Allergic/Immunologic: Negative.    Neurological: Negative.    Hematological: Negative.    Psychiatric/Behavioral: Negative.         Objective           Vitals Last 24 Hours:  Patient Vitals for the past 24 hrs:   Temp Pulse Resp BP SpO2   09/10/24 0743 97.7 F (36.5 C) 74 20 (!) 149/94 100 %   09/10/24 0703 -- -- 18 -- --   09/10/24 0633 -- -- 16 -- --   09/10/24 0354 97.3 F (36.3 C) 55 16 122/77 100 %   09/09/24 2356 97.6 F (36.4 C) (!) 48 18 98/68 100 %   09/09/24 2222 -- -- 18 -- --   09/09/24 2150 -- -- 18 -- --   09/09/24 1945 97 F (36.1 C) 50 18 113/74 98 %   09/09/24 1650 -- -- 18 -- --   09/09/24 1620 -- -- 18 -- --   09/09/24 1501 97.2 F (36.2 C) 57 18 (!) 140/86 100 %   09/09/24 1453 97.2 F (36.2 C) 57 18 (!) 140/86 100 %   09/09/24 1345 -- -- -- (!) 119/58 98 %   09/09/24 1330 -- -- -- 137/60 95 %   09/09/24 1315 -- -- --  129/69 98 %   09/09/24 1300 -- -- -- 135/81 98 %   09/09/24 1245 -- -- -- (!) 124/97 97 %   09/09/24 1230 -- -- -- 138/64 98 %   09/09/24 1130 -- -- -- (!) 141/80 --   09/09/24 1115 -- -- -- 97/72 --        I/O (24Hr):    Intake/Output Summary (Last 24 hours) at 09/10/2024 1112  Last data filed at 09/10/2024 0457  Gross per 24 hour   Intake 2159.02 ml   Output 700 ml   Net 1459.02 ml       Physical Exam:  General: Alert, cooperative, no distress.  Head:   Normocephalic, without obvious abnormality, atraumatic.  Eyes:   Conjunctivae/corneas clear. Pupils equal, round, reactive to light. Extraocular movements intact.  Lungs:  Clear to auscultation bilaterally, no wheezes, crackles  Chest wall: No tenderness or deformity.  Heart:  Regular rate and rhythm, S1, S2 normal, no murmur, click, rub, or gallop.  Abdomen:        Soft, left upper quadrant tenderness, Positive Bowel sounds normal. No masses. No organomegaly.  No guarding or rebound  Back:  No spine tenderness to palpation  Extremities:Extremities normal, atraumatic, no cyanosis or edema.  Pulses: Symmetric all extremities.  Skin:    Skin color, texture, turgor normal.   Lymph nodes: Cervical nodes normal.  Neurologic: CNII-XII intact. Normal strength, sensation, and reflexes throughout.      Active Medications           Current Facility-Administered Medications   Medication Dose Route Frequency    sodium chloride  0.9 % bolus 1,000 mL  1,000 mL IntraVENous Once    metroNIDAZOLE  (FLAGYL ) 500 mg in 0.9% NaCl 100 mL IVPB premix  500 mg IntraVENous Q8H    HYDROmorphone  (DILAUDID ) injection 1 mg  1 mg IntraVENous Q4H PRN    0.45 % sodium chloride  infusion   IntraVENous Continuous    sodium chloride  flush 0.9 % injection 5-40 mL  5-40 mL IntraVENous 2 times per day    sodium chloride  flush 0.9 % injection 5-40 mL  5-40 mL IntraVENous PRN    0.9 % sodium chloride  infusion  IntraVENous PRN    potassium chloride  (KLOR-CON  M) extended release tablet 40 mEq  40 mEq Oral PRN     Or    potassium bicarb-citric acid  (EFFER-K) effervescent tablet 40 mEq  40 mEq Oral PRN    Or    potassium chloride  10 mEq/100 mL IVPB (Peripheral Line)  10 mEq IntraVENous PRN    magnesium  sulfate 2000 mg in 50 mL IVPB premix  2,000 mg IntraVENous PRN    enoxaparin  (LOVENOX ) injection 40 mg  40 mg SubCUTAneous Daily    ondansetron  (ZOFRAN -ODT) disintegrating tablet 4 mg  4 mg Oral Q8H PRN    Or    ondansetron  (ZOFRAN ) injection 4 mg  4 mg IntraVENous Q6H PRN    polyethylene glycol (GLYCOLAX ) packet 17 g  17 g Oral Daily PRN    acetaminophen  (TYLENOL ) tablet 650 mg  650 mg Oral Q6H PRN    Or    acetaminophen  (TYLENOL ) suppository 650 mg  650 mg Rectal Q6H PRN    pantoprazole  (PROTONIX ) 40 mg in sodium chloride  (PF) 0.9 % 10 mL injection  40 mg IntraVENous Q12H    levoFLOXacin  (LEVAQUIN ) 750 MG/150ML infusion 750 mg  750 mg IntraVENous Q24H    nicotine  (NICODERM CQ ) 14 MG/24HR 1 patch  1 patch TransDERmal Daily    clonazePAM  (KLONOPIN ) tablet 1 mg  1 mg Oral BID PRN    meloxicam  (MOBIC ) tablet 15 mg  15 mg Oral Daily    lactobacillus (CULTURELLE) capsule 1 capsule  1 capsule Oral Daily with breakfast    traZODone  (DESYREL ) tablet 25 mg  25 mg Oral Nightly    aluminum  & magnesium  hydroxide-simethicone  (MAALOX PLUS) 200-200-20 MG/5ML suspension 30 mL  30 mL Oral Q6H PRN    sucralfate  (CARAFATE ) tablet 1 g  1 g Oral 4x Daily AC & HS         Allergies         Shellfish allergy and Ibuprofen       Labs/Imaging/Diagnostics      Labs:  Recent Results (from the past 48 hours)   EKG 12 Lead    Collection Time: 09/09/24  8:41 AM   Result Value Ref Range    Ventricular Rate 83 BPM    Atrial Rate 82 BPM    P-R Interval 126 ms    QRS Duration 82 ms    Q-T Interval 402 ms    QTc Calculation (Bazett) 473 ms    P Axis 46 degrees    R Axis 38 degrees    T Axis 48 degrees    Diagnosis       Sinus rhythm  Left atrial enlargement    Confirmed by FLOYDENE MD, MARK ([id number]) on 09/10/2024 10:38:00 AM     Troponin    Collection Time:  09/09/24  9:04 AM   Result Value Ref Range    Troponin T <6.0 0 - 14 ng/L   CBC with Auto Differential    Collection Time: 09/09/24  9:04 AM   Result Value Ref Range    WBC 14.0 (H) 3.6 - 11.0 K/uL    RBC 4.65 3.80 - 5.20 M/uL    Hemoglobin 13.2 11.5 - 16.0 g/dL    Hematocrit 61.9 64.9 - 47.0 %    MCV 81.7 80.0 - 99.0 FL    MCH 28.4 26.0 - 34.0 PG    MCHC 34.7 30.0 - 36.5 g/dL    RDW 85.1 (H) 88.4 - 14.5 %    Platelets 403 (H)  150 - 400 K/uL    MPV 9.8 8.9 - 12.9 FL    Nucleated RBCs 0.0 0.0 PER 100 WBC    nRBC 0.00 0.00 - 0.01 K/uL    Neutrophils % 60.1 32.0 - 75.0 %    Lymphocytes % 28.8 12.0 - 49.0 %    Monocytes % 8.9 5.0 - 13.0 %    Eosinophils % 1.4 0.0 - 7.0 %    Basophils % 0.4 0.0 - 1.0 %    Immature Granulocytes % 0.4 0 - 0.5 %    Neutrophils Absolute 8.41 (H) 1.80 - 8.00 K/UL    Lymphocytes Absolute 4.03 (H) 0.80 - 3.50 K/UL    Monocytes Absolute 1.24 (H) 0.00 - 1.00 K/UL    Eosinophils Absolute 0.20 0.00 - 0.40 K/UL    Basophils Absolute 0.06 0.00 - 0.10 K/UL    Immature Granulocytes Absolute 0.06 (H) 0.00 - 0.04 K/UL    Differential Type AUTOMATED     Comprehensive Metabolic Panel    Collection Time: 09/09/24  9:04 AM   Result Value Ref Range    Sodium 140 136 - 145 mmol/L    Potassium 5.6 (H) 3.5 - 5.1 mmol/L    Chloride 102 98 - 107 mmol/L    CO2 16 (L) 20 - 29 mmol/L    Anion Gap 22 (H) 2 - 14 mmol/L    Glucose 109 (H) 65 - 100 mg/dL    BUN 19 6 - 20 mg/dL    Creatinine 9.29 9.39 - 1.00 mg/dL    BUN/Creatinine Ratio 28 (H) 12 - 20      Est, Glom Filt Rate >90 >59 ml/min/1.22m2    Calcium 9.8 8.6 - 10.0 mg/dL    Total Bilirubin 0.5 0.0 - 1.2 mg/dL    AST 63 (H) 10 - 35 U/L    ALT 64 (H) 10 - 35 U/L    Alk Phosphatase 105 (H) 35 - 104 U/L    Total Protein 8.0 6.4 - 8.3 g/dL    Albumin 3.7 3.5 - 5.2 g/dL    Globulin 4.3 (H) 2.0 - 4.0 g/dL    Albumin/Globulin Ratio 0.8 (L) 1.1 - 2.2     Lipase    Collection Time: 09/09/24  9:04 AM   Result Value Ref Range    Lipase 24 13 - 60 U/L   Magnesium      Collection Time: 09/09/24  2:27 PM   Result Value Ref Range    Magnesium  1.8 1.6 - 2.6 mg/dL   Procalcitonin    Collection Time: 09/09/24  2:27 PM   Result Value Ref Range    Procalcitonin <0.02 ng/mL   C-Reactive Protein    Collection Time: 09/09/24  2:27 PM   Result Value Ref Range    CRP <0.3 0.0 - 0.5 mg/dL   Urinalysis with Reflex to Culture    Collection Time: 09/09/24  8:00 PM    Specimen: Urine   Result Value Ref Range    Color, UA Yellow/Straw      Appearance Clear Clear      Specific Gravity, UA <1.005 1.003 - 1.030    pH, Urine 6.5 5.0 - 8.0      Protein, UA Negative Negative mg/dL    Glucose, Ur Negative Negative mg/dL    Ketones, Urine Negative Negative mg/dL    Bilirubin, Urine Negative Negative      Blood, Urine Small (A) Negative      Urobilinogen, Urine 0.2 0.2 - 1.0 EU/dL  Nitrite, Urine Negative Negative      Leukocyte Esterase, Urine Negative Negative      WBC, UA 0-4 0 - 5 /hpf    RBC, UA 0-5 0 - 3 /hpf    BACTERIA, URINE 1+ (A) Negative /hpf    Urine Culture if Indicated Culture not indicated by UA result Culture not indicated by UA result     Comprehensive Metabolic Panel    Collection Time: 09/10/24  6:15 AM   Result Value Ref Range    Sodium 142 136 - 145 mmol/L    Potassium 3.3 (L) 3.5 - 5.1 mmol/L    Chloride 106 98 - 107 mmol/L    CO2 25 20 - 29 mmol/L    Anion Gap 11 2 - 14 mmol/L    Glucose 84 65 - 100 mg/dL    BUN 10 6 - 20 mg/dL    Creatinine 9.45 (L) 0.60 - 1.00 mg/dL    BUN/Creatinine Ratio 19 12 - 20      Est, Glom Filt Rate >90 >59 ml/min/1.66m2    Calcium 9.0 8.6 - 10.0 mg/dL    Total Bilirubin 0.4 0.0 - 1.2 mg/dL    AST 16 10 - 35 U/L    ALT 41 (H) 10 - 35 U/L    Alk Phosphatase 89 35 - 104 U/L    Total Protein 6.2 (L) 6.4 - 8.3 g/dL    Albumin 2.8 (L) 3.5 - 5.2 g/dL    Globulin 3.4 2.0 - 4.0 g/dL    Albumin/Globulin Ratio 0.8 (L) 1.1 - 2.2     CBC with Auto Differential    Collection Time: 09/10/24  6:15 AM   Result Value Ref Range    WBC 9.3 3.6 - 11.0 K/uL    RBC 4.16 3.80  - 5.20 M/uL    Hemoglobin 11.8 11.5 - 16.0 g/dL    Hematocrit 65.5 (L) 35.0 - 47.0 %    MCV 82.7 80.0 - 99.0 FL    MCH 28.4 26.0 - 34.0 PG    MCHC 34.3 30.0 - 36.5 g/dL    RDW 85.4 88.4 - 85.4 %    Platelets 432 (H) 150 - 400 K/uL    MPV 9.4 8.9 - 12.9 FL    Nucleated RBCs 0.0 0.0 PER 100 WBC    nRBC 0.00 0.00 - 0.01 K/uL    Neutrophils % 42.8 32.0 - 75.0 %    Lymphocytes % 46.9 12.0 - 49.0 %    Monocytes % 7.8 5.0 - 13.0 %    Eosinophils % 2.0 0.0 - 7.0 %    Basophils % 0.2 0.0 - 1.0 %    Immature Granulocytes % 0.3 0 - 0.5 %    Neutrophils Absolute 3.97 1.80 - 8.00 K/UL    Lymphocytes Absolute 4.36 (H) 0.80 - 3.50 K/UL    Monocytes Absolute 0.72 0.00 - 1.00 K/UL    Eosinophils Absolute 0.19 0.00 - 0.40 K/UL    Basophils Absolute 0.02 0.00 - 0.10 K/UL    Immature Granulocytes Absolute 0.03 0.00 - 0.04 K/UL    Differential Type AUTOMATED          Imaging:  CT ABDOMEN PELVIS W IV CONTRAST Additional Contrast? None  Result Date: 09/09/2024  1. No acute abdominal or pelvic pathology. 2. Extensive sigmoid diverticulitis without acute inflammation. 3. Liver hemangiomas and multiple bilateral renal AML lesions. 4. Uterine fibroid. Electronically signed by Oneil Fireman, MD       Assessment//Plan  Problem List:  Hospital Problems           Last Modified POA    * (Principal) Acute diverticulitis 09/09/2024 Yes    Diverticulitis 09/09/2024 Yes    Leukocytosis 09/09/2024 Yes    Hyperkalemia 09/09/2024 Yes    Elevated LFTs 09/09/2024 Yes      Acute Recurrent Sigmoid Diverticulitis   - Presents with similar 2 to 3 days of abdominal pain and then leading to nausea vomiting diarrhea  -CT shows extensive sigmoid diverticulosis but no obvious inflammation but with noted leukocytosis discussed with GI that most likely patient having another acute flare  -Has scheduled outpatient EGD and colonoscopy on October 1st  - GI consultation  -Given cefepime  and Flagyl  in the emergency room continue on Flagyl  every 8 hours and Levaquin   daily  -4 mg of IV morphine  given in the emergency room x 2 and continue Dilaudid  1 mg every 4 hours as needed for severe pain  - Noted leukocytosis but resolved as of 9/17  - Stool for enteric bacteria  - Probiotic daily  - DC IV fluids on 9/17  -Clear liquid diet advance depending on pain and on 9/17 trial of easy to chew soft diet.  No noted diarrhea     Hyperkalemia   - 5.6 on admission and continue current IV fluids and repeat is at 3.3  -Monitor on telemetry  -Follow repeat labs     Leukocytosis   - 14 on admission secondary to diverticulitis and repeat is in normal range  -Follow-up procalcitonin level negative at less than 0.02  - Discussed with GI recommend daily Levaquin  and Flagyl  500 mg every 8 hours  -Monitor daily CBC      Tobacco dependence   -Noted to have smoke about 5 to 6 cigarettes a day  - Encourage cessation given nicotine  patch 14 mg change daily  - patient wishes for patch as she feels she will get very anxious      Fibromyalgia   - Chronic noted to be using only Tylenol   - continue home meds trazadone at night and Klonapin 1mg  oral bid prn      DVT prophylaxis   - Lovenox       Code Status : Full Code   Designates Dallas Lander as medical contact          Discussion/MDM: Patient with multiple medical comorbidities, each with high likelihood for morbidity and mortality if left untreated.   I have reviewed patient's presenting subjective and objective findings, as well as all laboratory studies, imaging studies, and vital signs to date as well as treatment rendered and patient's response to those treatments.  In addition, prior medical, surgical and relevant social and family histories were reviewed. I have discussed management plan with patient/family and with nursing staff.      Toxic drug monitoring:    This is dictation was done by dragon, [employer] account planner. Quite often unanticipated grammatical, syntax, homophones and other interpretive errors or inadvertently transcribed  by the computer software. Please excuse errors that have escaped final proofreading. Thank you.       Spent 50 minutes evaluting and coordinating patients care      Electronically signed by Cedric Denison R Markavious Micco, MD on 09/10/2024 at 11:12 AM

## 2024-09-10 NOTE — Progress Notes (Signed)
 Patient ate 25% of lunch- potatoes and bread- supper ate only hamburger for supper- she stated she had abdominal cramping with both meals

## 2024-09-10 NOTE — Discharge Instructions (Signed)
 If you have diet related questions/concerns or would like to schedule an outpatient nutrition appointment, please contact Danett Palazzo @ [phone].

## 2024-09-11 LAB — BASIC METABOLIC PANEL
Anion Gap: 10 mmol/L (ref 2–14)
BUN/Creatinine Ratio: 18 (ref 12–20)
BUN: 11 mg/dL (ref 6–20)
CO2: 27 mmol/L (ref 20–29)
Calcium: 9.2 mg/dL (ref 8.6–10.0)
Chloride: 105 mmol/L (ref 98–107)
Creatinine: 0.61 mg/dL (ref 0.60–1.00)
Est, Glom Filt Rate: >90 ml/min/1.73m2 (ref >59)
Glucose: 94 mg/dL (ref 65–100)
Potassium: 3.6 mmol/L (ref 3.5–5.1)
Sodium: 142 mmol/L (ref 136–145)

## 2024-09-11 MED FILL — KLOR-CON M20 20 MEQ PO TBCR: 20 meq | ORAL | Qty: 1 | Fill #0

## 2024-09-11 NOTE — Plan of Care (Signed)
 Problem: ABCDS Injury Assessment  Goal: Absence of physical injury  09/11/2024 0938 by Delores Brunner D, LPN  Outcome: Progressing  09/11/2024 0512 by Joshua Montie CROME, LPN  Outcome: Progressing     Problem: Pain  Goal: Verbalizes/displays adequate comfort level or baseline comfort level  Outcome: Progressing

## 2024-09-11 NOTE — Progress Notes (Signed)
 Lying in bed awake.  C/o abdominal pain.  No c/o nausea.  Up ad lib to bathroom.

## 2024-09-11 NOTE — Progress Notes (Signed)
 Progress Note    Patient: Yolanda Weber MRN: [id number]  SSN: [id number]    Date of Birth: Jul 20, 1971  Age: 53 y.o.  Sex: female      Admit Date: 09/09/2024    LOS: 2 days     Subjective:     Patient admitted with acute diverticulitis.    Patient overall improving and able to tolerate advancing the diet.  Still with some nausea and some pain still.  Reported diarrhea.    Objective:     Vitals:    09/11/24 0452 09/11/24 0600 09/11/24 0730 09/11/24 0927   BP: 120/84  126/85    Pulse: 84  66    Resp:   20 18   Temp:   98.2 F (36.8 C)    TempSrc:   Oral    SpO2:   100%    Weight:  80.9 kg (178 lb 5.6 oz)     Height:            Intake and Output:  Current Shift: No intake/output data recorded.  Last three shifts: 09/16 1901 - 09/18 0700  In: 2933.6 [P.O.:660; I.V.:1285.4]  Out: 3300 [Urine:3300]    Physical Exam:   GENERAL: alert, cooperative, no distress, appears stated age  THROAT & NECK: normal and no erythema or exudates noted.   LUNG: clear to auscultation bilaterally  HEART: regular rate and rhythm, S1, S2 normal, no murmur, click, rub or gallop  ABDOMEN: soft, mild tenderness left lower quadrant. Bowel sounds normal. No masses,  no organomegaly  EXTREMITIES:  extremities normal, atraumatic, no cyanosis or edema  SKIN: no rash or abnormalities  NEUROLOGIC: AOx3. Gait normal. Reflexes and motor strength normal and symmetric. Cranial nerves 2-12 and sensation grossly intact.  PSYCHIATRIC: non focal    Lab/Data Review:  All lab results for the last 24 hours reviewed.     Recent Results (from the past 24 hours)   Basic Metabolic Panel    Collection Time: 09/11/24  6:10 AM   Result Value Ref Range    Sodium 142 136 - 145 mmol/L    Potassium 3.6 3.5 - 5.1 mmol/L    Chloride 105 98 - 107 mmol/L    CO2 27 20 - 29 mmol/L    Anion Gap 10 2 - 14 mmol/L    Glucose 94 65 - 100 mg/dL    BUN 11 6 - 20 mg/dL    Creatinine 9.38 9.39 - 1.00 mg/dL    BUN/Creatinine Ratio 18 12 - 20      Est, Glom Filt Rate >90 >59 ml/min/1.6m2     Calcium 9.2 8.6 - 10.0 mg/dL        Imaging:    CT ABDOMEN PELVIS W IV CONTRAST Additional Contrast? None  Result Date: 09/09/2024  EXAM: CT ABDOMEN PELVIS W IV CONTRAST INDICATION: Probable recurrence of acute diverticulitis tender left lower quadrant COMPARISON: CT July 13, 2024 CONTRAST: 100 mL of Isovue -370. ORAL CONTRAST: Not given TECHNIQUE: Following intravenous administration of contrast, thin axial images were obtained through the abdomen and pelvis. Coronal and sagittal reconstructions were generated. CT dose reduction was achieved through use of a standardized protocol tailored for this examination and automatic exposure control for dose modulation. FINDINGS: LOWER THORAX: Mild linear atelectasis versus scarring, right middle lobe LIVER: Hemangioma in the central liver BILIARY TREE: Status post cholecystectomy. Postprocedural CBD dilatation. SPLEEN: within normal limits. PANCREAS: No mass or ductal dilatation. ADRENALS: Unremarkable. KIDNEYS: Multiple bilateral renal masses, similar to prior study, the largest  exophytic arising from the lateral left upper pole measuring 2.8 cm, likely AML's. No hydronephrosis. STOMACH: Unremarkable. SMALL BOWEL: No dilatation or wall thickening. COLON: Extensive diverticulosis of the colon without acute inflammation APPENDIX: Normal PERITONEUM: No ascites or pneumoperitoneum. RETROPERITONEUM: Aortic atherosclerosis REPRODUCTIVE ORGANS: Uterine fibroids URINARY BLADDER: No mass or calculus. BONES: No destructive bone lesion. ABDOMINAL WALL: No mass or hernia. ADDITIONAL COMMENTS: N/A     1. No acute abdominal or pelvic pathology. 2. Extensive sigmoid diverticulitis without acute inflammation. 3. Liver hemangiomas and multiple bilateral renal AML lesions. 4. Uterine fibroid. Electronically signed by Oneil Fireman, MD         Assessment and Plan:     Acute sigmoid diverticulitis  - Patient presented with symptoms of abdominal pain, nausea, vomiting and diarrhea  - CT abdomen  pelvis shows extensive sigmoid diverticulosis  - Overall symptomatically improving and tolerating advancing the diet  - GI has been consulted and pending evaluation  - Continue with Levaquin  and Flagyl     Leukocytosis  - Due to diverticulitis  - Leukocytosis now resolved  - Continue monitor    GERD  - Continue Protonix  and Carafate     Fibromyalgia  - Continue home meds including Klonopin  and trazodone     Nicotine  dependence  - Continue with nicotine  patch  - Counseling done    Discharge disposition: Likely in the next 24 hours    Signed By: Hillery Duke, MD     September 11, 2024

## 2024-09-11 NOTE — Progress Notes (Signed)
 Dr. Gwinda Passe in to see pt.

## 2024-09-11 NOTE — Progress Notes (Signed)
 Pt. Awake talking on cell phone being medicated twice this shift for pain once for nausea  pt. Tolerating all meals pt. Ambulates to bathroom .

## 2024-09-11 NOTE — Progress Notes (Signed)
 Called down to surgical services and made staff aware of GI consult for patient for diverticulitis.

## 2024-09-11 NOTE — Plan of Care (Signed)
 Problem: ABCDS Injury Assessment  Goal: Absence of physical injury  Outcome: Progressing

## 2024-09-11 NOTE — Progress Notes (Signed)
 Stool sent to lab

## 2024-09-11 NOTE — Progress Notes (Signed)
 Nutrition Education    Educated on [employer] Restricted Diet  Learners: Patient  Readiness: Acceptance  Method: Explanation, Handout, and Teachback  Response: Verbalizes Understanding  Contact name and number provided.    Discussed Fiber restricted diet during diverticulitis flare up and how to gradually add fiber back into diet to meet daily recs to prevent future flare ups. Provided contact info for OP nutrition program.     Yolanda Weber  Contact Number: ext [phone] or via [employer]

## 2024-09-11 NOTE — Progress Notes (Signed)
 Bedside shift change report given to Ronasia Isola(oncoming nurse) by Larrie Kass (offgoing nurse). Report included the following information Nurse Handoff Report.

## 2024-09-12 LAB — CBC WITH AUTO DIFFERENTIAL
Basophils %: 0.3 % (ref 0.0–1.0)
Basophils Absolute: 0.03 K/UL (ref 0.00–0.10)
Eosinophils %: 2.1 % (ref 0.0–7.0)
Eosinophils Absolute: 0.23 K/UL (ref 0.00–0.40)
Hematocrit: 35.2 % (ref 35.0–47.0)
Hemoglobin: 12.4 g/dL (ref 11.5–16.0)
Immature Granulocytes %: 0.4 % (ref 0–0.5)
Immature Granulocytes Absolute: 0.04 K/UL (ref 0.00–0.04)
Lymphocytes %: 42.3 % (ref 12.0–49.0)
Lymphocytes Absolute: 4.54 K/UL — ABNORMAL HIGH (ref 0.80–3.50)
MCH: 28.8 pg (ref 26.0–34.0)
MCHC: 35.2 g/dL (ref 30.0–36.5)
MCV: 81.9 FL (ref 80.0–99.0)
MPV: 9.5 FL (ref 8.9–12.9)
Monocytes %: 7.9 % (ref 5.0–13.0)
Monocytes Absolute: 0.85 K/UL (ref 0.00–1.00)
Neutrophils %: 47 % (ref 32.0–75.0)
Neutrophils Absolute: 5.05 K/UL (ref 1.80–8.00)
Nucleated RBCs: 0 /100{WBCs}
Platelets: 446 K/uL — ABNORMAL HIGH (ref 150–400)
RBC: 4.3 M/uL (ref 3.80–5.20)
RDW: 14.2 % (ref 11.5–14.5)
WBC: 10.7 K/uL (ref 3.6–11.0)
nRBC: 0 K/uL (ref 0.00–0.01)

## 2024-09-12 LAB — ENTERIC BACT PANEL, DNA
CAMPYLOBACTER SPECIES, DNA: NEGATIVE
ENTEROTOXIGEN E COLI, DNA: NEGATIVE
P. SHIGELLOIDES, DNA: NEGATIVE
SALMONELLA SPECIES, DNA: NEGATIVE
SHIGA TOXIN PRODUCING, DNA: NEGATIVE
SHIGELLA SPECIES, DNA: NEGATIVE
VIBRIO SPECIES, DNA: NEGATIVE
Y. ENTEROCOLITICA, DNA: NEGATIVE

## 2024-09-12 LAB — BASIC METABOLIC PANEL
Anion Gap: 10 mmol/L (ref 2–14)
BUN/Creatinine Ratio: 18 (ref 12–20)
BUN: 9 mg/dL (ref 6–20)
CO2: 27 mmol/L (ref 20–29)
Calcium: 9.3 mg/dL (ref 8.6–10.0)
Chloride: 105 mmol/L (ref 98–107)
Creatinine: 0.51 mg/dL — ABNORMAL LOW (ref 0.60–1.00)
Est, Glom Filt Rate: >90 ml/min/1.73m2 (ref >59)
Glucose: 91 mg/dL (ref 65–100)
Potassium: 3.2 mmol/L — ABNORMAL LOW (ref 3.5–5.1)
Sodium: 143 mmol/L (ref 136–145)

## 2024-09-12 MED ORDER — ONDANSETRON 4 MG PO TBDP
4 | ORAL_TABLET | Freq: Three times a day (TID) | ORAL | 0 refills | 7.00000 days | Status: DC | PRN
Start: 2024-09-12 — End: 2025-01-17

## 2024-09-12 MED ORDER — SUCRALFATE 1 G PO TABS
1 | ORAL_TABLET | Freq: Four times a day (QID) | ORAL | 0 refills | 30.00000 days | Status: DC
Start: 2024-09-12 — End: 2024-10-10

## 2024-09-12 MED ORDER — POTASSIUM CHLORIDE CRYS ER 20 MEQ PO TBCR
20 | Freq: Once | ORAL | Status: DC
Start: 2024-09-12 — End: 2024-09-12

## 2024-09-12 MED ORDER — TRAMADOL HCL 50 MG PO TABS
50 | ORAL_TABLET | Freq: Four times a day (QID) | ORAL | 0 refills | 7.00000 days | Status: AC | PRN
Start: 2024-09-12 — End: 2024-09-15

## 2024-09-12 MED ORDER — AMOXICILLIN-POT CLAVULANATE 875-125 MG PO TABS
875-125 | ORAL_TABLET | Freq: Two times a day (BID) | ORAL | 0 refills | 10.00000 days | Status: AC
Start: 2024-09-12 — End: 2024-09-16

## 2024-09-12 MED ORDER — NICOTINE 14 MG/24HR TD PT24
14 | MEDICATED_PATCH | Freq: Every day | TRANSDERMAL | 0 refills | 28.00000 days | Status: DC
Start: 2024-09-12 — End: 2025-01-17

## 2024-09-12 MED ORDER — POTASSIUM CHLORIDE CRYS ER 20 MEQ PO TBCR
20 | Freq: Once | ORAL | Status: AC
Start: 2024-09-12 — End: 2024-09-12
  Administered 2024-09-12: 14:00:00 20 meq via ORAL

## 2024-09-12 MED FILL — KLOR-CON M20 20 MEQ PO TBCR: 20 meq | ORAL | Qty: 2 | Fill #0

## 2024-09-12 MED FILL — KLOR-CON M20 20 MEQ PO TBCR: 20 meq | ORAL | Qty: 1 | Fill #0

## 2024-09-12 NOTE — Discharge Summary (Addendum)
 Discharge Summary       PATIENT ID: Yolanda Weber  MRN: [id number]   DATE OF BIRTH: April 23, 1971    DATE OF ADMISSION: 09/09/2024  8:40 AM    DATE OF DISCHARGE: 09/12/2024   PRIMARY CARE PROVIDER: [name], MD     ATTENDING PHYSICIAN: Hillery Duke  DISCHARGING PROVIDER: Hillery Duke, MD      CONSULTATIONS: IP CONSULT TO GI    PROCEDURES/SURGERIES: * No surgery found *    ADMISSION SUMMARY AND HOSPITAL COURSE:     Yolanda Weber is a 53 y.o. female followed by [name], MD and  has a past medical history of Angiomyolipoma of both kidneys, Bulging lumbar disc, Diverticulosis, Fibromyalgia, Sciatica, and Sickle cell trait.  Presents recently in May and July and then again today with similar complaints.  She did follow-up recently with Dr. Myriam and has EGD and colonoscopy scheduled for October 1st.  Patient states that 3 days ago her stomach was feeling uneasy started having abdominal pain so try to decrease her food intake and try to eat only fruits and vegetables and small amounts.  She had been on chronic Protonix  and has ran out over the past few weeks.  She said 2 days ago she also started having right mouth pain and swelling and was placed on amoxicillin  which decreased and improved the pain.  She initially had nausea vomiting for the first day and then started with diarrhea where she said she had at least 7-8 loose stools but denied seeing any blood. She was seen in the emergency room and blood pressure was 113/92 heart rate 74 afebrile 97.9 and O2 sat 97% on room air.  Initial labs show potassium 5.6 normal BUN/creatinine.  AST elevated at 63 and ALT at 64 alk phosphatase is 105 which previously was ALT which is noted to be in a normal range.  Mild leukocytosis of 14.  CT of the abdomen pelvis shows chronic hemangioma and the central liver status post cholecystectomy did note some post procedure no CBD dilation.  Multiple bilateral renal masses similar to prior study extensive  diverticulosis of the colon without acute inflammation as well as uterine fibroids noted.  Was given cefepime  2 g x 1 as well as Flagyl  5 mg IV x 1 as well as Toradol  15 mg IV x 1 and morphine  4 mg IV x 2 doses and Zofran .  As well as a 1 L normal saline.  Was referred for admission for recurrent acute diverticulitis.    Patient admitted and started on IV antibiotics including Levaquin  and Flagyl .  Overall patient's symptoms improving and tolerated advancing the diet.  Pain is overall improving as well.  Leukocytosis resolved.  Patient to stay hydrated at home and diet as tolerated.  Follow-up with primary care physician in 1 to 2 weeks.    DISCHARGE DIAGNOSES / PLAN:      BMI: Body mass index is 30.13 kg/m.SABRA This patient: Meets criteria for obesity given BMI >/= 30 and < 40 due to excess calories/nutritional. Weight loss and lifestyle modifications should be encouraged as an outpatient.     PENDING TEST RESULTS:   At the time of discharge the following test results are still pending: None     ADDITIONAL CARE RECOMMENDATIONS:     Follow-up with PCP in 1 to 2 weeks.     DIET: cardiac diet    ACTIVITY: activity as tolerated    EQUIPMENT needed: None    NOTIFY YOUR PHYSICIAN FOR  ANY OF THE FOLLOWING:   Fever over 101 degrees for 24 hours.   Chest pain, shortness of breath, fever, chills, nausea, vomiting, diarrhea, change in mentation, falling, weakness, bleeding. Severe pain or pain not relieved by medications, as well as any other signs or symptoms that you may have questions about.    FOLLOW UP APPOINTMENTS:    @DCFOLLOWUP @     DISCHARGE MEDICATIONS:     Medication List        START taking these medications      amoxicillin -clavulanate 875-125 MG per tablet  Commonly known as: AUGMENTIN   Take 1 tablet by mouth 2 times daily for 3 days  Start taking on: September 13, 2024     nicotine  14 MG/24HR  Commonly known as: NICODERM CQ   Place 1 patch onto the skin daily  Start taking on: September 13, 2024     ondansetron  4  MG disintegrating tablet  Commonly known as: ZOFRAN -ODT  Take 1 tablet by mouth 3 times daily as needed for Nausea or Vomiting     sucralfate  1 GM tablet  Commonly known as: CARAFATE   Take 1 tablet by mouth 4 times daily (before meals and nightly)            CONTINUE taking these medications      clonazePAM  1 MG tablet  Commonly known as: KLONOPIN      meloxicam  15 MG tablet  Commonly known as: MOBIC      pantoprazole  40 MG tablet  Commonly known as: PROTONIX   Take 1 tablet by mouth 2 times daily (with meals)     polyethylene glycol 17 GM/SCOOP powder  Commonly known as: GLYCOLAX   Use as directed by physician.               Where to Get Your Medications        These medications were sent to [id number] - [place], [place] - [address] - P [phone] [place] [place] [phone]  [address] [place], [place] [address]      Phone: [phone]   amoxicillin -clavulanate 875-125 MG per tablet  nicotine  14 MG/24HR  ondansetron  4 MG disintegrating tablet  sucralfate  1 GM tablet       Addendum to discharge med list  -Tramadol  50 mg p.o. every 6 hours as needed (#12 dispensed)      DISPOSITION:  *  Home With:   OT  PT  HH  RN       Long term SNF/Inpatient Rehab    Independent/assisted living    Hospice    Other:     PATIENT CONDITION AT DISCHARGE:     Functional status    Poor     Deconditioned    * Independent      Cognition    * Lucid     Forgetful     Dementia      Catheters/lines (plus indication)    Foley     PICC     PEG    * None      Code status    * Full code     DNR      PHYSICAL EXAMINATION AT DISCHARGE:    General:          Alert, cooperative, no distress, appears stated age.     HEENT:           Atraumatic, anicteric sclerae, pink conjunctivae  No oral ulcers, mucosa moist, throat clear, dentition fair  Neck:               Supple, symmetrical  Lungs:             Clear to auscultation bilaterally.  No Wheezing or Rhonchi. No rales.  Chest wall:      No tenderness  No Accessory  muscle use.  Heart:              Regular  rhythm,  No  murmur   No edema  Abdomen:        Soft, non-tender. Not distended.  Bowel sounds normal  Extremities:     No cyanosis.  No clubbing,                            Skin turgor normal, Capillary refill normal  Skin:                Not pale.  Not Jaundiced  No rashes   Psych:             Not anxious or agitated.  Neurologic:      Alert, moves all extremities, answers questions appropriately and responds to commands       CHRONIC MEDICAL DIAGNOSES:      Greater than 60 minutes were spent with the patient on counseling and coordination of care    Signed:   Hillery Duke, MD  09/12/2024  11:01 AM

## 2024-09-12 NOTE — Plan of Care (Signed)
 Problem: ABCDS Injury Assessment  Goal: Absence of physical injury  09/12/2024 1329 by Doreatha Lye, RN  Outcome: Adequate for Discharge  09/12/2024 0436 by Joshua Montie CROME, LPN  Outcome: Progressing     Problem: Pain  Goal: Verbalizes/displays adequate comfort level or baseline comfort level  Outcome: Adequate for Discharge

## 2024-09-12 NOTE — Progress Notes (Signed)
 Patient has a hospital visit follow up appointment with Dr. Dodie, Lonni PARAS, on 09/17/24 at 1:30pm. Patient is aware of appointment time and date.

## 2024-09-12 NOTE — Plan of Care (Signed)
 Problem: ABCDS Injury Assessment  Goal: Absence of physical injury  Outcome: Progressing

## 2024-09-24 ENCOUNTER — Inpatient Hospital Stay: Payer: Medicaid (Managed Care) | Attending: Gastroenterology

## 2024-09-24 MED ORDER — DEXMEDETOMIDINE HCL 200 MCG/2ML IV SOLN
200 | Freq: Once | INTRAVENOUS | Status: DC | PRN
Start: 2024-09-24 — End: 2024-09-24
  Administered 2024-09-24: 14:00:00 12 via INTRAVENOUS

## 2024-09-24 MED ORDER — PROPOFOL 200 MG/20ML IV EMUL
200 | INTRAVENOUS | Status: AC
Start: 2024-09-24 — End: 2024-09-24

## 2024-09-24 MED ORDER — GLYCOPYRROLATE 0.2 MG/ML IJ SOLN
0.2 | Freq: Once | INTRAMUSCULAR | Status: DC | PRN
Start: 2024-09-24 — End: 2024-09-24
  Administered 2024-09-24: 14:00:00 .2 via INTRAVENOUS

## 2024-09-24 MED ORDER — EPHEDRINE SULFATE (PRESSORS) 50 MG/ML IV SOLN
50 | Freq: Once | INTRAVENOUS | Status: DC | PRN
Start: 2024-09-24 — End: 2024-09-24
  Administered 2024-09-24: 14:00:00 10 via INTRAVENOUS

## 2024-09-24 MED ORDER — LIDOCAINE HCL (PF) 2 % IJ SOLN
2 | INTRAMUSCULAR | Status: AC
Start: 2024-09-24 — End: 2024-09-24

## 2024-09-24 MED ORDER — GLYCOPYRROLATE 0.2 MG/ML IJ SOLN
0.2 | INTRAMUSCULAR | Status: AC
Start: 2024-09-24 — End: 2024-09-24

## 2024-09-24 MED ORDER — EPHEDRINE SULFATE (PRESSORS) 50 MG/ML IV SOLN
50 | INTRAVENOUS | Status: AC
Start: 2024-09-24 — End: 2024-09-24

## 2024-09-24 MED ORDER — SODIUM CHLORIDE 0.9 % IV SOLN
0.9 | INTRAVENOUS | Status: DC
Start: 2024-09-24 — End: 2024-09-24
  Administered 2024-09-24: 13:00:00 via INTRAVENOUS

## 2024-09-24 MED ORDER — DEXMEDETOMIDINE HCL IN NACL 80 MCG/20ML IV SOLN
80 | INTRAVENOUS | Status: AC
Start: 2024-09-24 — End: 2024-09-24

## 2024-09-24 MED ORDER — PROPOFOL 200 MG/20ML IV EMUL
200 | Freq: Once | INTRAVENOUS | Status: DC | PRN
Start: 2024-09-24 — End: 2024-09-24
  Administered 2024-09-24 (×4): 50 via INTRAVENOUS

## 2024-09-24 MED ORDER — LIDOCAINE HCL (PF) 2 % IJ SOLN
2 | Freq: Once | INTRAMUSCULAR | Status: DC | PRN
Start: 2024-09-24 — End: 2024-09-24
  Administered 2024-09-24: 14:00:00 60 via INTRAVENOUS

## 2024-09-24 MED FILL — XYLOCAINE-MPF 2 % IJ SOLN: 2 % | INTRAMUSCULAR | Qty: 5 | Fill #0

## 2024-09-24 MED FILL — DIPRIVAN 200 MG/20ML IV EMUL: 200 MG/20ML | INTRAVENOUS | Qty: 20 | Fill #0

## 2024-09-24 MED FILL — EPHEDRINE SULFATE (PRESSORS) 50 MG/ML IV SOLN: 50 mg/mL | INTRAVENOUS | Qty: 1 | Fill #0

## 2024-09-24 MED FILL — GLYCOPYRROLATE 0.2 MG/ML IJ SOLN: 0.2 mg/mL | INTRAMUSCULAR | Qty: 1 | Fill #0

## 2024-09-24 MED FILL — DEXMEDETOMIDINE HCL IN NACL 80 MCG/20ML IV SOLN: 80 MCG/20ML | INTRAVENOUS | Qty: 20 | Fill #0

## 2024-09-24 MED FILL — SODIUM CHLORIDE 0.9 % IV SOLN: 0.9 % | INTRAVENOUS | Qty: 1000 | Fill #0

## 2024-09-24 NOTE — Anesthesia Post-Procedure Evaluation (Signed)
 Department of Anesthesiology  Postprocedure Note    Patient: Yolanda Weber  MRN: [id number]  Birthdate: Feb 04, 1971  Date of evaluation: 09/24/2024    Procedure Summary       Date: 09/24/24 Room / Location: [facility]    Anesthesia Start: 0955 Anesthesia Stop: 1038    Procedures:       COLONOSCOPY (Anus)      ESOPHAGOGASTRODUODENOSCOPY BIOPSY (Mouth) Diagnosis:       Diarrhea of presumed infectious origin      Nausea with vomiting      (Diarrhea of presumed infectious origin [R19.7])      (Nausea with vomiting [R11.2])    Surgeons: Myriam Nivia Raddle., MD Responsible Provider:     Anesthesia Type: MAC ASA Status: 2            Anesthesia Type: MAC    Aldrete Phase I: Aldrete Score: 10    Aldrete Phase II:      Anesthesia Post Evaluation    Patient location during evaluation: bedside  Patient participation: complete - patient participated  Level of consciousness: sleepy but conscious  Pain score: 0  Airway patency: patent  Nausea & Vomiting: no vomiting and no nausea  Cardiovascular status: blood pressure returned to baseline  Respiratory status: room air  Hydration status: stable      No notable events documented.

## 2024-09-24 NOTE — Progress Notes (Signed)
 Dr. Gwinda Passe in

## 2024-09-24 NOTE — Anesthesia Pre-Procedure Evaluation (Signed)
 Department of Anesthesiology  Preprocedure Note       Name:  Yolanda Weber   Age:  53 y.o.  DOB:  1971/09/03                                          MRN:  [id number]         Date:  09/24/2024      Surgeon: Clotilde):  Myriam Nivia Raddle., MD    Procedure: Procedure(s):  COLONOSCOPY  ESOPHAGOGASTRODUODENOSCOPY BIOPSY    Medications prior to admission:   Prior to Admission medications   Medication Sig Start Date End Date Taking? Authorizing Provider   nicotine  (NICODERM CQ ) 14 MG/24HR Place 1 patch onto the skin daily 09/13/24  Yes Zaw, Kyaw T, MD   sucralfate  (CARAFATE ) 1 GM tablet Take 1 tablet by mouth 4 times daily (before meals and nightly) 09/12/24  Yes Zaw, Kyaw T, MD   ondansetron  (ZOFRAN -ODT) 4 MG disintegrating tablet Take 1 tablet by mouth 3 times daily as needed for Nausea or Vomiting 09/12/24  Yes Zaw, Kyaw T, MD   polyethylene glycol (GLYCOLAX ) 17 GM/SCOOP powder Use as directed by physician. 08/28/24  Yes Myriam Nivia Raddle., MD   meloxicam  (MOBIC ) 15 MG tablet Take 1 tablet by mouth daily 04/29/24  Yes [provider]   clonazePAM  (KLONOPIN ) 1 MG tablet Take 1 tablet by mouth 2 times daily.   Yes Automatic Reconciliation, Ar   pantoprazole  (PROTONIX ) 40 MG tablet Take 1 tablet by mouth 2 times daily (with meals) 05/12/24 07/11/24  Mandadi, Goutham R, MD       Current medications:    Current Facility-Administered Medications   Medication Dose Route Frequency Provider Last Rate Last Admin   . 0.9 % sodium chloride  infusion   IntraVENous Continuous Gilliam, [place] Jr., MD 75 mL/hr at 09/24/24 0917 New Bag at 09/24/24 0917       Allergies:    Allergies   Allergen Reactions   . Shellfish Allergy Hives   . Ibuprofen Nausea And Vomiting       Problem List:    Patient Active Problem List   Diagnosis Code   . Diverticulitis K57.92   . Acute diverticulitis K57.92   . Leukocytosis D72.829   . Hypokalemia E87.6   . Sigmoid diverticulitis K57.32   . Diarrhea of presumed infectious origin R19.7   . Nausea  with vomiting R11.2   . Hyperkalemia E87.5   . Elevated LFTs R79.89       Past Medical History:        Diagnosis Date   . Angiomyolipoma of both kidneys    . Bulging lumbar disc    . Diverticulosis    . Fibromyalgia    . Sciatica    . Sickle cell trait        Past Surgical History:        Procedure Laterality Date   . CHOLECYSTECTOMY     . COLONOSCOPY     . CYST REMOVAL Right     Breast   . DILATION AND CURETTAGE OF UTERUS         Social History:    Social History     Tobacco Use   . Smoking status: Some Days     Types: Cigarettes     Passive exposure: Never   . Smokeless tobacco: Never   Substance Use Topics   .  Alcohol use: Not Currently                                Ready to quit: Not Answered  Counseling given: Not Answered      Vital Signs (Current):   Vitals:    09/24/24 0839   BP: 110/81   Pulse: 66   Resp: 19   Temp: 36.7 C (98.1 F)   TempSrc: Oral   SpO2: 97%   Weight: 77.6 kg (171 lb)   Height: 1.575 m (5' 2)                                              BP Readings from Last 3 Encounters:   09/24/24 110/81   09/12/24 (!) 149/89   08/28/24 109/71       NPO Status: Time of last liquid consumption: 2000                        Time of last solid consumption: 2300                        Date of last liquid consumption: 09/23/24                        Date of last solid food consumption: 08/25/24    BMI:   Wt Readings from Last 3 Encounters:   09/24/24 77.6 kg (171 lb)   09/12/24 82.1 kg (181 lb 1 oz)   08/28/24 84.1 kg (185 lb 6 oz)     Body mass index is 31.28 kg/m.    CBC:   Lab Results   Component Value Date/Time    WBC 10.7 09/12/2024 06:20 AM    RBC 4.30 09/12/2024 06:20 AM    HGB 12.4 09/12/2024 06:20 AM    HCT 35.2 09/12/2024 06:20 AM    MCV 81.9 09/12/2024 06:20 AM    RDW 14.2 09/12/2024 06:20 AM    PLT 446 09/12/2024 06:20 AM       CMP:   Lab Results   Component Value Date/Time    NA 143 09/12/2024 06:20 AM    K 3.2 09/12/2024 06:20 AM    CL 105 09/12/2024 06:20 AM    CO2 27 09/12/2024 06:20 AM     BUN 9 09/12/2024 06:20 AM    CREATININE 0.51 09/12/2024 06:20 AM    GFRAA >60 01/01/2020 11:23 PM    AGRATIO 0.8 01/01/2020 11:23 PM    LABGLOM >90 09/12/2024 06:20 AM    GLUCOSE 91 09/12/2024 06:20 AM    CALCIUM 9.3 09/12/2024 06:20 AM    BILITOT 0.4 09/10/2024 06:15 AM    ALKPHOS 89 09/10/2024 06:15 AM    ALKPHOS 111 01/01/2020 11:23 PM    AST 16 09/10/2024 06:15 AM    ALT 41 09/10/2024 06:15 AM       POC Tests: No results for input(s): POCGLU, POCNA, POCK, POCCL, POCBUN, POCHEMO, POCHCT in the last 72 hours.    Coags: No results found for: PROTIME, INR, APTT    HCG (If Applicable): No results found for: PREGTESTUR, PREGSERUM, HCG, HCGQUANT     ABGs: No results found for: PHART, PO2ART, PCO2ART, HCO3ART, BEART, O2SATART     Type & Screen (  If Applicable):  No results found for: ABORH, LABANTI    Drug/Infectious Status (If Applicable):  No results found for: HIV, HEPCAB    COVID-19 Screening (If Applicable): No results found for: COVID19        Anesthesia Evaluation    Patient summary reviewed and Nursing notes reviewed      Airway:  Mallampati: II  TM distance: >3 FB   Neck ROM: full    Mouth opening: > = 3 FB   Dental:  normal exam         Pulmonary:   Negative Pulmonary ROS  breath sounds clear to auscultation           Cardiovascular:   Negative CV ROS            Rhythm: regular  Rate: normal                Neuro/Psych:    (+)   neuromuscular disease:                       GI/Hepatic/Renal:  Neg GI/Hepatic/Renal ROS          Endo/Other:  Negative Endo/Other ROS                    Abdominal:        Abdomen: soft.      Vascular:  negative vascular ROS.       Other Findings:            Anesthesia Plan      MAC     ASA 2             Anesthetic plan and risks discussed with patient.                      Alm JINNY Rattler, APRN - CRNA   09/24/2024

## 2024-09-24 NOTE — Interval H&P Note (Signed)
 Update History & Physical    The patient's History and Physical of September 24, 2024 was reviewed with the patient and I examined the patient. There was no change. The surgical site was confirmed by the patient and me.     Plan: The risks, benefits, expected outcome, and alternative to the recommended procedure have been discussed with the patient. Patient understands and wants to proceed with the procedure.     Electronically signed by Nivia Myriam Raddle, MD on 09/24/2024 at 9:48 AM

## 2024-09-24 NOTE — Discharge Instructions (Signed)
 For the next 12 hours you should not:   1. drive   2. drink alcohol   3. operate any machinery   4. engage in activities that require mental sharpness or manual dexterity such as     cooking   5. take any drugs other than those prescribed by a physician   6. make any legal or financial decisions    Call your doctor's office immediately, if there is is anything unusual:   1. increased and continuing rectal bleeding   2. fever   3. Unusual abdominal pain    Take it easy today and resume normal activity tomorrow.It is common to have gas and mild bloating for a few hours. Pain is NOT normal. You may be groggy off and on for a few hours.    Resume previous diet.        Levoy Geisen Myriam Raddle, MD  09/24/2024  10:43 AM

## 2024-09-24 NOTE — Op Note (Signed)
 Colonoscopy Procedure Note      Patient: Yolanda Weber MRN: [id number]  SSN: [id number]    Date of Birth: 05-20-1971  Age: 53 y.o.  Sex: female      Date of Procedure: 09/24/2024  Date/Time:  09/24/2024 10:32 AM       IMPRESSION:     1.  Generalized diverticulosis         RECOMMENDATIONS:     1) Increase fiber in diet   2) If diarrhea should get continue then we will give patient a trial of colestipol 1 g twice daily.  3) Repeat colonoscopy in 5 years.       INDICATION: Chronic diarrhea    PROCEDURE PERFORMED: Colonoscopy     DESCRIPTION OF PROCEDURE: An informed consent was obtained.  The patient was placed in left lateral position.  Perianal inspection and a digital rectal exam was performed.  Video colonoscope was introduced into the rectum and advanced under direct vision up to the terminal ileum.  With adequate insufflation and maneuvering of the withdrawing scope, the colonic mucosa was visualized carefully.  Retroflexion was performed in the rectum to see the anorectum and also in the ascending colon to look behind the folds.  Vital signs, pulse oximetry, single lead cardiac monitor were monitored throughout the procedure as the sedation was titrated to the desired effect ensuring patient comfort and safety.  The patient tolerated the procedure very well and was transferred to the recovery area. Following is the summary of findings: Patient had diverticulosis noted throughout the entire colon which was mild and moderate to small in size.  No other mucosal lesions were noted to the level of the cecum.      ENDOSCOPIST: Nivia Myriam Raddle, MD     ENDOSCOPE: Olympus video colonoscope     ASSISTANT:Circulator: Olden Grayce DEL, RN              Scrub Person First: Waddell Myrna CROME, RN     ANESTHESIA: TIVA      QUALITY OF PREPARATION: Good      FINDINGS:   Generalized diverticulosis        Complication:  None         EBL: None     SPECIMENS:   ID Type Source Tests Collected by Time Destination   1 : gastric antrum  Tissue Stomach SURGICAL PATHOLOGY Myriam Nivia Raddle., MD 09/24/2024 1010              Artavious Trebilcock Myriam Raddle, MD  September 24, 2024  10:32 AM

## 2024-09-24 NOTE — Op Note (Signed)
 EGD Procedure Note        Patient: Yolanda Weber MRN: [id number]  SSN: [id number]    Date of Birth: 11-11-1971  Age: 53 y.o.  Sex: female        Date/Time:  09/24/2024 10:38 AM         IMPRESSION:       Antral gastric  Distal esophagitis (grade 2)  Proximal esophageal diverticulum       RECOMMENDATIONS:    Check biopsy results  Continue the pantoprazole  40 mg twice daily.  With presence of a suspected proximal esophageal diverticulum may consider barium swallow to further evaluate.  Patient should avoid use of NSAIDs may be a major reason for the nausea and vomiting secondary to the gastritis.    Procedure: Esophagogastroduodenoscopy with cold biopsies    Indication: Nausea and vomiting    Endoscopist:  Sylva Overley Myriam Raddle, MD    Referring Provider:   [name], MD    History: The history and physical exam were reviewed and updated.     Endoscope: GIF H190 Olympus video endoscope    Extent of Exam: third portion of the duodenum    ASA: ASA 2 - Patient with mild systemic disease with no functional limitations    Anethesia/Sedation:  TIVA    Description of the procedure:   The procedure was discussed with the patient including risks, benefits, alternatives including risks of iv sedation, bleeding, perforation and aspiration.  A safety timeout was performed. The patient was placed in the left lateral decubitus position.  A bite block was placed.  The patient was using standard protocol.  The patients vital signs were monitored at all times including heart rate/rhythm, blood pressure and oxygen saturation.  The endoscope was then passed under direct visualization to the third portion of the duodenum.  The endoscope was then slowly withdrawn while visualizing the mucosa.  In the stomach a retroflexion was performed and gastric fundus and cardia visualized. The patient was then transferred to recovery in stable condition.                Findings:   Esophagus:The esophageal mucosa was inflamed in the  distal esophagus consistent with a grade 2 esophagitis.  In the proximal esophagus at approximately the 15 cm levels from the incisors there is presence of diverticulum with food material that was located and it.  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  Stomach: The gastric mucosa was inflamed throughout the gastric antrum.  Multiple biopsies taken there..   Duodenum: The duodenum mucosa was normal with no ulceration, mass, stricture and no evidence of villous atrophy.     Therapies: None    Specimens:   ID Type Source Tests Collected by Time Destination   1 : gastric antrum Tissue Stomach SURGICAL PATHOLOGY Myriam Nivia Raddle., MD 09/24/2024 1010         Assistants:   Circulator: Olden Grayce DEL, RN  Scrub Person First: Waddell Myrna CROME, RN    ZAO:[id number]    Complications:   None; patient tolerated the procedure well.     Implants: None    Discharge disposition:  Out of the recovery area when discharge criteria met         Kincaid Tiger Myriam Raddle, MD  September 24, 2024  10:38 AM

## 2024-09-28 ENCOUNTER — Encounter

## 2024-10-06 ENCOUNTER — Inpatient Hospital Stay
Admission: EM | Admit: 2024-10-06 | Discharge: 2024-10-10 | Disposition: A | Discharge status: Home / Self Care | Payer: Medicaid (Managed Care) | Admitting: Hospitalist

## 2024-10-06 ENCOUNTER — Emergency Department: Admit: 2024-10-06 | Payer: Medicaid (Managed Care) | Primary: Internal Medicine

## 2024-10-06 DIAGNOSIS — K529 Noninfective gastroenteritis and colitis, unspecified: Principal | ICD-10-CM

## 2024-10-06 DIAGNOSIS — K2970 Gastritis, unspecified, without bleeding: Secondary | ICD-10-CM

## 2024-10-06 LAB — CBC WITH AUTO DIFFERENTIAL
Basophils %: 0.4 % (ref 0.0–1.0)
Basophils Absolute: 0.05 K/UL (ref 0.00–0.10)
Eosinophils %: 0.2 % (ref 0.0–7.0)
Eosinophils Absolute: 0.02 K/UL (ref 0.00–0.40)
Hematocrit: 39 % (ref 35.0–47.0)
Hemoglobin: 13.8 g/dL (ref 11.5–16.0)
Immature Granulocytes %: 0.6 % — ABNORMAL HIGH (ref 0–0.5)
Immature Granulocytes Absolute: 0.08 K/UL — ABNORMAL HIGH (ref 0.00–0.04)
Lymphocytes %: 22.8 % (ref 12.0–49.0)
Lymphocytes Absolute: 2.99 K/UL (ref 0.80–3.50)
MCH: 28.9 pg (ref 26.0–34.0)
MCHC: 35.4 g/dL (ref 30.0–36.5)
MCV: 81.6 FL (ref 80.0–99.0)
MPV: 9.1 FL (ref 8.9–12.9)
Monocytes Absolute: 0.5 K/UL (ref 0.00–1.00)
Neutrophils Absolute: 9.49 K/UL — ABNORMAL HIGH (ref 1.80–8.00)
Nucleated RBCs: 0 /100{WBCs}
Platelets: 465 K/uL — ABNORMAL HIGH (ref 150–400)
RBC: 4.78 M/uL (ref 3.80–5.20)
RDW: 13.9 % (ref 11.5–14.5)
WBC: 13.1 K/uL — ABNORMAL HIGH (ref 3.6–11.0)
nRBC: 0 K/uL (ref 0.00–0.01)

## 2024-10-06 LAB — C-REACTIVE PROTEIN: CRP: <0.3 mg/dL (ref 0.0–0.5)

## 2024-10-06 LAB — MAGNESIUM: Magnesium: 1.8 mg/dL (ref 1.6–2.6)

## 2024-10-06 LAB — URINALYSIS WITH REFLEX TO CULTURE
Ketones, Urine: >80 mg/dL — AB
Protein, UA: 30 mg/dL — AB
Specific Gravity, UA: 1.025 (ref 1.003–1.030)
Urobilinogen, Urine: 0.2 EU/dL (ref 0.2–1.0)

## 2024-10-06 LAB — COMPREHENSIVE METABOLIC PANEL
ALT: 27 U/L (ref 10–35)
AST: 21 U/L (ref 10–35)
Albumin/Globulin Ratio: 1 — ABNORMAL LOW (ref 1.1–2.2)
Albumin: 3.6 g/dL (ref 3.5–5.2)
Anion Gap: 14 mmol/L (ref 2–14)
CO2: 23 mmol/L (ref 20–29)
Globulin: 3.8 g/dL (ref 2.0–4.0)
Potassium: 3.5 mmol/L (ref 3.5–5.1)
Sodium: 141 mmol/L (ref 136–145)
Total Bilirubin: 0.4 mg/dL (ref 0.0–1.2)
Total Protein: 7.4 g/dL (ref 6.4–8.3)

## 2024-10-06 LAB — PROCALCITONIN: Procalcitonin: <0.02 ng/mL

## 2024-10-06 LAB — BILIRUBIN, CONFIRMATORY: Bilirubin Confirmation, UA: NEGATIVE

## 2024-10-06 MED ORDER — NORMAL SALINE FLUSH 0.9 % IV SOLN
0.9 | INTRAVENOUS | Status: DC | PRN
Start: 2024-10-06 — End: 2024-10-10
  Administered 2024-10-08 – 2024-10-10 (×3): 10 mL via INTRAVENOUS

## 2024-10-06 MED ORDER — POLYETHYLENE GLYCOL 3350 17 G PO PACK
17 | Freq: Every day | ORAL | Status: DC | PRN
Start: 2024-10-06 — End: 2024-10-10

## 2024-10-06 MED ORDER — SODIUM CHLORIDE 0.9 % IV BOLUS
0.9 | Freq: Once | INTRAVENOUS | Status: AC
Start: 2024-10-06 — End: 2024-10-06
  Administered 2024-10-06: 13:00:00 1000 mL via INTRAVENOUS

## 2024-10-06 MED ORDER — METRONIDAZOLE 500 MG/100ML IV SOLN
500 | Freq: Three times a day (TID) | INTRAVENOUS | Status: DC
Start: 2024-10-06 — End: 2024-10-10
  Administered 2024-10-06 – 2024-10-10 (×12): 500 mg via INTRAVENOUS

## 2024-10-06 MED ORDER — POTASSIUM CHLORIDE IN NACL 20-0.9 MEQ/L-% IV SOLN
20-0.9 | INTRAVENOUS | Status: DC
Start: 2024-10-06 — End: 2024-10-09
  Administered 2024-10-06 – 2024-10-09 (×6): via INTRAVENOUS

## 2024-10-06 MED ORDER — POTASSIUM CHLORIDE 10 MEQ/100ML IV SOLN
10 | INTRAVENOUS | Status: DC | PRN
Start: 2024-10-06 — End: 2024-10-10

## 2024-10-06 MED ORDER — MAGNESIUM SULFATE 2000 MG/50 ML IVPB PREMIX
2 | INTRAVENOUS | Status: DC | PRN
Start: 2024-10-06 — End: 2024-10-10

## 2024-10-06 MED ORDER — MORPHINE SULFATE (PF) 4 MG/ML IJ SOLN
4 | INTRAMUSCULAR | Status: AC
Start: 2024-10-06 — End: 2024-10-06
  Administered 2024-10-06: 15:00:00 4 mg via INTRAVENOUS

## 2024-10-06 MED ORDER — IOPAMIDOL 76 % IV SOLN
76 | Freq: Once | INTRAVENOUS | Status: AC | PRN
Start: 2024-10-06 — End: 2024-10-06
  Administered 2024-10-06: 14:00:00 100 mL via INTRAVENOUS

## 2024-10-06 MED ORDER — ENOXAPARIN SODIUM 40 MG/0.4ML IJ SOSY
40 | Freq: Every day | INTRAMUSCULAR | Status: DC
Start: 2024-10-06 — End: 2024-10-10
  Administered 2024-10-06 – 2024-10-10 (×5): 40 mg via SUBCUTANEOUS

## 2024-10-06 MED ORDER — ONDANSETRON HCL 4 MG/2ML IJ SOLN
4 | Freq: Four times a day (QID) | INTRAMUSCULAR | Status: DC | PRN
Start: 2024-10-06 — End: 2024-10-10

## 2024-10-06 MED ORDER — ALUM & MAG HYDROXIDE-SIMETH 200-200-20 MG/5ML PO SUSP
200-200-20 | Freq: Four times a day (QID) | ORAL | Status: DC | PRN
Start: 2024-10-06 — End: 2024-10-10

## 2024-10-06 MED ORDER — NICOTINE 14 MG/24HR TD PT24
14 | Freq: Every day | TRANSDERMAL | Status: DC
Start: 2024-10-06 — End: 2024-10-10
  Administered 2024-10-06 – 2024-10-10 (×5): 1 via TRANSDERMAL

## 2024-10-06 MED ORDER — SODIUM CHLORIDE 0.9 % IV SOLN
0.9 | INTRAVENOUS | Status: DC | PRN
Start: 2024-10-06 — End: 2024-10-10

## 2024-10-06 MED ORDER — KETOROLAC TROMETHAMINE 30 MG/ML IJ SOLN
30 | Freq: Four times a day (QID) | INTRAMUSCULAR | Status: DC
Start: 2024-10-06 — End: 2024-10-06

## 2024-10-06 MED ORDER — MORPHINE SULFATE (PF) 10 MG/ML IV SOLN
10 | Freq: Once | INTRAVENOUS | Status: AC
Start: 2024-10-06 — End: 2024-10-06
  Administered 2024-10-06: 13:00:00 7.7 mg/kg via INTRAVENOUS

## 2024-10-06 MED ORDER — CULTURELLE PO CAPS
Freq: Every day | ORAL | Status: DC
Start: 2024-10-06 — End: 2024-10-10
  Administered 2024-10-07 – 2024-10-10 (×4): 1 via ORAL

## 2024-10-06 MED ORDER — ONDANSETRON HCL 4 MG/2ML IJ SOLN
4 | Freq: Once | INTRAMUSCULAR | Status: AC
Start: 2024-10-06 — End: 2024-10-06
  Administered 2024-10-06: 13:00:00 4 mg via INTRAVENOUS

## 2024-10-06 MED ORDER — ACETAMINOPHEN 325 MG PO TABS
325 | Freq: Four times a day (QID) | ORAL | Status: DC | PRN
Start: 2024-10-06 — End: 2024-10-09
  Administered 2024-10-07: 08:00:00 650 mg via ORAL

## 2024-10-06 MED ORDER — NORMAL SALINE FLUSH 0.9 % IV SOLN
0.9 | Freq: Two times a day (BID) | INTRAVENOUS | Status: DC
Start: 2024-10-06 — End: 2024-10-10
  Administered 2024-10-07 – 2024-10-10 (×7): 10 mL via INTRAVENOUS

## 2024-10-06 MED ORDER — LEVOFLOXACIN IN D5W 750 MG/150ML IV SOLN
750 | INTRAVENOUS | Status: DC
Start: 2024-10-06 — End: 2024-10-09
  Administered 2024-10-06 – 2024-10-08 (×3): 750 mg via INTRAVENOUS

## 2024-10-06 MED ORDER — KETOROLAC TROMETHAMINE 15 MG/ML IJ SOLN
15 | Freq: Once | INTRAMUSCULAR | Status: DC
Start: 2024-10-06 — End: 2024-10-06

## 2024-10-06 MED ORDER — SODIUM CHLORIDE (PF) 0.9 % IJ SOLN
0.9 | Freq: Two times a day (BID) | INTRAMUSCULAR | Status: DC
Start: 2024-10-06 — End: 2024-10-09
  Administered 2024-10-06 – 2024-10-09 (×7): 40 mg via INTRAVENOUS

## 2024-10-06 MED ORDER — ONDANSETRON 4 MG PO TBDP
4 | Freq: Three times a day (TID) | ORAL | Status: DC | PRN
Start: 2024-10-06 — End: 2024-10-10
  Administered 2024-10-08: 08:00:00 4 mg via ORAL

## 2024-10-06 MED ORDER — POTASSIUM CHLORIDE CRYS ER 20 MEQ PO TBCR
20 | ORAL | Status: DC | PRN
Start: 2024-10-06 — End: 2024-10-10

## 2024-10-06 MED ORDER — ACETAMINOPHEN 650 MG RE SUPP
650 | Freq: Four times a day (QID) | RECTAL | Status: DC | PRN
Start: 2024-10-06 — End: 2024-10-09

## 2024-10-06 MED ORDER — POTASSIUM BICARB-CITRIC ACID 20 MEQ PO TBEF
20 | ORAL | Status: DC | PRN
Start: 2024-10-06 — End: 2024-10-10

## 2024-10-06 MED ORDER — HYDROMORPHONE HCL 1 MG/ML IJ SOLN
1 | INTRAMUSCULAR | Status: DC | PRN
Start: 2024-10-06 — End: 2024-10-09
  Administered 2024-10-06 – 2024-10-09 (×11): 1 mg via INTRAVENOUS

## 2024-10-06 MED FILL — PANTOPRAZOLE SODIUM 40 MG IV SOLR: 40 mg | INTRAVENOUS | Qty: 40

## 2024-10-06 MED FILL — LEVOFLOXACIN IN D5W 750 MG/150ML IV SOLN: 750 MG/150ML | INTRAVENOUS | Qty: 150

## 2024-10-06 MED FILL — DILAUDID 1 MG/ML IJ SOLN: 1 mg/mL | INTRAMUSCULAR | Qty: 1

## 2024-10-06 MED FILL — ISOVUE-370 76 % IV SOLN: 76 % | INTRAVENOUS | Qty: 100

## 2024-10-06 MED FILL — METRONIDAZOLE 500 MG/100ML IV SOLN: 500 MG/100ML | INTRAVENOUS | Qty: 100

## 2024-10-06 MED FILL — MORPHINE SULFATE 10 MG/ML IV SOLN: 10 mg/mL | INTRAVENOUS | Qty: 1

## 2024-10-06 MED FILL — KETOROLAC TROMETHAMINE 30 MG/ML IJ SOLN: 30 mg/mL | INTRAMUSCULAR | Qty: 1

## 2024-10-06 MED FILL — SODIUM CHLORIDE 0.9 % IV SOLN: 0.9 % | INTRAVENOUS | Qty: 1000

## 2024-10-06 MED FILL — MORPHINE SULFATE 4 MG/ML IJ SOLN: 4 mg/mL | INTRAMUSCULAR | Qty: 1

## 2024-10-06 MED FILL — NORMAL SALINE FLUSH 0.9 % IV SOLN: 0.9 % | INTRAVENOUS | Qty: 40

## 2024-10-06 MED FILL — KETOROLAC TROMETHAMINE 15 MG/ML IJ SOLN: 15 mg/mL | INTRAMUSCULAR | Qty: 1

## 2024-10-06 MED FILL — NICOTINE 14 MG/24HR TD PT24: 14 mg/(24.h) | TRANSDERMAL | Qty: 1

## 2024-10-06 MED FILL — ONDANSETRON HCL 4 MG/2ML IJ SOLN: 4 MG/2ML | INTRAMUSCULAR | Qty: 2

## 2024-10-06 MED FILL — POTASSIUM CHLORIDE IN NACL 20-0.9 MEQ/L-% IV SOLN: 20-0.9 MEQ/L-% | INTRAVENOUS | Qty: 1000

## 2024-10-06 MED FILL — ENOXAPARIN SODIUM 40 MG/0.4ML IJ SOSY: 40 MG/0.4ML | INTRAMUSCULAR | Qty: 0.4

## 2024-10-06 NOTE — ED Notes (Signed)
"  Pt report given to Millie at [facility] ([facility]). Pt to be transported momentarily.   "

## 2024-10-06 NOTE — H&P (Signed)
 "     History and Physical  Dr Alverda Kaiser MD      Chief Complaints:     Chief Complaint   Patient presents with    Abdominal Pain    Vomiting         Subjective:     Yolanda Weber is a 53 y.o. female followed by [name], MD and GI Dr. Myriam has a past medical history of Angiomyolipoma of both kidneys, Bulging lumbar disc, Diverticulosis, Fibromyalgia, Sciatica, and Sickle cell trait.  With recent gastritis and grade 2 esophagitis on EGD done on 7/1 and diverticulosis on colonoscopy.  Presents once again with similar issues with abdominal pain and associated nausea vomiting for which she describes the pain as sharp.  She said the symptoms started on Saturday morning but was very mild and was off-and-on and had increased after eating dinner continue to have pain but once again eased off and was able to sleep Sunday she attempted to rest and just did some errands decreased her p.o. intake but noted that the sharp abdominal pain became more consistent she noted diarrhea after every meal on Saturday and had fairly continuous episodes on Sunday and noted some mild blood with the diarrhea.  She also says she had vomited x 2 on Sunday.  She continued to have progression of the symptoms and came to the emergency room to get evaluated.  In the ER had elevated blood pressure 163/100 heart rate of 88 afebrile at 97.8 O2 sat 97% on room air.  BMP was within normal limits white blood count elevated 13.1 platelets elevated 465.  CT shows findings for nonspecific colitis involving the descending and rectosigmoid colon.  Patient refused Toradol  dosing was given initially 4 mg of morphine  but followed by a 7.7 mg IV dose of morphine  Zofran  4 mg 1 L bolus and was then referred for monitoring for acute colitis/diverticulitis    Discussed management with the ED provider Dr. Helene Sharps, MD and agree with the plan for hospitalization       Past Medical History:   Diagnosis Date    Angiomyolipoma of both kidneys      Bulging lumbar disc     Diverticulosis     Fibromyalgia     Sciatica     Sickle cell trait       Past Surgical History:   Procedure Laterality Date    CHOLECYSTECTOMY      COLONOSCOPY      COLONOSCOPY N/A 09/24/2024    COLONOSCOPY performed by Myriam Nivia Raddle., MD at [facility] ENDOSCOPY    CYST REMOVAL Right     Breast    DILATION AND CURETTAGE OF UTERUS      UPPER GASTROINTESTINAL ENDOSCOPY N/A 09/24/2024    ESOPHAGOGASTRODUODENOSCOPY BIOPSY performed by Myriam Nivia Raddle., MD at [facility] ENDOSCOPY     Family History   Problem Relation Age of Onset    Sickle Cell Anemia Mother     COPD Mother     High Blood Pressure Father       Social History     Tobacco Use    Smoking status: Some Days     Types: Cigarettes     Passive exposure: Never    Smokeless tobacco: Never   Substance Use Topics    Alcohol use: Defer       Prior to Admission medications   Medication Sig Start Date End Date Taking? Authorizing Provider   nicotine  (NICODERM CQ ) 14 MG/24HR Place 1  patch onto the skin daily 09/13/24  Yes Zaw, Kyaw T, MD   sucralfate  (CARAFATE ) 1 GM tablet Take 1 tablet by mouth 4 times daily (before meals and nightly) 09/12/24  Yes Zaw, Kyaw T, MD   ondansetron  (ZOFRAN -ODT) 4 MG disintegrating tablet Take 1 tablet by mouth 3 times daily as needed for Nausea or Vomiting 09/12/24  Yes Zaw, Kyaw T, MD   meloxicam  (MOBIC ) 15 MG tablet Take 1 tablet by mouth daily 04/29/24  Yes [provider]   clonazePAM  (KLONOPIN ) 1 MG tablet Take 1 tablet by mouth 2 times daily.   Yes Automatic Reconciliation, Ar   pantoprazole  (PROTONIX ) 40 MG tablet Take 1 tablet by mouth 2 times daily (with meals) 05/12/24 07/11/24  Avi Alverda SAUNDERS, MD     Allergies   Allergen Reactions    Shellfish Allergy Hives    Ibuprofen Nausea And Vomiting        Review of Systems:  Review of Systems   Constitutional: Negative.    HENT: Negative.     Eyes: Negative.    Respiratory: Negative.     Cardiovascular: Negative.    Gastrointestinal:  Positive for abdominal  pain, diarrhea, nausea and vomiting.   Endocrine: Negative.    Genitourinary: Negative.    Musculoskeletal: Negative.    Skin: Negative.    Allergic/Immunologic: Negative.    Neurological: Negative.    Hematological: Negative.    Psychiatric/Behavioral: Negative.            Objective:     Vitals:  BP (!) 150/85   Pulse 66   Temp 98.6 F (37 C)   Resp 16   Ht 1.575 m (5' 2)   Wt 77.1 kg (170 lb)   SpO2 98%   BMI 31.09 kg/m     Physical Exam:  General: Alert, cooperative, no distress.  Head:  Normocephalic, without obvious abnormality, atraumatic.  Eyes:  Conjunctivae/corneas clear. Pupils equal, round, reactive to light. Extraocular movements intact.  Lungs:  Clear to auscultation bilaterally, no wheezes, crackles  Chest wall: No tenderness or deformity.  Heart:  Regular rate and rhythm, S1, S2 normal, no murmur, click, rub, or gallop.  Abdomen:   Soft, left upper and lower quadrant tenderness mild guarding no rebound bowel sounds normal. No masses. No organomegaly.  Back:  No spine tenderness to palpation  Extremities: Extremities normal, atraumatic, no cyanosis or edema.  Pulses: Symmetric all extremities.  Skin: Skin color, texture, turgor normal.   Lymph nodes: Cervical nodes normal.  Neurologic: CNII-XII intact. Normal strength, sensation, and reflexes throughout.      Labs:  Recent Results (from the past 24 hours)   Comprehensive Metabolic Panel    Collection Time: 10/06/24  9:15 AM   Result Value Ref Range    Sodium 141 136 - 145 mmol/L    Potassium 3.5 3.5 - 5.1 mmol/L    Chloride 104 98 - 107 mmol/L    CO2 23 20 - 29 mmol/L    Anion Gap 14 2 - 14 mmol/L    Glucose 112 (H) 65 - 100 mg/dL    BUN 11 6 - 20 mg/dL    Creatinine 9.35 9.39 - 1.00 mg/dL    BUN/Creatinine Ratio 18 12 - 20      Est, Glom Filt Rate >90 >59 ml/min/1.80m2    Calcium 9.6 8.6 - 10.0 mg/dL    Total Bilirubin 0.4 0.0 - 1.2 mg/dL    AST 21 10 - 35 U/L  ALT 27 10 - 35 U/L    Alk Phosphatase 103 35 - 104 U/L    Total Protein 7.4 6.4  - 8.3 g/dL    Albumin 3.6 3.5 - 5.2 g/dL    Globulin 3.8 2.0 - 4.0 g/dL    Albumin/Globulin Ratio 1.0 (L) 1.1 - 2.2     CBC with Auto Differential    Collection Time: 10/06/24  9:15 AM   Result Value Ref Range    WBC 13.1 (H) 3.6 - 11.0 K/uL    RBC 4.78 3.80 - 5.20 M/uL    Hemoglobin 13.8 11.5 - 16.0 g/dL    Hematocrit 60.9 64.9 - 47.0 %    MCV 81.6 80.0 - 99.0 FL    MCH 28.9 26.0 - 34.0 PG    MCHC 35.4 30.0 - 36.5 g/dL    RDW 86.0 88.4 - 85.4 %    Platelets 465 (H) 150 - 400 K/uL    MPV 9.1 8.9 - 12.9 FL    Nucleated RBCs 0.0 0.0 PER 100 WBC    nRBC 0.00 0.00 - 0.01 K/uL    Neutrophils % 72.2 32.0 - 75.0 %    Lymphocytes % 22.8 12.0 - 49.0 %    Monocytes % 3.8 (L) 5.0 - 13.0 %    Eosinophils % 0.2 0.0 - 7.0 %    Basophils % 0.4 0.0 - 1.0 %    Immature Granulocytes % 0.6 (H) 0 - 0.5 %    Neutrophils Absolute 9.49 (H) 1.80 - 8.00 K/UL    Lymphocytes Absolute 2.99 0.80 - 3.50 K/UL    Monocytes Absolute 0.50 0.00 - 1.00 K/UL    Eosinophils Absolute 0.02 0.00 - 0.40 K/UL    Basophils Absolute 0.05 0.00 - 0.10 K/UL    Immature Granulocytes Absolute 0.08 (H) 0.00 - 0.04 K/UL    Differential Type AUTOMATED     Magnesium     Collection Time: 10/06/24  9:15 AM   Result Value Ref Range    Magnesium  1.8 1.6 - 2.6 mg/dL   Procalcitonin    Collection Time: 10/06/24  9:15 AM   Result Value Ref Range    Procalcitonin <0.02 ng/mL   C-Reactive Protein    Collection Time: 10/06/24  9:15 AM   Result Value Ref Range    CRP <0.3 0.0 - 0.5 mg/dL   Urinalysis with Reflex to Culture    Collection Time: 10/06/24  9:22 AM    Specimen: Urine   Result Value Ref Range    Color, UA Yellow/Straw      Appearance Clear Clear      Specific Gravity, UA 1.025 1.003 - 1.030      pH, Urine 6.0 5.0 - 8.0      Protein, UA 30 (A) Negative mg/dL    Glucose, Ur Negative Negative mg/dL    Ketones, Urine >19 (A) Negative mg/dL    Blood, Urine Large (A) Negative      Urobilinogen, Urine 0.2 0.2 - 1.0 EU/dL    Nitrite, Urine Negative Negative      Leukocyte  Esterase, Urine Negative Negative      WBC, UA 0-4 0 - 5 /hpf    RBC, UA 0-5 0 - 3 /hpf    BACTERIA, URINE 2+ (A) Negative /hpf    Urine Culture if Indicated Culture not indicated by UA result Culture not indicated by UA result     Bilirubin, Confirmatory    Collection Time: 10/06/24  9:22 AM   Result  Value Ref Range    Bilirubin Confirmation, UA Negative Negative         Imaging:  CT ABDOMEN PELVIS W IV CONTRAST Additional Contrast? None  Result Date: 10/06/2024  1.  Findings of a nonspecific colitis involving the descending and rectosigmoid colon, similar to prior. 2.  Subcentimeter right retroareolar mass, possibly an intramammary lymph node, for which mammographic correlation is recommended. 3.  Numerous bilateral renal angiomyolipomas. 4.  Liver hemangioma. 5.  Pedunculated uterine fibroid. Electronically signed by Baby Stank       Assessment & Plan:     Hospital Problems           Last Modified POA    * (Principal) Acute colitis 10/06/2024 Yes      Acute diverticulitis  -Recent colonoscopy on 10/1 showed diverticulosis and current CT shows sigmoid to rectum colitis  -Discussed with Dr. Myriam we will treat similarly as most recent admission with Levaquin  750 mg daily and Flagyl  500 mg IV every 8 hours  -Clear liquid diet  -Mild leukocytosis at 13.1  -Follow-up procalcitonin  -Stool for enteric bacteria  -Status post morphine  high dose x 2 in the emergency room continue Dilaudid  1 mg IV every 3 hours as needed for moderate to severe pain  -Daily probiotic  -Continue current IV fluids  -GI consult for recommendations    Gastritis  -Recent EGD showed antral gastritis and grade 2 esophagitis  -Will continue IV Protonix  twice daily    Tobacco dependence  - Nicotine  patch 14 mg obtain daily    Fibromyalgia  - Attempt to restart home medications    DVT prophylaxis  - Lovenox     CODE STATUS: Full code  Designates her father [employer] Mcjunkins as medical contact    Discussion/MDM: Patient with multiple medical  comorbidities, each with high likelihood for morbidity and mortality if left untreated.   I have reviewed patient's presenting subjective and objective findings, as well as all laboratory studies, imaging studies, and vital signs to date as well as treatment rendered and patient's response to those treatments.  In addition, prior medical, surgical and relevant social and family histories were reviewed. I have discussed management plan with patient/family and with nursing staff.      Toxic drug monitoring:    This is dictation was done by dragon, advertising account planner. Quite often unanticipated grammatical, syntax, homophones and other interpretive errors or inadvertently transcribed by the computer software. Please excuse errors that have escaped final proofreading. Thank you.           Electronically signed by Alverda JONELLE Kaiser, MD on 10/06/2024 at 4:08 PM  "

## 2024-10-06 NOTE — Plan of Care (Signed)
"    Problem: Pain  Goal: Verbalizes/displays adequate comfort level or baseline comfort level  Outcome: Progressing     "

## 2024-10-06 NOTE — ED Triage Notes (Signed)
"  Patient reports abdominal pain and vomiting that started Saturday and has been intermittent until last night patient reports taking nausea medicine with minimal relief  "

## 2024-10-06 NOTE — ED Provider Notes (Addendum)
 "[facility]  EMERGENCY DEPARTMENT  EMERGENCY DEPARTMENT ENCOUNTER      Pt Name: Yolanda Weber  MRN: [id number]  Birthdate 07-19-1971  Date of evaluation: 10/06/2024  Provider: Helene Sharps, MD    CHIEF COMPLAINT       Chief Complaint   Patient presents with    Abdominal Pain    Vomiting         HISTORY OF PRESENT ILLNESS   (Location/Symptom, Timing/Onset, Context/Setting, Quality, Duration, Modifying Factors, Severity)  Note limiting factors.   Yolanda Weber is a 53 y.o. female who presents to the emergency department with left lower quadrant abdominal pain beginning 2 days ago.  Intermittent severity much worse this morning; sensation of fever last night.  Loose stools 1 with it just small amount of blood.  History of diverticulitis 4 episodes since May 2025 no urinary symptoms.    Colonoscopy performed 08/28/2024 by Dr. Jonette report not available patient states showed diverticular disease primarily on the left side.  HPI    Nursing Notes were reviewed.    REVIEW OF SYSTEMS    (2-9 systems for level 4, 10 or more for level 5)     Review of Systems   Constitutional: Negative.    HENT: Negative.     Eyes: Negative.    Respiratory: Negative.     Cardiovascular: Negative.    Gastrointestinal:  Positive for abdominal pain, nausea and vomiting. Negative for abdominal distention, anal bleeding, blood in stool, constipation and diarrhea.   Endocrine: Negative.    Genitourinary: Negative.    Musculoskeletal: Negative.    Allergic/Immunologic: Negative.    Neurological: Negative.    Hematological: Negative.    Psychiatric/Behavioral: Negative.         Except as noted above the remainder of the review of systems was reviewed and negative.       PAST MEDICAL HISTORY     Past Medical History:   Diagnosis Date    Angiomyolipoma of both kidneys     Bulging lumbar disc     Diverticulosis     Fibromyalgia     Sciatica     Sickle cell trait          SURGICAL HISTORY       Past Surgical History:   Procedure Laterality Date     CHOLECYSTECTOMY      COLONOSCOPY      COLONOSCOPY N/A 09/24/2024    COLONOSCOPY performed by Myriam Nivia Raddle., MD at [facility] ENDOSCOPY    CYST REMOVAL Right     Breast    DILATION AND CURETTAGE OF UTERUS      UPPER GASTROINTESTINAL ENDOSCOPY N/A 09/24/2024    ESOPHAGOGASTRODUODENOSCOPY BIOPSY performed by Myriam Nivia Raddle., MD at [facility] ENDOSCOPY         CURRENT MEDICATIONS       Previous Medications    CLONAZEPAM  (KLONOPIN ) 1 MG TABLET    Take 1 tablet by mouth 2 times daily.    MELOXICAM  (MOBIC ) 15 MG TABLET    Take 1 tablet by mouth daily    NICOTINE  (NICODERM CQ ) 14 MG/24HR    Place 1 patch onto the skin daily    ONDANSETRON  (ZOFRAN -ODT) 4 MG DISINTEGRATING TABLET    Take 1 tablet by mouth 3 times daily as needed for Nausea or Vomiting    PANTOPRAZOLE  (PROTONIX ) 40 MG TABLET    Take 1 tablet by mouth 2 times daily (with meals)    SUCRALFATE  (CARAFATE ) 1 GM TABLET    Take 1 tablet  by mouth 4 times daily (before meals and nightly)       ALLERGIES     Shellfish allergy and Ibuprofen    FAMILY HISTORY       Family History   Problem Relation Age of Onset    Sickle Cell Anemia Mother     COPD Mother     High Blood Pressure Father           SOCIAL HISTORY       Social History     Socioeconomic History    Marital status: Single     Spouse name: None    Number of children: None    Years of education: None    Highest education level: None   Tobacco Use    Smoking status: Some Days     Types: Cigarettes     Passive exposure: Never    Smokeless tobacco: Never   Vaping Use    Vaping status: Never Used   Substance and Sexual Activity    Alcohol use: Defer    Drug use: Never     Social Drivers of Health     Food Insecurity: No Food Insecurity (09/09/2024)    Hunger Vital Sign     Worried About Running Out of Food in the Last Year: Never true     Ran Out of Food in the Last Year: Never true   Transportation Needs: No Transportation Needs (09/09/2024)    PRAPARE - Therapist, Art (Medical): No     Lack  of Transportation (Non-Medical): No    Received from [employer] Stability: Low Risk  (09/09/2024)    Housing Stability Vital Sign     Unable to Pay for Housing in the Last Year: No     Number of Times Moved in the Last Year: 1     Homeless in the Last Year: No       SCREENINGS         Glasgow Coma Scale  Eye Opening: Spontaneous  Best Verbal Response: Oriented  Best Motor Response: Obeys commands  Glasgow Coma Scale Score: 15                     CIWA Assessment  BP: (!) 163/100  Pulse: 88                 PHYSICAL EXAM    (up to 7 for level 4, 8 or more for level 5)     ED Triage Vitals [10/06/24 0836]   BP Girls Systolic BP Percentile Girls Diastolic BP Percentile Boys Systolic BP Percentile Boys Diastolic BP Percentile Temp Temp src Pulse   (!) 163/100 -- -- -- -- 97.8 F (36.6 C) -- 88      Respirations SpO2 Height Weight - Scale       18 97 % 1.575 m (5' 2) 77.1 kg (170 lb)         Middle-age AA female in moderate pain tearful  Physical Exam  Vitals and nursing note reviewed.   Constitutional:       General: She is not in acute distress.     Appearance: Normal appearance. She is not ill-appearing or toxic-appearing.   HENT:      Head: Normocephalic and atraumatic.      Nose: Nose normal.      Mouth/Throat:      Pharynx: No oropharyngeal exudate or posterior oropharyngeal  erythema.   Eyes:      Extraocular Movements: Extraocular movements intact.      Conjunctiva/sclera: Conjunctivae normal.   Cardiovascular:      Rate and Rhythm: Normal rate and regular rhythm.      Pulses: Normal pulses.      Heart sounds: Normal heart sounds. No murmur heard.  Pulmonary:      Effort: No respiratory distress.      Breath sounds: No wheezing, rhonchi or rales.   Abdominal:      General: Abdomen is flat. Bowel sounds are normal. There is no distension.      Tenderness: There is abdominal tenderness in the left lower quadrant. There is no right CVA tenderness, left CVA tenderness, guarding or  rebound. Negative signs include Murphy's sign and McBurney's sign.      Hernia: No hernia is present.   Musculoskeletal:         General: No swelling, tenderness, deformity or signs of injury. Normal range of motion.      Cervical back: Normal range of motion and neck supple. No rigidity or tenderness.      Right lower leg: No edema.      Left lower leg: No edema.   Skin:     General: Skin is warm and dry.      Capillary Refill: Capillary refill takes less than 2 seconds.      Findings: No erythema or rash.   Neurological:      General: No focal deficit present.      Mental Status: She is alert and oriented to person, place, and time.      Cranial Nerves: No cranial nerve deficit.      Sensory: No sensory deficit.      Motor: No weakness.      Coordination: Coordination normal.      Gait: Gait normal.   Psychiatric:         Mood and Affect: Mood normal.         Behavior: Behavior normal.         Thought Content: Thought content normal.         DIAGNOSTIC RESULTS     EKG: All EKG's are interpreted by the Emergency Department Physician who either signs or Co-signs this chart in the absence of a cardiologist.      RADIOLOGY:   Non-plain film images such as CT, Ultrasound and MRI are read by the radiologist. Plain radiographic images are visualized and preliminarily interpreted by the emergency physician with the below findings:        Interpretation per the Radiologist below, if available at the time of this note:    CT ABDOMEN PELVIS W IV CONTRAST Additional Contrast? None    (Results Pending)         ED BEDSIDE ULTRASOUND:   Performed by ED Physician - none    LABS:  Labs Reviewed   COMPREHENSIVE METABOLIC PANEL   CBC WITH AUTO DIFFERENTIAL   URINALYSIS WITH REFLEX TO CULTURE       All other labs were within normal range or not returned as of this dictation.    EMERGENCY DEPARTMENT COURSE and DIFFERENTIAL DIAGNOSIS/MDM:   Vitals:    Vitals:    10/06/24 0836   BP: (!) 163/100   Pulse: 88   Resp: 18   Temp: 97.8 F  (36.6 C)   SpO2: 97%   Weight: 77.1 kg (170 lb)   Height: 1.575 m (5' 2)  Medical Decision Making  Amount and/or Complexity of Data Reviewed  Labs: ordered.  Radiology: ordered.    Risk  Prescription drug management.  Decision regarding hospitalization.    CT of the abdomen shows nonspecific colitis in the descending and rectosigmoid area corresponding to the patient's pain mildly elevated white count still in pain will discuss with hospitalist regarding admission        REASSESSMENT          CRITICAL CARE TIME   Total Critical Care time was      minutes, excluding separately reportable procedures.  There was a high probability of clinically significant/life threatening deterioration in the patient's condition which required my urgent intervention.   CONSULTS:  Consult with hospitalist Dr. Avi will admit    PROCEDURES:  Unless otherwise noted below, none     Procedures    FINAL IMPRESSION    No diagnosis found.      DISPOSITION/PLAN   DISPOSITION                 PATIENT REFERRED TO:  No follow-up provider specified.    DISCHARGE MEDICATIONS:  New Prescriptions    No medications on file     Controlled Substances Monitoring:          No data to display                (Please note that portions of this note were completed with a voice recognition program.  Efforts were made to edit the dictations but occasionally words are mis-transcribed.)    Helene Sharps, MD (electronically signed)  Attending Emergency Physician           Sharps Helene BIRCH, MD  10/06/24 0935       Sharps Helene BIRCH, MD  10/06/24 1820    "

## 2024-10-06 NOTE — ED Notes (Signed)
 Pt attempting urine sample at this time

## 2024-10-07 LAB — CBC WITH AUTO DIFFERENTIAL
Basophils %: 0.3 % (ref 0.0–1.0)
Basophils Absolute: 0.03 K/UL (ref 0.00–0.10)
Eosinophils %: 2.4 % (ref 0.0–7.0)
Eosinophils Absolute: 0.27 K/UL (ref 0.00–0.40)
Hematocrit: 35.5 % (ref 35.0–47.0)
Hemoglobin: 12.5 g/dL (ref 11.5–16.0)
Immature Granulocytes %: 0.3 % (ref 0–0.5)
Immature Granulocytes Absolute: 0.03 K/UL (ref 0.00–0.04)
Lymphocytes %: 46 % (ref 12.0–49.0)
MCH: 28.9 pg (ref 26.0–34.0)
MCHC: 35.2 g/dL (ref 30.0–36.5)
MPV: 10.5 FL (ref 8.9–12.9)
Monocytes %: 6.9 % (ref 5.0–13.0)
Monocytes Absolute: 0.79 K/UL (ref 0.00–1.00)
Neutrophils %: 44.1 % (ref 32.0–75.0)
Neutrophils Absolute: 5.03 K/UL (ref 1.80–8.00)
Nucleated RBCs: 0 /100{WBCs}
RBC: 4.32 M/uL (ref 3.80–5.20)
RDW: 14 % (ref 11.5–14.5)

## 2024-10-07 LAB — COMPREHENSIVE METABOLIC PANEL
Albumin/Globulin Ratio: 0.9 — ABNORMAL LOW (ref 1.1–2.2)
Albumin: 3 g/dL — ABNORMAL LOW (ref 3.5–5.2)
Anion Gap: 11 mmol/L (ref 2–14)
CO2: 24 mmol/L (ref 20–29)
Chloride: 105 mmol/L (ref 98–107)
Creatinine: 0.53 mg/dL — ABNORMAL LOW (ref 0.60–1.00)
Potassium: 3.8 mmol/L (ref 3.5–5.1)
Sodium: 140 mmol/L (ref 136–145)

## 2024-10-07 LAB — C-REACTIVE PROTEIN: CRP: <0.3 mg/dL (ref 0.0–0.5)

## 2024-10-07 MED ORDER — METHYLPREDNISOLONE SODIUM SUCC 40 MG IJ SOLR
40 | Freq: Two times a day (BID) | INTRAMUSCULAR | Status: DC
Start: 2024-10-07 — End: 2024-10-09
  Administered 2024-10-07 – 2024-10-09 (×4): 20 mg via INTRAVENOUS

## 2024-10-07 MED ORDER — MELATONIN 5 MG PO TBDP
5 | Freq: Every evening | ORAL | Status: DC
Start: 2024-10-07 — End: 2024-10-10
  Administered 2024-10-07 – 2024-10-10 (×4): 5 mg via ORAL

## 2024-10-07 MED ORDER — CLONAZEPAM 1 MG PO TABS
1 | Freq: Two times a day (BID) | ORAL | Status: DC
Start: 2024-10-07 — End: 2024-10-10
  Administered 2024-10-07 – 2024-10-10 (×8): 1 mg via ORAL

## 2024-10-07 MED ORDER — ACETAMINOPHEN 500 MG PO TABS
500 | Freq: Three times a day (TID) | ORAL | Status: DC
Start: 2024-10-07 — End: 2024-10-08
  Administered 2024-10-07 – 2024-10-08 (×3): 1000 mg via ORAL

## 2024-10-07 MED FILL — DILAUDID 1 MG/ML IJ SOLN: 1 mg/mL | INTRAMUSCULAR | Qty: 1

## 2024-10-07 MED FILL — POTASSIUM CHLORIDE IN NACL 20-0.9 MEQ/L-% IV SOLN: 20-0.9 MEQ/L-% | INTRAVENOUS | Qty: 1000

## 2024-10-07 MED FILL — ACETAMINOPHEN EXTRA STRENGTH 500 MG PO TABS: 500 mg | ORAL | Qty: 2

## 2024-10-07 MED FILL — METRONIDAZOLE 500 MG/100ML IV SOLN: 500 MG/100ML | INTRAVENOUS | Qty: 100

## 2024-10-07 MED FILL — METHYLPREDNISOLONE SODIUM SUCC 40 MG IJ SOLR: 40 mg | INTRAMUSCULAR | Qty: 40

## 2024-10-07 MED FILL — PANTOPRAZOLE SODIUM 40 MG IV SOLR: 40 mg | INTRAVENOUS | Qty: 40

## 2024-10-07 MED FILL — ACETAMINOPHEN 325 MG PO TABS: 325 mg | ORAL | Qty: 2

## 2024-10-07 MED FILL — CLONAZEPAM 1 MG PO TABS: 1 mg | ORAL | Qty: 1

## 2024-10-07 MED FILL — MELATONIN 5 MG PO TBDP: 5 mg | ORAL | Qty: 1

## 2024-10-07 MED FILL — ENOXAPARIN SODIUM 40 MG/0.4ML IJ SOSY: 40 MG/0.4ML | INTRAMUSCULAR | Qty: 0.4

## 2024-10-07 MED FILL — CULTURELLE PO CAPS: ORAL | Qty: 1

## 2024-10-07 MED FILL — LEVOFLOXACIN IN D5W 750 MG/150ML IV SOLN: 750 MG/150ML | INTRAVENOUS | Qty: 150

## 2024-10-07 MED FILL — NICOTINE 14 MG/24HR TD PT24: 14 mg/(24.h) | TRANSDERMAL | Qty: 1

## 2024-10-07 NOTE — Plan of Care (Signed)
"    Problem: Pain  Goal: Verbalizes/displays adequate comfort level or baseline comfort level  10/07/2024 0114 by Perri Lukes, LPN  Outcome: Progressing  10/06/2024 1542 by Doreatha Lye, RN  Outcome: Progressing     "

## 2024-10-07 NOTE — Progress Notes (Signed)
 "          Hospitalist Progress Note          Dr. Alverda Kaiser MD      Date:10/07/2024       Room:206/01  Patient Name:Yolanda Weber     Date of Birth:[birth year]     Age:53 y.o.      Chief Complaint   Patient presents with    Abdominal Pain    Vomiting         HPI as per admitting provider on 10/06/2024  Yolanda Weber is a 54 y.o. female followed by [name], MD and GI Dr. Myriam has a past medical history of Angiomyolipoma of both kidneys, Bulging lumbar disc, Diverticulosis, Fibromyalgia, Sciatica, and Sickle cell trait.  With recent gastritis and grade 2 esophagitis on EGD done on 7/1 and diverticulosis on colonoscopy.  Presents once again with similar issues with abdominal pain and associated nausea vomiting for which she describes the pain as sharp.  She said the symptoms started on Saturday morning but was very mild and was off-and-on and had increased after eating dinner continue to have pain but once again eased off and was able to sleep Sunday she attempted to rest and just did some errands decreased her p.o. intake but noted that the sharp abdominal pain became more consistent she noted diarrhea after every meal on Saturday and had fairly continuous episodes on Sunday and noted some mild blood with the diarrhea.  She also says she had vomited x 2 on Sunday.  She continued to have progression of the symptoms and came to the emergency room to get evaluated.  In the ER had elevated blood pressure 163/100 heart rate of 88 afebrile at 97.8 O2 sat 97% on room air.  BMP was within normal limits white blood count elevated 13.1 platelets elevated 465.  CT shows findings for nonspecific colitis involving the descending and rectosigmoid colon.  Patient refused Toradol dosing was given initially 4 mg of morphine but followed by a 7.7 mg IV dose of morphine Zofran 4 mg 1 L bolus and was then referred for monitoring for acute colitis/diverticulitis     Discussed management with the ED provider Dr. Karl  Smith, MD and agree with the plan for hospitalization       Subjective      10 /14/2025: seen on follow up. Resting in bed. Stated she could not rest at all as she said she had pain from mid thighs radiating down legs. Says she usually only needs tylenol  but sometimes steroids help with pain. Will start steroids and schedule tylenol  as most likely patient is having a fibromyalgia flair from her acute colitis . No nausea but still requiring dilaudid  but wishes to do full liquids. Afebrile. Wbc improved.       Review of Systems   Constitutional: Negative.    HENT: Negative.     Eyes: Negative.    Respiratory: Negative.     Cardiovascular: Negative.    Gastrointestinal:  Positive for abdominal pain. Negative for nausea.   Endocrine: Negative.    Genitourinary: Negative.    Musculoskeletal:  Positive for myalgias.   Skin: Negative.    Allergic/Immunologic: Negative.    Neurological: Negative.    Hematological: Negative.    Psychiatric/Behavioral: Negative.         Objective           Vitals Last 24 Hours:  Patient Vitals for the past 24 hrs:   Temp Pulse Resp BP  SpO2   10/07/24 1232 -- 69 -- (!) 131/91 --   10/07/24 1112 97.9 F (36.6 C) 69 18 (!) 135/105 100 %   10/07/24 1041 -- -- 16 -- --   10/07/24 1011 -- -- 18 -- --   10/07/24 0745 98.1 F (36.7 C) 63 16 136/84 100 %   10/07/24 0633 -- -- 17 -- --   10/07/24 0333 98.2 F (36.8 C) 60 -- (!) 143/78 100 %   10/07/24 0054 -- 60 18 (!) 109/99 100 %   10/06/24 2336 97.9 F (36.6 C) 55 16 132/75 99 %   10/06/24 2054 -- -- 16 -- --   10/06/24 2019 98.6 F (37 C) 62 18 110/67 100 %   10/06/24 1604 98.6 F (37 C) 66 16 (!) 150/85 98 %   10/06/24 1536 -- -- 16 -- --   10/06/24 1506 -- -- 16 -- --   10/06/24 1349 98 F (36.7 C) 60 18 (!) 150/99 100 %        I/O (24Hr):  No intake or output data in the 24 hours ending 10/07/24 1330    Physical Exam:  General: Alert, cooperative, no distress.  Head:   Normocephalic, without obvious abnormality, atraumatic.  Eyes:    Conjunctivae/corneas clear. Pupils equal, round, reactive to light. Extraocular movements intact.  Lungs:  Clear to auscultation bilaterally, no wheezes, crackles  Chest wall: No tenderness or deformity.  Heart:  Regular rate and rhythm, S1, S2 normal, no murmur, click, rub, or gallop.  Abdomen:        Soft, left upper and lower quadrant tenderness mild guarding no rebound bowel sounds normal. No masses. No organomegaly.  Back:  No spine tenderness to palpation  Extremities:Extremities normal, atraumatic, no cyanosis or edema.  Pulses: Symmetric all extremities.  Skin:    Skin color, texture, turgor normal.   Lymph nodes: Cervical nodes normal.  Neurologic: CNII-XII intact. Normal strength, sensation, and reflexes throughout.      Active Medications           Current Facility-Administered Medications   Medication Dose Route Frequency    melatonin disintegrating tablet 5 mg  5 mg Oral Nightly    methylPREDNISolone  sodium succ (SOLU-MEDROL ) 20 mg in sterile water  0.5 mL injection  20 mg IntraVENous BID    acetaminophen  (TYLENOL ) tablet 1,000 mg  1,000 mg Oral TID    pantoprazole  (PROTONIX ) 40 mg in sodium chloride  (PF) 0.9 % 10 mL injection  40 mg IntraVENous BID    sodium chloride  flush 0.9 % injection 5-40 mL  5-40 mL IntraVENous 2 times per day    sodium chloride  flush 0.9 % injection 5-40 mL  5-40 mL IntraVENous PRN    0.9 % sodium chloride  infusion   IntraVENous PRN    potassium chloride  (KLOR-CON  M) extended release tablet 40 mEq  40 mEq Oral PRN    Or    potassium bicarb-citric acid  (EFFER-K) effervescent tablet 40 mEq  40 mEq Oral PRN    Or    potassium chloride  10 mEq/100 mL IVPB (Peripheral Line)  10 mEq IntraVENous PRN    magnesium  sulfate 2000 mg in 50 mL IVPB premix  2,000 mg IntraVENous PRN    enoxaparin  (LOVENOX ) injection 40 mg  40 mg SubCUTAneous Daily    ondansetron  (ZOFRAN -ODT) disintegrating tablet 4 mg  4 mg Oral Q8H PRN    Or    ondansetron  (ZOFRAN ) injection 4 mg  4 mg IntraVENous Q6H PRN  polyethylene glycol (GLYCOLAX ) packet 17 g  17 g Oral Daily PRN    acetaminophen  (TYLENOL ) tablet 650 mg  650 mg Oral Q6H PRN    Or    acetaminophen  (TYLENOL ) suppository 650 mg  650 mg Rectal Q6H PRN    0.9% NaCl with KCl 20 mEq infusion   IntraVENous Continuous    levoFLOXacin  (LEVAQUIN ) 750 MG/150ML infusion 750 mg  750 mg IntraVENous Q24H    aluminum  & magnesium  hydroxide-simethicone  (MAALOX PLUS) 200-200-20 MG/5ML suspension 30 mL  30 mL Oral Q6H PRN    metroNIDAZOLE  (FLAGYL ) 500 mg in 0.9% NaCl 100 mL IVPB premix  500 mg IntraVENous Q8H    lactobacillus (CULTURELLE) capsule 1 capsule  1 capsule Oral Daily with breakfast    HYDROmorphone  (DILAUDID ) injection 1 mg  1 mg IntraVENous Q3H PRN    nicotine  (NICODERM CQ ) 14 MG/24HR 1 patch  1 patch TransDERmal Daily    clonazePAM  (KLONOPIN ) tablet 1 mg  1 mg Oral BID         Allergies         Shellfish allergy and Ibuprofen       Labs/Imaging/Diagnostics      Labs:  Recent Results (from the past 48 hours)   Comprehensive Metabolic Panel    Collection Time: 10/06/24  9:15 AM   Result Value Ref Range    Sodium 141 136 - 145 mmol/L    Potassium 3.5 3.5 - 5.1 mmol/L    Chloride 104 98 - 107 mmol/L    CO2 23 20 - 29 mmol/L    Anion Gap 14 2 - 14 mmol/L    Glucose 112 (H) 65 - 100 mg/dL    BUN 11 6 - 20 mg/dL    Creatinine 9.35 9.39 - 1.00 mg/dL    BUN/Creatinine Ratio 18 12 - 20      Est, Glom Filt Rate >90 >59 ml/min/1.2m2    Calcium 9.6 8.6 - 10.0 mg/dL    Total Bilirubin 0.4 0.0 - 1.2 mg/dL    AST 21 10 - 35 U/L    ALT 27 10 - 35 U/L    Alk Phosphatase 103 35 - 104 U/L    Total Protein 7.4 6.4 - 8.3 g/dL    Albumin 3.6 3.5 - 5.2 g/dL    Globulin 3.8 2.0 - 4.0 g/dL    Albumin/Globulin Ratio 1.0 (L) 1.1 - 2.2     CBC with Auto Differential    Collection Time: 10/06/24  9:15 AM   Result Value Ref Range    WBC 13.1 (H) 3.6 - 11.0 K/uL    RBC 4.78 3.80 - 5.20 M/uL    Hemoglobin 13.8 11.5 - 16.0 g/dL    Hematocrit 60.9 64.9 - 47.0 %    MCV 81.6 80.0 - 99.0 FL    MCH 28.9  26.0 - 34.0 PG    MCHC 35.4 30.0 - 36.5 g/dL    RDW 86.0 88.4 - 85.4 %    Platelets 465 (H) 150 - 400 K/uL    MPV 9.1 8.9 - 12.9 FL    Nucleated RBCs 0.0 0.0 PER 100 WBC    nRBC 0.00 0.00 - 0.01 K/uL    Neutrophils % 72.2 32.0 - 75.0 %    Lymphocytes % 22.8 12.0 - 49.0 %    Monocytes % 3.8 (L) 5.0 - 13.0 %    Eosinophils % 0.2 0.0 - 7.0 %    Basophils % 0.4 0.0 - 1.0 %  Immature Granulocytes % 0.6 (H) 0 - 0.5 %    Neutrophils Absolute 9.49 (H) 1.80 - 8.00 K/UL    Lymphocytes Absolute 2.99 0.80 - 3.50 K/UL    Monocytes Absolute 0.50 0.00 - 1.00 K/UL    Eosinophils Absolute 0.02 0.00 - 0.40 K/UL    Basophils Absolute 0.05 0.00 - 0.10 K/UL    Immature Granulocytes Absolute 0.08 (H) 0.00 - 0.04 K/UL    Differential Type AUTOMATED     Magnesium     Collection Time: 10/06/24  9:15 AM   Result Value Ref Range    Magnesium  1.8 1.6 - 2.6 mg/dL   Procalcitonin    Collection Time: 10/06/24  9:15 AM   Result Value Ref Range    Procalcitonin <0.02 ng/mL   C-Reactive Protein    Collection Time: 10/06/24  9:15 AM   Result Value Ref Range    CRP <0.3 0.0 - 0.5 mg/dL   Urinalysis with Reflex to Culture    Collection Time: 10/06/24  9:22 AM    Specimen: Urine   Result Value Ref Range    Color, UA Yellow/Straw      Appearance Clear Clear      Specific Gravity, UA 1.025 1.003 - 1.030      pH, Urine 6.0 5.0 - 8.0      Protein, UA 30 (A) Negative mg/dL    Glucose, Ur Negative Negative mg/dL    Ketones, Urine >19 (A) Negative mg/dL    Blood, Urine Large (A) Negative      Urobilinogen, Urine 0.2 0.2 - 1.0 EU/dL    Nitrite, Urine Negative Negative      Leukocyte Esterase, Urine Negative Negative      WBC, UA 0-4 0 - 5 /hpf    RBC, UA 0-5 0 - 3 /hpf    BACTERIA, URINE 2+ (A) Negative /hpf    Urine Culture if Indicated Culture not indicated by UA result Culture not indicated by UA result     Bilirubin, Confirmatory    Collection Time: 10/06/24  9:22 AM   Result Value Ref Range    Bilirubin Confirmation, UA Negative Negative     CBC with Auto  Differential    Collection Time: 10/07/24  6:35 AM   Result Value Ref Range    WBC 11.4 (H) 3.6 - 11.0 K/uL    RBC 4.32 3.80 - 5.20 M/uL    Hemoglobin 12.5 11.5 - 16.0 g/dL    Hematocrit 64.4 64.9 - 47.0 %    MCV 82.2 80.0 - 99.0 FL    MCH 28.9 26.0 - 34.0 PG    MCHC 35.2 30.0 - 36.5 g/dL    RDW 85.9 88.4 - 85.4 %    Platelets 330 150 - 400 K/uL    MPV 10.5 8.9 - 12.9 FL    Nucleated RBCs 0.0 0.0 PER 100 WBC    nRBC 0.00 0.00 - 0.01 K/uL    Neutrophils % 44.1 32.0 - 75.0 %    Lymphocytes % 46.0 12.0 - 49.0 %    Monocytes % 6.9 5.0 - 13.0 %    Eosinophils % 2.4 0.0 - 7.0 %    Basophils % 0.3 0.0 - 1.0 %    Immature Granulocytes % 0.3 0 - 0.5 %    Neutrophils Absolute 5.03 1.80 - 8.00 K/UL    Lymphocytes Absolute 5.24 (H) 0.80 - 3.50 K/UL    Monocytes Absolute 0.79 0.00 - 1.00 K/UL    Eosinophils Absolute 0.27 0.00 -  0.40 K/UL    Basophils Absolute 0.03 0.00 - 0.10 K/UL    Immature Granulocytes Absolute 0.03 0.00 - 0.04 K/UL    Differential Type Smear Scanned      RBC Comment Anisocytosis  1+       Comprehensive Metabolic Panel    Collection Time: 10/07/24  6:35 AM   Result Value Ref Range    Sodium 140 136 - 145 mmol/L    Potassium 3.8 3.5 - 5.1 mmol/L    Chloride 105 98 - 107 mmol/L    CO2 24 20 - 29 mmol/L    Anion Gap 11 2 - 14 mmol/L    Glucose 86 65 - 100 mg/dL    BUN 8 6 - 20 mg/dL    Creatinine 9.46 (L) 0.60 - 1.00 mg/dL    BUN/Creatinine Ratio 14 12 - 20      Est, Glom Filt Rate >90 >59 ml/min/1.42m2    Calcium 8.8 8.6 - 10.0 mg/dL    Total Bilirubin 0.4 0.0 - 1.2 mg/dL    AST 25 10 - 35 U/L    ALT 21 10 - 35 U/L    Alk Phosphatase 83 35 - 104 U/L    Total Protein 6.4 6.4 - 8.3 g/dL    Albumin 3.0 (L) 3.5 - 5.2 g/dL    Globulin 3.3 2.0 - 4.0 g/dL    Albumin/Globulin Ratio 0.9 (L) 1.1 - 2.2          Imaging:  CT ABDOMEN PELVIS W IV CONTRAST Additional Contrast? None  Result Date: 10/06/2024  1.  Findings of a nonspecific colitis involving the descending and rectosigmoid colon, similar to prior. 2.   Subcentimeter right retroareolar mass, possibly an intramammary lymph node, for which mammographic correlation is recommended. 3.  Numerous bilateral renal angiomyolipomas. 4.  Liver hemangioma. 5.  Pedunculated uterine fibroid. Electronically signed by Baby Stank       Assessment//Plan           Problem List:  Hospital Problems           Last Modified POA    * (Principal) Acute colitis 10/06/2024 Yes        Acute diverticulitis  -Recent colonoscopy on 10/1 showed diverticulosis and current CT shows sigmoid to rectum colitis  -Discussed with Dr. Myriam we will treat similarly as most recent admission with Levaquin  750 mg daily and Flagyl  500 mg IV every 8 hours  -Clear liquid diet  -Mild leukocytosis at 13.1- > 11.4   -Follow-up procalcitonin < 0.02   - Crp < 0.3   -Stool for enteric bacteria  -Status post morphine  high dose x 2 in the emergency room continue Dilaudid  1 mg IV every 3 hours as needed for moderate to severe pain  -Daily probiotic  -Continue current IV fluids  -GI consult for recommendations  - 10/14: continued pain but wishes to advance to full liquid. Diarrhea episodes have improved. No nausea.      Gastritis  -Recent EGD showed antral gastritis and grade 2 esophagitis  -Will continue IV Protonix  twice daily     Tobacco dependence  - Nicotine  patch 14 mg obtain daily     Fibromyalgia  - Attempt to restart home medications  - patient appears to having flair with noted thigh pain radiating down legs so will scheduled tylenol  1gm TID and also placed on steroids 20mg  IV bid and will taper      DVT prophylaxis  - Lovenox      CODE  STATUS: Full code  Designates her father Mahnoor Mathisen as medical contact        Discussion/MDM: Patient with multiple medical comorbidities, each with high likelihood for morbidity and mortality if left untreated.   I have reviewed patient's presenting subjective and objective findings, as well as all laboratory studies, imaging studies, and vital signs to date as well as  treatment rendered and patient's response to those treatments.  In addition, prior medical, surgical and relevant social and family histories were reviewed. I have discussed management plan with patient/family and with nursing staff.      Toxic drug monitoring:    This is dictation was done by dragon, advertising account planner. Quite often unanticipated grammatical, syntax, homophones and other interpretive errors or inadvertently transcribed by the computer software. Please excuse errors that have escaped final proofreading. Thank you.       Spent 50 minutes evaluting and coordinating patients care      Electronically signed by Malea Swilling R Aileene Lanum, MD on 10/07/2024 at 1:30 PM    "

## 2024-10-07 NOTE — Plan of Care (Signed)
"    Problem: Pain  Goal: Verbalizes/displays adequate comfort level or baseline comfort level  10/07/2024 1305 by Kanin Lia Lynn, RN  Outcome: Completed  10/07/2024 0114 by Perri Lukes, LPN  Outcome: Progressing     "

## 2024-10-07 NOTE — Progress Notes (Signed)
"  Lying in bed listening to music on cell phone. C/o abdominal pain.  No c/o nausea.  Up ad lib to bathroom  "

## 2024-10-07 NOTE — Progress Notes (Signed)
"  FS SN completed and documented head-to-toe assessment with [employer] RN instructor.  "

## 2024-10-07 NOTE — Progress Notes (Signed)
"  Supervisor chart review completed  "

## 2024-10-08 LAB — BASIC METABOLIC PANEL
Anion Gap: 11 mmol/L (ref 2–14)
BUN/Creatinine Ratio: 10 — ABNORMAL LOW (ref 12–20)
BUN: 5 mg/dL — ABNORMAL LOW (ref 6–20)
CO2: 24 mmol/L (ref 20–29)
Calcium: 9.2 mg/dL (ref 8.6–10.0)
Chloride: 107 mmol/L (ref 98–107)
Creatinine: 0.51 mg/dL — ABNORMAL LOW (ref 0.60–1.00)
Est, Glom Filt Rate: >90 ml/min/1.73m2 (ref >59)
Glucose: 120 mg/dL — ABNORMAL HIGH (ref 65–100)
Potassium: 3.7 mmol/L (ref 3.5–5.1)
Sodium: 142 mmol/L (ref 136–145)

## 2024-10-08 LAB — CBC WITH AUTO DIFFERENTIAL
Basophils %: 0.1 % (ref 0.0–1.0)
Basophils Absolute: 0.01 K/UL (ref 0.00–0.10)
Eosinophils %: 0 % (ref 0.0–7.0)
Eosinophils Absolute: 0 K/UL (ref 0.00–0.40)
Hematocrit: 37.6 % (ref 35.0–47.0)
Hemoglobin: 12.9 g/dL (ref 11.5–16.0)
Immature Granulocytes %: 0.6 % — ABNORMAL HIGH (ref 0–0.5)
Immature Granulocytes Absolute: 0.06 K/UL — ABNORMAL HIGH (ref 0.00–0.04)
Lymphocytes %: 22.3 % (ref 12.0–49.0)
Lymphocytes Absolute: 2.19 K/UL (ref 0.80–3.50)
MCH: 27.7 pg (ref 26.0–34.0)
MCHC: 34.3 g/dL (ref 30.0–36.5)
MCV: 80.9 FL (ref 80.0–99.0)
MPV: 11.3 FL (ref 8.9–12.9)
Monocytes %: 4.3 % — ABNORMAL LOW (ref 5.0–13.0)
Monocytes Absolute: 0.42 K/UL (ref 0.00–1.00)
Neutrophils %: 72.7 % (ref 32.0–75.0)
Neutrophils Absolute: 7.12 K/UL (ref 1.80–8.00)
Nucleated RBCs: 0 /100{WBCs}
Platelets: 319 K/uL (ref 150–400)
RBC: 4.65 M/uL (ref 3.80–5.20)
RDW: 13.7 % (ref 11.5–14.5)
WBC: 9.8 K/uL (ref 3.6–11.0)
nRBC: 0 K/uL (ref 0.00–0.01)

## 2024-10-08 LAB — LIPASE: Lipase: 9 U/L — ABNORMAL LOW (ref 13–60)

## 2024-10-08 MED FILL — DILAUDID 1 MG/ML IJ SOLN: 1 mg/mL | INTRAMUSCULAR | Qty: 1

## 2024-10-08 MED FILL — PANTOPRAZOLE SODIUM 40 MG IV SOLR: 40 mg | INTRAVENOUS | Qty: 40

## 2024-10-08 MED FILL — MELATONIN 5 MG PO TBDP: 5 mg | ORAL | Qty: 1

## 2024-10-08 MED FILL — ONDANSETRON 4 MG PO TBDP: 4 mg | ORAL | Qty: 1

## 2024-10-08 MED FILL — LEVOFLOXACIN IN D5W 750 MG/150ML IV SOLN: 750 MG/150ML | INTRAVENOUS | Qty: 150

## 2024-10-08 MED FILL — METHYLPREDNISOLONE SODIUM SUCC 40 MG IJ SOLR: 40 mg | INTRAMUSCULAR | Qty: 40

## 2024-10-08 MED FILL — METRONIDAZOLE 500 MG/100ML IV SOLN: 500 MG/100ML | INTRAVENOUS | Qty: 100

## 2024-10-08 MED FILL — ENOXAPARIN SODIUM 40 MG/0.4ML IJ SOSY: 40 MG/0.4ML | INTRAMUSCULAR | Qty: 0.4

## 2024-10-08 MED FILL — CLONAZEPAM 1 MG PO TABS: 1 mg | ORAL | Qty: 1

## 2024-10-08 MED FILL — ACETAMINOPHEN EXTRA STRENGTH 500 MG PO TABS: 500 mg | ORAL | Qty: 2

## 2024-10-08 MED FILL — POTASSIUM CHLORIDE IN NACL 20-0.9 MEQ/L-% IV SOLN: 20-0.9 MEQ/L-% | INTRAVENOUS | Qty: 1000

## 2024-10-08 MED FILL — CULTURELLE PO CAPS: ORAL | Qty: 1

## 2024-10-08 MED FILL — NICOTINE STEP 2 14 MG/24HR TD PT24: 14 mg/(24.h) | TRANSDERMAL | Qty: 1

## 2024-10-08 NOTE — Flowsheet Note (Signed)
"  Shift assessment data reviewed  "

## 2024-10-08 NOTE — Progress Notes (Signed)
"  Bedside shift change report given to Cambell Rickenbach(oncoming nurse) by montie molt (offgoing nurse). Report included the following information Nurse Handoff Report.     "

## 2024-10-08 NOTE — Plan of Care (Signed)
"    Problem: Pain  Goal: Verbalizes/displays adequate comfort level or baseline comfort level  Outcome: Progressing     "

## 2024-10-08 NOTE — Progress Notes (Signed)
 LPN assessment reviewed.

## 2024-10-08 NOTE — Progress Notes (Deleted)
"  Patient IV pump was alarming that medication was completed.    Patient resting in bed with no complaints at this time.    "

## 2024-10-08 NOTE — Plan of Care (Signed)
"    Problem: Pain  Goal: Verbalizes/displays adequate comfort level or baseline comfort level  10/08/2024 2255 by Ubaldo Maxim, RN  Outcome: Progressing  Flowsheets (Taken 10/08/2024 2255)  Verbalizes/displays adequate comfort level or baseline comfort level:   Encourage patient to monitor pain and request assistance   Assess pain using appropriate pain scale   Administer analgesics based on type and severity of pain and evaluate response   Implement non-pharmacological measures as appropriate and evaluate response   Consider cultural and social influences on pain and pain management   Notify Licensed Independent Practitioner if interventions unsuccessful or patient reports new pain  10/08/2024 1041 by Delores Gabriel BIRCH, LPN  Outcome: Progressing     Problem: Safety - Adult  Goal: Free from fall injury  Outcome: Progressing  Flowsheets (Taken 10/08/2024 2255)  Free From Fall Injury: Instruct family/caregiver on patient safety     Problem: Chronic Conditions and Co-morbidities  Goal: Patient's chronic conditions and co-morbidity symptoms are monitored and maintained or improved  Outcome: Progressing  Flowsheets (Taken 10/08/2024 2255)  Care Plan - Patient's Chronic Conditions and Co-Morbidity Symptoms are Monitored and Maintained or Improved:   Monitor and assess patient's chronic conditions and comorbid symptoms for stability, deterioration, or improvement   Collaborate with multidisciplinary team to address chronic and comorbid conditions and prevent exacerbation or deterioration   Update acute care plan with appropriate goals if chronic or comorbid symptoms are exacerbated and prevent overall improvement and discharge     "

## 2024-10-08 NOTE — Progress Notes (Signed)
"  Pt. Awake in bed on cell phone prile patent infusing call bell within reach.  "

## 2024-10-08 NOTE — Progress Notes (Signed)
 "          Hospitalist Progress Note          Dr. Alverda Kaiser MD      Date:10/08/2024       Room:206/01  Patient Name:Yolanda Weber     Date of Birth:02/25/1971     Age:53 y.o.      Chief Complaint   Patient presents with    Abdominal Pain    Vomiting         HPI as per admitting provider on 10/06/2024  Yolanda Weber is a 53 y.o. female followed by [name], MD and GI Dr. Myriam has a past medical history of Angiomyolipoma of both kidneys, Bulging lumbar disc, Diverticulosis, Fibromyalgia, Sciatica, and Sickle cell trait.  With recent gastritis and grade 2 esophagitis on EGD done on 7/1 and diverticulosis on colonoscopy.  Presents once again with similar issues with abdominal pain and associated nausea vomiting for which she describes the pain as sharp.  She said the symptoms started on Saturday morning but was very mild and was off-and-on and had increased after eating dinner continue to have pain but once again eased off and was able to sleep Sunday she attempted to rest and just did some errands decreased her p.o. intake but noted that the sharp abdominal pain became more consistent she noted diarrhea after every meal on Saturday and had fairly continuous episodes on Sunday and noted some mild blood with the diarrhea.  She also says she had vomited x 2 on Sunday.  She continued to have progression of the symptoms and came to the emergency room to get evaluated.  In the ER had elevated blood pressure 163/100 heart rate of 88 afebrile at 97.8 O2 sat 97% on room air.  BMP was within normal limits white blood count elevated 13.1 platelets elevated 465.  CT shows findings for nonspecific colitis involving the descending and rectosigmoid colon.  Patient refused Toradol dosing was given initially 4 mg of morphine but followed by a 7.7 mg IV dose of morphine Zofran 4 mg 1 L bolus and was then referred for monitoring for acute colitis/diverticulitis     Discussed management with the ED provider Dr. Karl  Smith, MD and agree with the plan for hospitalization       Subjective      10/08/2024: seen on follow up. Improved nausea and diarrhea but still having wax and wane of pain and requiring IV pain meds.     10 /14/25: seen on follow up. Resting in bed. Stated she could not rest at all as she said she had pain from mid thighs radiating down legs. Says she usually only needs tylenol  but sometimes steroids help with pain. Will start steroids and schedule tylenol  as most likely patient is having a fibromyalgia flair from her acute colitis . No nausea but still requiring dilaudid  but wishes to do full liquids. Afebrile. Wbc improved.       Review of Systems   Constitutional: Negative.    HENT: Negative.     Eyes: Negative.    Respiratory: Negative.     Cardiovascular: Negative.    Gastrointestinal:  Positive for abdominal pain. Negative for nausea.   Endocrine: Negative.    Genitourinary: Negative.    Musculoskeletal:  Positive for myalgias.   Skin: Negative.    Allergic/Immunologic: Negative.    Neurological: Negative.    Hematological: Negative.    Psychiatric/Behavioral: Negative.         Objective  Vitals Last 24 Hours:  Patient Vitals for the past 24 hrs:   Temp Pulse Resp BP SpO2   10/08/24 1426 -- -- 18 -- --   10/08/24 1356 -- -- 18 -- --   10/08/24 1122 98.4 F (36.9 C) 70 20 135/84 98 %   10/08/24 0928 -- -- 20 -- --   10/08/24 0742 97.7 F (36.5 C) 61 20 119/76 100 %   10/08/24 0452 -- -- 18 -- --   10/08/24 0355 97.3 F (36.3 C) 68 18 (!) 150/99 100 %   10/07/24 2349 97.7 F (36.5 C) 54 18 137/84 97 %   10/07/24 2213 -- -- 18 -- --   10/07/24 2012 98.2 F (36.8 C) 61 20 (!) 161/93 100 %   10/07/24 1555 98.4 F (36.9 C) 72 16 (!) 147/89 100 %   10/07/24 1514 -- -- 16 -- --        I/O (24Hr):    Intake/Output Summary (Last 24 hours) at 10/08/2024 1507  Last data filed at 10/08/2024 1138  Gross per 24 hour   Intake 3957.1 ml   Output 700 ml   Net 3257.1 ml       Physical Exam:  General: Alert,  cooperative, no distress.  Head:   Normocephalic, without obvious abnormality, atraumatic.  Eyes:   Conjunctivae/corneas clear. Pupils equal, round, reactive to light. Extraocular movements intact.  Lungs:  Clear to auscultation bilaterally, no wheezes, crackles  Chest wall: No tenderness or deformity.  Heart:  Regular rate and rhythm, S1, S2 normal, no murmur, click, rub, or gallop.  Abdomen:        Soft, left upper and lower quadrant tenderness mild guarding no rebound bowel sounds normal. No masses. No organomegaly.  Back:  No spine tenderness to palpation  Extremities:Extremities normal, atraumatic, no cyanosis or edema.  Pulses: Symmetric all extremities.  Skin:    Skin color, texture, turgor normal.   Lymph nodes: Cervical nodes normal.  Neurologic: CNII-XII intact. Normal strength, sensation, and reflexes throughout.      Active Medications           Current Facility-Administered Medications   Medication Dose Route Frequency    melatonin disintegrating tablet 5 mg  5 mg Oral Nightly    methylPREDNISolone  sodium succ (SOLU-MEDROL ) 20 mg in sterile water  0.5 mL injection  20 mg IntraVENous BID    acetaminophen  (TYLENOL ) tablet 1,000 mg  1,000 mg Oral TID    pantoprazole  (PROTONIX ) 40 mg in sodium chloride  (PF) 0.9 % 10 mL injection  40 mg IntraVENous BID    sodium chloride  flush 0.9 % injection 5-40 mL  5-40 mL IntraVENous 2 times per day    sodium chloride  flush 0.9 % injection 5-40 mL  5-40 mL IntraVENous PRN    0.9 % sodium chloride  infusion   IntraVENous PRN    potassium chloride  (KLOR-CON  M) extended release tablet 40 mEq  40 mEq Oral PRN    Or    potassium bicarb-citric acid  (EFFER-K) effervescent tablet 40 mEq  40 mEq Oral PRN    Or    potassium chloride  10 mEq/100 mL IVPB (Peripheral Line)  10 mEq IntraVENous PRN    magnesium  sulfate 2000 mg in 50 mL IVPB premix  2,000 mg IntraVENous PRN    enoxaparin  (LOVENOX ) injection 40 mg  40 mg SubCUTAneous Daily    ondansetron  (ZOFRAN -ODT) disintegrating tablet 4  mg  4 mg Oral Q8H PRN    Or    ondansetron  (ZOFRAN )  injection 4 mg  4 mg IntraVENous Q6H PRN    polyethylene glycol (GLYCOLAX ) packet 17 g  17 g Oral Daily PRN    acetaminophen  (TYLENOL ) tablet 650 mg  650 mg Oral Q6H PRN    Or    acetaminophen  (TYLENOL ) suppository 650 mg  650 mg Rectal Q6H PRN    0.9% NaCl with KCl 20 mEq infusion   IntraVENous Continuous    levoFLOXacin  (LEVAQUIN ) 750 MG/150ML infusion 750 mg  750 mg IntraVENous Q24H    aluminum  & magnesium  hydroxide-simethicone  (MAALOX PLUS) 200-200-20 MG/5ML suspension 30 mL  30 mL Oral Q6H PRN    metroNIDAZOLE  (FLAGYL ) 500 mg in 0.9% NaCl 100 mL IVPB premix  500 mg IntraVENous Q8H    lactobacillus (CULTURELLE) capsule 1 capsule  1 capsule Oral Daily with breakfast    HYDROmorphone  (DILAUDID ) injection 1 mg  1 mg IntraVENous Q3H PRN    nicotine  (NICODERM CQ ) 14 MG/24HR 1 patch  1 patch TransDERmal Daily    clonazePAM  (KLONOPIN ) tablet 1 mg  1 mg Oral BID         Allergies         Shellfish allergy and Ibuprofen       Labs/Imaging/Diagnostics      Labs:  Recent Results (from the past 48 hours)   C-Reactive Protein    Collection Time: 10/07/24  6:00 AM   Result Value Ref Range    CRP <0.3 0.0 - 0.5 mg/dL   CBC with Auto Differential    Collection Time: 10/07/24  6:35 AM   Result Value Ref Range    WBC 11.4 (H) 3.6 - 11.0 K/uL    RBC 4.32 3.80 - 5.20 M/uL    Hemoglobin 12.5 11.5 - 16.0 g/dL    Hematocrit 64.4 64.9 - 47.0 %    MCV 82.2 80.0 - 99.0 FL    MCH 28.9 26.0 - 34.0 PG    MCHC 35.2 30.0 - 36.5 g/dL    RDW 85.9 88.4 - 85.4 %    Platelets 330 150 - 400 K/uL    MPV 10.5 8.9 - 12.9 FL    Nucleated RBCs 0.0 0.0 PER 100 WBC    nRBC 0.00 0.00 - 0.01 K/uL    Neutrophils % 44.1 32.0 - 75.0 %    Lymphocytes % 46.0 12.0 - 49.0 %    Monocytes % 6.9 5.0 - 13.0 %    Eosinophils % 2.4 0.0 - 7.0 %    Basophils % 0.3 0.0 - 1.0 %    Immature Granulocytes % 0.3 0 - 0.5 %    Neutrophils Absolute 5.03 1.80 - 8.00 K/UL    Lymphocytes Absolute 5.24 (H) 0.80 - 3.50 K/UL     Monocytes Absolute 0.79 0.00 - 1.00 K/UL    Eosinophils Absolute 0.27 0.00 - 0.40 K/UL    Basophils Absolute 0.03 0.00 - 0.10 K/UL    Immature Granulocytes Absolute 0.03 0.00 - 0.04 K/UL    Differential Type Smear Scanned      RBC Comment Anisocytosis  1+       Comprehensive Metabolic Panel    Collection Time: 10/07/24  6:35 AM   Result Value Ref Range    Sodium 140 136 - 145 mmol/L    Potassium 3.8 3.5 - 5.1 mmol/L    Chloride 105 98 - 107 mmol/L    CO2 24 20 - 29 mmol/L    Anion Gap 11 2 - 14 mmol/L    Glucose 86 65 -  100 mg/dL    BUN 8 6 - 20 mg/dL    Creatinine 9.46 (L) 0.60 - 1.00 mg/dL    BUN/Creatinine Ratio 14 12 - 20      Est, Glom Filt Rate >90 >59 ml/min/1.58m2    Calcium 8.8 8.6 - 10.0 mg/dL    Total Bilirubin 0.4 0.0 - 1.2 mg/dL    AST 25 10 - 35 U/L    ALT 21 10 - 35 U/L    Alk Phosphatase 83 35 - 104 U/L    Total Protein 6.4 6.4 - 8.3 g/dL    Albumin 3.0 (L) 3.5 - 5.2 g/dL    Globulin 3.3 2.0 - 4.0 g/dL    Albumin/Globulin Ratio 0.9 (L) 1.1 - 2.2     Basic Metabolic Panel    Collection Time: 10/08/24  6:12 AM   Result Value Ref Range    Sodium 142 136 - 145 mmol/L    Potassium 3.7 3.5 - 5.1 mmol/L    Chloride 107 98 - 107 mmol/L    CO2 24 20 - 29 mmol/L    Anion Gap 11 2 - 14 mmol/L    Glucose 120 (H) 65 - 100 mg/dL    BUN 5 (L) 6 - 20 mg/dL    Creatinine 9.48 (L) 0.60 - 1.00 mg/dL    BUN/Creatinine Ratio 10 (L) 12 - 20      Est, Glom Filt Rate >90 >59 ml/min/1.44m2    Calcium 9.2 8.6 - 10.0 mg/dL   CBC with Auto Differential    Collection Time: 10/08/24  6:12 AM   Result Value Ref Range    WBC 9.8 3.6 - 11.0 K/uL    RBC 4.65 3.80 - 5.20 M/uL    Hemoglobin 12.9 11.5 - 16.0 g/dL    Hematocrit 62.3 64.9 - 47.0 %    MCV 80.9 80.0 - 99.0 FL    MCH 27.7 26.0 - 34.0 PG    MCHC 34.3 30.0 - 36.5 g/dL    RDW 86.2 88.4 - 85.4 %    Platelets 319 150 - 400 K/uL    MPV 11.3 8.9 - 12.9 FL    Nucleated RBCs 0.0 0.0 PER 100 WBC    nRBC 0.00 0.00 - 0.01 K/uL    Neutrophils % 72.7 32.0 - 75.0 %    Lymphocytes % 22.3  12.0 - 49.0 %    Monocytes % 4.3 (L) 5.0 - 13.0 %    Eosinophils % 0.0 0.0 - 7.0 %    Basophils % 0.1 0.0 - 1.0 %    Immature Granulocytes % 0.6 (H) 0 - 0.5 %    Neutrophils Absolute 7.12 1.80 - 8.00 K/UL    Lymphocytes Absolute 2.19 0.80 - 3.50 K/UL    Monocytes Absolute 0.42 0.00 - 1.00 K/UL    Eosinophils Absolute 0.00 0.00 - 0.40 K/UL    Basophils Absolute 0.01 0.00 - 0.10 K/UL    Immature Granulocytes Absolute 0.06 (H) 0.00 - 0.04 K/UL    Differential Type AUTOMATED     Lipase    Collection Time: 10/08/24  6:12 AM   Result Value Ref Range    Lipase 9 (L) 13 - 60 U/L        Imaging:  CT ABDOMEN PELVIS W IV CONTRAST Additional Contrast? None  Result Date: 10/06/2024  1.  Findings of a nonspecific colitis involving the descending and rectosigmoid colon, similar to prior. 2.  Subcentimeter right retroareolar mass, possibly an intramammary lymph node, for  which mammographic correlation is recommended. 3.  Numerous bilateral renal angiomyolipomas. 4.  Liver hemangioma. 5.  Pedunculated uterine fibroid. Electronically signed by Baby Stank       Assessment//Plan           Problem List:  Hospital Problems           Last Modified POA    * (Principal) Acute colitis 10/06/2024 Yes        Acute diverticulitis  -Recent colonoscopy on 10/1 showed diverticulosis and current CT shows sigmoid to rectum colitis  -Discussed with Dr. Myriam we will treat similarly as most recent admission with Levaquin  750 mg daily and Flagyl  500 mg IV every 8 hours  -Clear liquid diet  -Mild leukocytosis at 13.1- > 11.4   -Follow-up procalcitonin < 0.02   - Crp < 0.3   -Stool for enteric bacteria  -Status post morphine  high dose x 2 in the emergency room continue Dilaudid  1 mg IV every 3 hours as needed for moderate to severe pain  -Daily probiotic  -Continue current IV fluids  -GI consult for recommendations  - 10/14: continued pain but wishes to advance to full liquid. Diarrhea episodes have improved. No nausea.   - still requiring IV pain  meds. Keep on full liquid and advance to soft diet in AM      Gastritis  -Recent EGD showed antral gastritis and grade 2 esophagitis  -Will continue IV Protonix  twice daily     Tobacco dependence  - Nicotine  patch 14 mg obtain daily     Fibromyalgia  - Attempt to restart home medications  - patient appears to having flair with noted thigh pain radiating down legs so will scheduled tylenol  1gm TID and also placed on steroids 20mg  IV bid and will taper   - 10/15: dc tylenol       DVT prophylaxis  - Lovenox      CODE STATUS: Full code  Designates her father Malya Cirillo as medical contact        Discussion/MDM: Patient with multiple medical comorbidities, each with high likelihood for morbidity and mortality if left untreated.   I have reviewed patient's presenting subjective and objective findings, as well as all laboratory studies, imaging studies, and vital signs to date as well as treatment rendered and patient's response to those treatments.  In addition, prior medical, surgical and relevant social and family histories were reviewed. I have discussed management plan with patient/family and with nursing staff.      Toxic drug monitoring:    This is dictation was done by dragon, advertising account planner. Quite often unanticipated grammatical, syntax, homophones and other interpretive errors or inadvertently transcribed by the computer software. Please excuse errors that have escaped final proofreading. Thank you.       Spent 50 minutes evaluting and coordinating patients care      Electronically signed by Fantasia Jinkins R Jyden Kromer, MD on 10/08/2024 at 3:07 PM    "

## 2024-10-08 NOTE — Progress Notes (Signed)
"  Spiritual Health Progress Note  [facility]      Room # 206/01    Name: Yolanda Weber           Age: 53 y.o.    Gender: female          MRN: [id number]  Religion: Baptist       Preferred Language: English      Date: 10/08/24  Visit Time: Begin Time: 1000 End Time : 1100         Visit Summary: Chaplain met with this patient at this time on rounds. Patient is alone in the room resting in bed.Patient became tearful as she expressed emotions and feelings related to health, work, passing of her mother, and overall well-being. States she has her father, sister and other family to help at times like these with her son. Patient expressed she believes in God and is of [employer]. However, nothing brings more joy that her son. Chaplain provided empathic listening, prayed fervent prayer including laying on of the hands. Provided paper and pen to journal. Patient states she felt better and thanked chaplain for visit. Chaplain available upon request.      Referral/Consult From: Rounding  Encounter Overview/Reason: Initial Encounter  Encounter Code:     Crisis (if applicable):    Service Provided For: Patient     Patient was available.    Faith, Belief, Meaning:   Patient identifies as spiritual  is connected with a faith tradition or spiritual practice  has beliefs or practices that help with coping during difficult times  faith/ spirituality is a source of strength  Family/Friends No family/friends present  Rituals (if applicable)      Importance and Influence:  Patient has spiritual/personal beliefs that influence decisions regarding their health  Family/Friends No family/friends present    Community:  Patient   is connected with a spiritual community  indicated that they feel well-supported  Family/Friends   No family/ friends present.    Assessment and Plan of Care:   Emotions Expressed by Patient:   Assessment: Tearful, Complicated grieving, Compromised coping, Interrupted family processes, Impaired resilience, Stress  overload    Interventions by Chaplain:   Intervention: Active listening, Explored/Affirmed feelings, thoughts, concerns, Life review/Legacy, Sustaining Presence/Ministry of presence, Explored Coping Skills/Resources, Discussed illness injury and its impact, Prayer (assurance of)/Blessing, Guided Imagery, Healing touch, etc., Discussed belief system/religious practices/faith, Read/Provided Scripture     Result/ Response by Patient:   Outcome: Encouraged, Venting emotion, Engaged in conversation, Expressed feelings, needs, and concerns, Expressed feelings of Joy, Peace and/or Love, Receptive    Patient Plan of Care:   Plan and Referrals  Plan/Referrals: Other (Comment) (Chaplain available upon request.)     Emotions Expressed by Spouse/Family/Friends:   No family/ friends present when chaplain visited.    Chaplain Interventions with Spouse/ Family/Friends include:   active listening  explored belief system  explored meaning/ purpose  facilitated expression of thoughts and feelings  explored coping skills  affirmed coping skills/ support systems  engaged in theological/ spiritual reflection  prayer  provided ministry of presence  facilitated life review and/or legacy    Spouse/Family/Friends Plan of Care:   Spiritual care available upon referral.      Electronically signed by    "

## 2024-10-09 ENCOUNTER — Observation Stay: Admit: 2024-10-09 | Payer: Medicaid (Managed Care) | Primary: Internal Medicine

## 2024-10-09 LAB — CBC WITH AUTO DIFFERENTIAL
Basophils %: 0.2 % (ref 0.0–1.0)
Basophils Absolute: 0.03 K/UL (ref 0.00–0.10)
Eosinophils %: 0.1 % (ref 0.0–7.0)
Eosinophils Absolute: 0.01 K/UL (ref 0.00–0.40)
Hematocrit: 36.9 % (ref 35.0–47.0)
Hemoglobin: 13.2 g/dL (ref 11.5–16.0)
Immature Granulocytes %: 0.7 % — ABNORMAL HIGH (ref 0–0.5)
Immature Granulocytes Absolute: 0.11 K/UL — ABNORMAL HIGH (ref 0.00–0.04)
Lymphocytes %: 33.6 % (ref 12.0–49.0)
Lymphocytes Absolute: 5.01 K/UL — ABNORMAL HIGH (ref 0.80–3.50)
MCH: 29.1 pg (ref 26.0–34.0)
MCHC: 35.8 g/dL (ref 30.0–36.5)
MCV: 81.3 FL (ref 80.0–99.0)
MPV: 9.5 FL (ref 8.9–12.9)
Monocytes %: 6.1 % (ref 5.0–13.0)
Monocytes Absolute: 0.91 K/UL (ref 0.00–1.00)
Neutrophils %: 59.3 % (ref 32.0–75.0)
Neutrophils Absolute: 8.85 K/UL — ABNORMAL HIGH (ref 1.80–8.00)
Nucleated RBCs: 0 /100{WBCs}
Platelets: 394 K/uL (ref 150–400)
RBC: 4.54 M/uL (ref 3.80–5.20)
RDW: 13.9 % (ref 11.5–14.5)
WBC: 14.9 K/uL — ABNORMAL HIGH (ref 3.6–11.0)
nRBC: 0 K/uL (ref 0.00–0.01)

## 2024-10-09 LAB — ECHO (TTE) COMPLETE (PRN CONTRAST/BUBBLE/STRAIN/3D)
AV Area by Peak Velocity: 2.1 cm2
AV Area by VTI: 2.1 cm2
AV Mean Gradient: 4 mmHg
AV Mean Velocity: 0.9 m/s
AV Peak Gradient: 8 mmHg
AV Peak Velocity: 1.4 m/s
AV VTI: 36.8 cm
AV Velocity Ratio: 0.86
AVA/BSA Peak Velocity: 1.2 cm2/m2
AVA/BSA VTI: 1.2 cm2/m2
Ao Root Index: 1.55 cm/m2
Aortic Root: 2.8 cm
Body Surface Area: 1.84 m2
E/E' Lateral: 8.96
E/E' Ratio (Averaged): 8.69
E/E' Septal: 8.42
EF BP: 63 % (ref 55–100)
EF Physician: 60 %
Est. RA Pressure: 3 mmHg
Fractional Shortening 2D: 29 % (ref 28–44)
Global Longitudinal Strain: -18.2 %
IVSd: 0.8 cm (ref 0.6–0.9)
LA Diameter: 3.4 cm
LA Size Index: 1.88 cm/m2
LA Volume A-L A4C: 28 mL (ref 22–52)
LA Volume A-L A4C: 35 mL (ref 22–52)
LA Volume A/L: 33 mL
LA Volume BP: 32 mL (ref 22–52)
LA Volume Index A-L A2C: 15 mL/m2 (ref 16–34)
LA Volume Index A-L A4C: 19 mL/m2 (ref 16–34)
LA Volume Index A/L: 18 mL/m2 (ref 16–34)
LA Volume Index BP: 18 mL/m2 (ref 16–34)
LA Volume Index MOD A2C: 15 mL/m2 (ref 16–34)
LA Volume Index MOD A4C: 18 mL/m2 (ref 16–34)
LA Volume MOD A2C: 28 mL (ref 22–52)
LA Volume MOD A4C: 33 mL (ref 22–52)
LA/AO Root Ratio: 1.21
LV E' Lateral Velocity: 9.71 cm/s
LV E' Septal Velocity: 10.33 cm/s
LV EDV A2C: 39 mL
LV EDV A4C: 86 mL
LV EDV BP: 58 mL (ref 56–104)
LV EDV Index A2C: 22 mL/m2
LV EDV Index A4C: 48 mL/m2
LV EDV Index BP: 32 mL/m2
LV ESV A2C: 14 mL
LV ESV A4C: 29 mL
LV ESV BP: 22 mL (ref 19–49)
LV ESV Index A2C: 8 mL/m2
LV ESV Index A4C: 16 mL/m2
LV ESV Index BP: 12 mL/m2
LV Ejection Fraction A2C: 63 %
LV Ejection Fraction A4C: 66 %
LV Mass 2D Index: 78.4 g/m2 (ref 43–95)
LV Mass 2D: 141.9 g (ref 67–162)
LV RWT Ratio: 0.37
LVIDd Index: 2.71 cm/m2
LVIDd: 4.9 cm (ref 3.9–5.3)
LVIDs Index: 1.93 cm/m2
LVIDs: 3.5 cm
LVOT Area: 2.5 cm2
LVOT Diameter: 1.8 cm
LVOT Mean Gradient: 3 mmHg
LVOT Peak Gradient: 5 mmHg
LVOT Peak Velocity: 1.2 m/s
LVOT SV: 74.8 mL
LVOT Stroke Volume Index: 41.3 mL/m2
LVOT VTI: 29.4 cm
LVOT:AV VTI Index: 0.8
LVPWd: 0.9 cm (ref 0.6–0.9)
MV A Velocity: 0.6 m/s
MV E Velocity: 0.87 m/s
MV E Wave Deceleration Time: 148.4 ms
MV E/A: 1.45
RA Area 4C: 10.1 cm2
RV Free Wall Peak S': 13.2 cm/s
RVIDd: 2.8 cm
RVSP: 24 mmHg
TAPSE: 2.4 cm (ref 1.7–?)
TR Max Velocity: 2.27 m/s
TR Peak Gradient: 21 mmHg

## 2024-10-09 LAB — COMPREHENSIVE METABOLIC PANEL
ALT: 26 U/L (ref 10–35)
AST: 23 U/L (ref 10–35)
Albumin/Globulin Ratio: 1 — ABNORMAL LOW (ref 1.1–2.2)
Albumin: 3.3 g/dL — ABNORMAL LOW (ref 3.5–5.2)
Alk Phosphatase: 83 U/L (ref 35–104)
Anion Gap: 12 mmol/L (ref 2–14)
BUN/Creatinine Ratio: 8 — ABNORMAL LOW (ref 12–20)
BUN: 4 mg/dL — ABNORMAL LOW (ref 6–20)
CO2: 24 mmol/L (ref 20–29)
Calcium: 9.3 mg/dL (ref 8.6–10.0)
Chloride: 105 mmol/L (ref 98–107)
Creatinine: 0.56 mg/dL — ABNORMAL LOW (ref 0.60–1.00)
Est, Glom Filt Rate: >90 ml/min/1.73m2 (ref >59)
Globulin: 3.4 g/dL (ref 2.0–4.0)
Glucose: 103 mg/dL — ABNORMAL HIGH (ref 65–100)
Potassium: 3.6 mmol/L (ref 3.5–5.1)
Sodium: 141 mmol/L (ref 136–145)
Total Bilirubin: 0.4 mg/dL (ref 0.0–1.2)
Total Protein: 6.7 g/dL (ref 6.4–8.3)

## 2024-10-09 LAB — TROPONIN: Troponin T: <0.6 ng/L (ref 0–14)

## 2024-10-09 LAB — TSH: TSH, 3rd Generation: 0.616 u[IU]/mL (ref 0.270–4.200)

## 2024-10-09 MED ORDER — HYDROMORPHONE HCL 1 MG/ML IJ SOLN
1 | Freq: Four times a day (QID) | INTRAMUSCULAR | Status: DC | PRN
Start: 2024-10-09 — End: 2024-10-09

## 2024-10-09 MED ORDER — PANTOPRAZOLE SODIUM 40 MG PO TBEC
40 | Freq: Every day | ORAL | Status: DC
Start: 2024-10-09 — End: 2024-10-10
  Administered 2024-10-10: 12:00:00 40 mg via ORAL

## 2024-10-09 MED ORDER — METHYLPREDNISOLONE SODIUM SUCC 40 MG IJ SOLR
40 | Freq: Two times a day (BID) | INTRAMUSCULAR | Status: DC
Start: 2024-10-09 — End: 2024-10-10
  Administered 2024-10-09 – 2024-10-10 (×2): 20 mg via INTRAVENOUS

## 2024-10-09 MED ORDER — IOPAMIDOL 76 % IV SOLN
76 | Freq: Once | INTRAVENOUS | Status: AC | PRN
Start: 2024-10-09 — End: 2024-10-09
  Administered 2024-10-09: 19:00:00 100 mL via INTRAVENOUS

## 2024-10-09 MED ORDER — HYDROMORPHONE HCL 1 MG/ML IJ SOLN
1 | INTRAMUSCULAR | Status: DC | PRN
Start: 2024-10-09 — End: 2024-10-09
  Administered 2024-10-09 (×2): 0.5 mg via INTRAVENOUS

## 2024-10-09 MED ORDER — ACETAMINOPHEN 500 MG PO TABS
500 | Freq: Three times a day (TID) | ORAL | Status: DC
Start: 2024-10-09 — End: 2024-10-10
  Administered 2024-10-10 (×2): 1000 mg via ORAL

## 2024-10-09 MED ORDER — SUCRALFATE 1 G PO TABS
1 | Freq: Four times a day (QID) | ORAL | Status: DC
Start: 2024-10-09 — End: 2024-10-10
  Administered 2024-10-09 – 2024-10-10 (×5): 1 g via ORAL

## 2024-10-09 MED ORDER — TRAZODONE HCL 50 MG PO TABS
50 | Freq: Every evening | ORAL | Status: DC
Start: 2024-10-09 — End: 2024-10-10
  Administered 2024-10-10: 02:00:00 25 mg via ORAL

## 2024-10-09 MED ORDER — SODIUM CHLORIDE 0.9 % IV SOLN (ADDEASE)
0.9 | Freq: Four times a day (QID) | INTRAVENOUS | Status: DC
Start: 2024-10-09 — End: 2024-10-10
  Administered 2024-10-09 – 2024-10-10 (×4): 3375 mg via INTRAVENOUS

## 2024-10-09 MED ORDER — DIATRIZOATE MEGLUMINE & SODIUM 66-10 % PO SOLN
66-10 | Freq: Once | ORAL | Status: DC | PRN
Start: 2024-10-09 — End: 2024-10-10
  Administered 2024-10-09: 19:00:00 30 mL via ORAL

## 2024-10-09 MED ORDER — METOCLOPRAMIDE HCL 5 MG/ML IJ SOLN
5 | Freq: Once | INTRAMUSCULAR | Status: AC
Start: 2024-10-09 — End: 2024-10-09
  Administered 2024-10-09: 23:00:00 10 mg via INTRAVENOUS

## 2024-10-09 MED FILL — METHYLPREDNISOLONE SODIUM SUCC 40 MG IJ SOLR: 40 mg | INTRAMUSCULAR | Qty: 40

## 2024-10-09 MED FILL — PIPERACILLIN SOD-TAZOBACTAM SO 3.375 (3-0.375) G IV SOLR: 3.375 (3-0.375) g | INTRAVENOUS | Qty: 3375

## 2024-10-09 MED FILL — ACETAMINOPHEN EXTRA STRENGTH 500 MG PO TABS: 500 mg | ORAL | Qty: 2

## 2024-10-09 MED FILL — METRONIDAZOLE 500 MG/100ML IV SOLN: 500 MG/100ML | INTRAVENOUS | Qty: 100

## 2024-10-09 MED FILL — DILAUDID 1 MG/ML IJ SOLN: 1 mg/mL | INTRAMUSCULAR | Qty: 0.5

## 2024-10-09 MED FILL — CLONAZEPAM 1 MG PO TABS: 1 mg | ORAL | Qty: 1

## 2024-10-09 MED FILL — NICOTINE STEP 2 14 MG/24HR TD PT24: 14 mg/(24.h) | TRANSDERMAL | Qty: 1

## 2024-10-09 MED FILL — CULTURELLE PO CAPS: ORAL | Qty: 1

## 2024-10-09 MED FILL — ISOVUE-370 76 % IV SOLN: 76 % | INTRAVENOUS | Qty: 100

## 2024-10-09 MED FILL — ENOXAPARIN SODIUM 40 MG/0.4ML IJ SOSY: 40 MG/0.4ML | INTRAMUSCULAR | Qty: 0.4

## 2024-10-09 MED FILL — POTASSIUM CHLORIDE IN NACL 20-0.9 MEQ/L-% IV SOLN: 20-0.9 MEQ/L-% | INTRAVENOUS | Qty: 1000

## 2024-10-09 MED FILL — MELATONIN 5 MG PO TBDP: 5 mg | ORAL | Qty: 1

## 2024-10-09 MED FILL — PANTOPRAZOLE SODIUM 40 MG IV SOLR: 40 mg | INTRAVENOUS | Qty: 40

## 2024-10-09 MED FILL — SUCRALFATE 1 G PO TABS: 1 g | ORAL | Qty: 1

## 2024-10-09 MED FILL — GASTROGRAFIN 66-10 % PO SOLN: 66-10 % | ORAL | Qty: 30

## 2024-10-09 MED FILL — METOCLOPRAMIDE HCL 5 MG/ML IJ SOLN: 5 mg/mL | INTRAMUSCULAR | Qty: 2

## 2024-10-09 MED FILL — DILAUDID 1 MG/ML IJ SOLN: 1 mg/mL | INTRAMUSCULAR | Qty: 1

## 2024-10-09 NOTE — Plan of Care (Signed)
"    Problem: Pain  Goal: Verbalizes/displays adequate comfort level or baseline comfort level  Recent Flowsheet Documentation  Taken 10/09/2024 1408 by Jaydence Arnesen Lynn, RN  Verbalizes/displays adequate comfort level or baseline comfort level:   Encourage patient to monitor pain and request assistance   Assess pain using appropriate pain scale   Administer analgesics based on type and severity of pain and evaluate response     Problem: Pain  Goal: Verbalizes/displays adequate comfort level or baseline comfort level  Outcome: Progressing  Flowsheets (Taken 10/09/2024 1408)  Verbalizes/displays adequate comfort level or baseline comfort level:   Encourage patient to monitor pain and request assistance   Assess pain using appropriate pain scale   Administer analgesics based on type and severity of pain and evaluate response     "

## 2024-10-09 NOTE — Care Coordination (Signed)
 "TOC         RUR: OBS _high-risk for readmit   Readmission? No  1st IM letter given? No  1st [employer] letter given: No    Patient in OBS - Discussed  State OBS Notification Letter -with patient- in agreement to sign- Nursing to have patient sign-give copy to patient and copy for the chart.     Called  patient in the room to complete initial assessment     Plan   PCP   GI patient had a procedure schedule -cancel due to being in the hospital- she indicates to reschedule -she was seeing a provider in [place]- could not give me the specific provider  Transportation- Family  Pharmacy [facility] [place]   She may need a work note        10/09/24 1821   Service Assessment   Information Provided By Patient   Patient Orientation Oriented;Alert;Person   Cognition No Apparent Deficit   Primary Caregiver Self   Support Systems Other (Comment);Family Members  (did not mention any one specially.  she did speak of her father  not sure what help is provided)   Patient's Healthcare Decision Maker is: Legal Next of Kin  (indicates her Father  Dallas Bury  [phone])   PCP Verified by CM Yes   Last Visit to PCP Within last year  (verified Lonni Neu but has not been to see him in  a while wants help making a follow-up appointment.)   Prior Functional Level Independent in ADLs/IADLs   History of falls? 0   Current Functional Level at Time of Initial Assessment Independent in ADLs/IADLs   Can patient return to prior living arrangement Yes  (address  verified [address] , [place], [address]  phone [phone] indicates this is the correct)   Ability to make needs known: Good   Family able to assist with home care needs: Yes  (family but not who specially)   Receives Help From Family  (lives in the home with family)   Current Proofreader  (Medicaid transportation to [employer]- also available Insurance CM -)   Social/Functional History   Prior Level of Assist for ADLs Independent   Prior  Level of Assist for [employer] Insurance Yes   Ambulation Assistance Independent   Active Driver Yes   Mode of Engineer, Water   Occupation Full time Firefighter Prior to Acute Admission House   Lives With Family   Current Services Prior To Admission None   Potential Assistance Needed Other (Comment)  (discharge planning)   Potential Assistance Obtaining Medications No  (has [employer] - 0-min co-payment. [facility] in Address: [address] [place], [place] [address]  Phone: [phone])   Patient expects to be discharged to: Home   Home Layout One level   Home Access Level entry   Entrance Stairs - Rails None   Services At/After Discharge   [employer] Information Provided? No   Who will provide transportation at discharge? Family   Condition of Participation: Discharge Planning   The Plan for Transition of Care is related to the following treatment goals: PCP and GI follow-up   The Patient and/or Patient Representative was provided with a Choice of Provider? Patient   The Patient and/Or Patient Representative agree with the Discharge Plan? Yes   Freedom of Choice list was provided with basic dialogue that supports the patient's individualized plan  of care/goals, treatment preferences, and shares the quality data associated with the providers?  No  (no HH Or this level of care needed- provided freedom of choice for follow-up)   Patient Information   Primary Caregiver Self   Other   Would you like for me to discuss the discharge plan with any other family members/significant others, and if so, who? No   Referral To   Financial Resources Medicaid         Tryson Lumley Mangum-Holmes MSW RN   [phone]   "

## 2024-10-09 NOTE — Progress Notes (Signed)
"  Delon Amour, ANCP made aware of cardiology consult via phone.  "

## 2024-10-09 NOTE — Progress Notes (Signed)
"  EKG completed- denies chest pain and resp distress.  "

## 2024-10-09 NOTE — Consults (Signed)
 "     CARDIOLOGY CONSULTATION    REASON FOR CONSULT: bradycardia    REQUESTING PROVIDER: Mandadi,Goutham    CHIEF COMPLAINT:  abdominal pain     HISTORY OF PRESENT ILLNESS:  Yolanda Weber is A 53 year old female with a past medical history notable for angiomyolipoma of both kidneys, sickle cell trait, diverticulosis, fibromyalgia, and recent esophagitis was evaluated today for asymptomatic bradycardia observed during her hospital stay.     She reports occasional positional dizziness and left-sided chest pain. She was recently admitted to [facility] and underwent a stress test in March 2025, which was negative. However, she has not been followed by cardiology in the outpatient setting.    The bradycardia episodes have primarily occurred during sleep while receiving hydromorphone  for pain related to acute colitis. She remains hemodynamically stable without syncope or significant cardiac symptoms at this time.     Records from hospital admission course thus far reviewed.      Telemetry reviewed.  No events overnight. SB .    INPATIENT MEDICATIONS:  Home medications reviewed.    Current Facility-Administered Medications:     acetaminophen  (TYLENOL ) tablet 1,000 mg, 1,000 mg, Oral, 3 times per day, Hinedi, Kareem, MD    sucralfate  (CARAFATE ) tablet 1 g, 1 g, Oral, 4x Daily AC & HS, Mandadi, Goutham R, MD, 1 g at 10/09/24 0957    piperacillin -tazobactam (ZOSYN ) 3,375 mg in sodium chloride  0.9 % 50 mL IVPB (addEASE), 3,375 mg, IntraVENous, Q6H, Mandadi, Goutham R, MD, Last Rate: 100 mL/hr at 10/09/24 1306, 3,375 mg at 10/09/24 1306    diatrizoate  meglumine -sodium (GASTROGRAFIN ) 66-10 % solution 30 mL, 30 mL, Oral, ONCE PRN, Mandadi, Goutham R, MD, 30 mL at 10/09/24 1452    [START ON 10/10/2024] pantoprazole  (PROTONIX ) tablet 40 mg, 40 mg, Oral, QAM AC, Mandadi, Goutham R, MD    traZODone  (DESYREL ) tablet 25 mg, 25 mg, Oral, Nightly, Mandadi, Goutham R, MD    melatonin disintegrating tablet 5 mg, 5 mg, Oral, Nightly, Nardone,  Vincent, MD, 5 mg at 10/08/24 2146    sodium chloride  flush 0.9 % injection 5-40 mL, 5-40 mL, IntraVENous, 2 times per day, Mandadi, Goutham R, MD, 10 mL at 10/09/24 0959    sodium chloride  flush 0.9 % injection 5-40 mL, 5-40 mL, IntraVENous, PRN, Mandadi, Goutham R, MD, 10 mL at 10/09/24 0955    0.9 % sodium chloride  infusion, , IntraVENous, PRN, Mandadi, Goutham R, MD    potassium chloride  (KLOR-CON  M) extended release tablet 40 mEq, 40 mEq, Oral, PRN **OR** potassium bicarb-citric acid  (EFFER-K) effervescent tablet 40 mEq, 40 mEq, Oral, PRN **OR** potassium chloride  10 mEq/100 mL IVPB (Peripheral Line), 10 mEq, IntraVENous, PRN, Mandadi, Goutham R, MD    magnesium  sulfate 2000 mg in 50 mL IVPB premix, 2,000 mg, IntraVENous, PRN, Mandadi, Goutham R, MD    enoxaparin  (LOVENOX ) injection 40 mg, 40 mg, SubCUTAneous, Daily, Mandadi, Goutham R, MD, 40 mg at 10/09/24 0956    ondansetron  (ZOFRAN -ODT) disintegrating tablet 4 mg, 4 mg, Oral, Q8H PRN, 4 mg at 10/08/24 0428 **OR** ondansetron  (ZOFRAN ) injection 4 mg, 4 mg, IntraVENous, Q6H PRN, Mandadi, Goutham R, MD    polyethylene glycol (GLYCOLAX ) packet 17 g, 17 g, Oral, Daily PRN, Mandadi, Goutham R, MD    aluminum  & magnesium  hydroxide-simethicone  (MAALOX PLUS) 200-200-20 MG/5ML suspension 30 mL, 30 mL, Oral, Q6H PRN, Mandadi, Goutham R, MD    metroNIDAZOLE  (FLAGYL ) 500 mg in 0.9% NaCl 100 mL IVPB premix, 500 mg, IntraVENous, Q8H, Mandadi, Alverda SAUNDERS, MD,  Last Rate: 100 mL/hr at 10/09/24 1510, 500 mg at 10/09/24 1510    lactobacillus (CULTURELLE) capsule 1 capsule, 1 capsule, Oral, Daily with breakfast, Mandadi, Goutham R, MD, 1 capsule at 10/09/24 0957    nicotine  (NICODERM CQ ) 14 MG/24HR 1 patch, 1 patch, TransDERmal, Daily, Mandadi, Goutham R, MD, 1 patch at 10/09/24 0957    clonazePAM  (KLONOPIN ) tablet 1 mg, 1 mg, Oral, BID, Mandadi, Goutham R, MD, 1 mg at 10/09/24 0957     ALLERGIES:  Allergies reviewed with the patient,  Allergies   Allergen Reactions    Shellfish  Allergy Hives    Ibuprofen Nausea And Vomiting    .      FAMILY HISTORY:  Family history reviewed. Noncontributory       SOCIAL HISTORY:  Notable for current tobacco use, no heavy alcohol or illicit drug use.      REVIEW OF SYSTEMS:  Complete review of systems performed, pertinents noted above, all other systems are negative.    PHYSICAL EXAMINATION:    General:  Alert and oriented, NAD  Cardiovascular:  RRR, no murmur  Respiratory:  Lungs are clear  Abdomen:  soft, nontender  Extremities:  no lower extremity edema  Skin:  Dry, warm  Psych:  Normal affect      Vitals:    10/09/24 1550   BP: 138/70   Pulse: (!) 44   Resp: 20   Temp: 98.6 F (37 C)   SpO2: 100%       Recent labs results and imaging reviewed.     Discussed case with Dr. Dennise and our impression and recommendations are as follows:  Sinus bradycardia  Bradycardia likely multifactorial; probable contributors include opioid use   Echo shows normal left and right ventricular size and function with preserved EF (63%), normal diastolic function, and mild aortic valve sclerosis.  Trop <0.06  Negative stress test showing no ischemia and normal LV function (EF ~80%).  No symptoms of syncope, severe chest pain, or hemodynamic instability currently present.  Continue cardiac monitoring for bradycardia and symptom development.  Minimize opioid dosing as clinically feasible  No acute cardiac intervention indicated at this time.  Can consider placing Holter at discharge  Recommend outpatient cardiology follow-up   Colitis  Patient currently treated for acute colitis/diverticulitis causing pain and systemic inflammation.  No signs of hemodynamic instability or sepsis affecting cardiac status.  Opioid use for pain may contribute to bradycardia  Recommend GI consult  Tobacco Use   Ongoing cessation encouraged     Thank you for involving us  in the care of this patient.  Please do not hesitate to call me or Dr. Dennise if additional questions arise.    Delon JONETTA Amour,  ACNP  10/09/2024    "

## 2024-10-09 NOTE — Progress Notes (Signed)
"  Dr. Avi notified patient has been still brady but asymptomatic.  "

## 2024-10-09 NOTE — Significant Event (Addendum)
"  Called by RN .  Patient has 1mg  Dilaudid  q3h available.  She has been getting 50-62% of available doses since 10/13.  Nursing staff concerned about bradycardia and asking for alternative medications.  The patient is allergic to NSAIDS.  Has PRN tylenol  available but has not received any PRN doses since 10/13.  She had 1000mg  Tylenol  scheduled but last dose was around 9am on 10/15.    Labs reviewed - WBC improving.  Patient was sleeping comfortably earlier and does not appear to be in distress.    Offered to restart Tylenol  but patient did not want that.  Asking to be transferred to [facility].  Since there is nothing acute at this time, there is no indication for urgent transfer.  Will defer to day team.    Plan  - Continue Hydromorphone .  Patient has asymptomatic bradycardia mostly when she is sleeping.   - Tylenol  1000mg  PO q8h scheduled  - Recommend reducing dose of hydromorphone  to 0.5mg  as she is only taking it around 50-60% of the time it is available anyway.    - EKG ordered              "

## 2024-10-09 NOTE — Progress Notes (Signed)
"  1830 patient c/o diarrhea and abd pain- rated at 9 out of 10-  Dr. Avi notified  "

## 2024-10-09 NOTE — Plan of Care (Signed)
"    Problem: Pain  Goal: Verbalizes/displays adequate comfort level or baseline comfort level  10/09/2024 2351 by Myrna Greig DEL, RN  Outcome: Progressing  10/09/2024 1408 by Velvin, Vicky Lynn, RN  Outcome: Progressing  Flowsheets (Taken 10/09/2024 1408)  Verbalizes/displays adequate comfort level or baseline comfort level:   Encourage patient to monitor pain and request assistance   Assess pain using appropriate pain scale   Administer analgesics based on type and severity of pain and evaluate response     Problem: Safety - Adult  Goal: Free from fall injury  Outcome: Progressing     Problem: Chronic Conditions and Co-morbidities  Goal: Patient's chronic conditions and co-morbidity symptoms are monitored and maintained or improved  Outcome: Progressing     "

## 2024-10-09 NOTE — Progress Notes (Addendum)
 "          Hospitalist Progress Note          Dr. Alverda Kaiser MD      Date:10/09/2024       Room:206/01  Patient Name:Yolanda Weber     Date of Birth:1971-10-30     Age:53 y.o.      Chief Complaint   Patient presents with    Abdominal Pain    Vomiting         HPI as per admitting provider on 10/06/2024  Yolanda Weber is a 53 y.o. female followed by [name], MD and GI Dr. Myriam has a past medical history of Angiomyolipoma of both kidneys, Bulging lumbar disc, Diverticulosis, Fibromyalgia, Sciatica, and Sickle cell trait.  With recent gastritis and grade 2 esophagitis on EGD done on 7/1 and diverticulosis on colonoscopy.  Presents once again with similar issues with abdominal pain and associated nausea vomiting for which she describes the pain as sharp.  She said the symptoms started on Saturday morning but was very mild and was off-and-on and had increased after eating dinner continue to have pain but once again eased off and was able to sleep Sunday she attempted to rest and just did some errands decreased her p.o. intake but noted that the sharp abdominal pain became more consistent she noted diarrhea after every meal on Saturday and had fairly continuous episodes on Sunday and noted some mild blood with the diarrhea.  She also says she had vomited x 2 on Sunday.  She continued to have progression of the symptoms and came to the emergency room to get evaluated.  In the ER had elevated blood pressure 163/100 heart rate of 88 afebrile at 97.8 O2 sat 97% on room air.  BMP was within normal limits white blood count elevated 13.1 platelets elevated 465.  CT shows findings for nonspecific colitis involving the descending and rectosigmoid colon.  Patient refused Toradol dosing was given initially 4 mg of morphine but followed by a 7.7 mg IV dose of morphine Zofran 4 mg 1 L bolus and was then referred for monitoring for acute colitis/diverticulitis     Discussed management with the ED provider Dr. Karl  Smith, MD and agree with the plan for hospitalization       Subjective      10 /16/2025: seen on follow up. Improved nausea and diarrhea but still having wax and wane of pain and requiring IV pain meds.   Patient has been resting and HR has also been dropping into 40's mainly when patient is resting. She now did note that at times she would get dizzy at work but also have episodes of palpitations but never made anything of it. Had negative stress test at [facility] early this year.   Nurse notes patient did not request any IV pain medications continues to receive scheduled Tylenol   Had CT repeat done and now radiologist reading of acute diverticulitis or colitis findings continue to mention chronic changes in kidneys as well as liver but also noted visualization of the thorax showing a 4 mm right lower lobe nodule but no CT chest to compare.  May benefit further evaluation with pulmonary and CT chest.  Patient continues to be a chronic smoker    10/07/24: seen on follow up. Resting in bed. Stated she could not rest at all as she said she had pain from mid thighs radiating down legs. Says she usually only needs tylenol  but sometimes steroids help with pain.  Will start steroids and schedule tylenol  as most likely patient is having a fibromyalgia flair from her acute colitis . No nausea but still requiring dilaudid  but wishes to do full liquids. Afebrile. Wbc improved.       Review of Systems   Constitutional: Negative.    HENT: Negative.     Eyes: Negative.    Respiratory: Negative.     Cardiovascular: Negative.    Gastrointestinal:  Positive for abdominal pain. Negative for nausea.   Endocrine: Negative.    Genitourinary: Negative.    Musculoskeletal:  Positive for myalgias.   Skin: Negative.    Allergic/Immunologic: Negative.    Neurological: Negative.    Hematological: Negative.    Psychiatric/Behavioral: Negative.         Objective           Vitals Last 24 Hours:  Patient Vitals for the past 24 hrs:   Temp Pulse Resp BP  SpO2   10/09/24 1146 98.2 F (36.8 C) 59 18 (!) 146/79 99 %   10/09/24 0957 -- -- 18 -- --   10/09/24 0743 -- (!) 48 -- -- --   10/09/24 0735 98.4 F (36.9 C) 56 20 (!) 146/96 98 %   10/09/24 0620 -- -- 18 -- --   10/09/24 0424 97.9 F (36.6 C) 63 18 (!) 124/93 100 %   10/08/24 2347 98.2 F (36.8 C) 60 18 (!) 155/75 99 %   10/08/24 2255 -- -- 16 -- --   10/08/24 2225 -- -- 16 -- --   10/08/24 2102 98.6 F (37 C) 57 12 (!) 143/124 100 %   10/08/24 1538 98.6 F (37 C) 88 20 (!) 145/91 100 %   10/08/24 1426 -- -- 18 -- --   10/08/24 1356 -- -- 18 -- --        I/O (24Hr):    Intake/Output Summary (Last 24 hours) at 10/09/2024 1223  Last data filed at 10/09/2024 0057  Gross per 24 hour   Intake 2282 ml   Output --   Net 2282 ml       Physical Exam:  General: Alert, cooperative, no distress.  Head:   Normocephalic, without obvious abnormality, atraumatic.  Eyes:   Conjunctivae/corneas clear. Pupils equal, round, reactive to light. Extraocular movements intact.  Lungs:  Clear to auscultation bilaterally, no wheezes, crackles  Chest wall: No tenderness or deformity.  Heart:  Regular rate and rhythm, S1, S2 normal, no murmur, click, rub, or gallop.  Abdomen:        Soft, left upper and lower quadrant tenderness, no rebound bowel sounds normal. No masses. No organomegaly.  Back:  No spine tenderness to palpation  Extremities:Extremities normal, atraumatic, no cyanosis or edema.  Pulses: Symmetric all extremities.  Skin:    Skin color, texture, turgor normal.   Lymph nodes: Cervical nodes normal.  Neurologic: CNII-XII intact. Normal strength, sensation, and reflexes throughout.      Active Medications           Current Facility-Administered Medications   Medication Dose Route Frequency    acetaminophen  (TYLENOL ) tablet 1,000 mg  1,000 mg Oral 3 times per day    HYDROmorphone  (DILAUDID ) injection 0.5 mg  0.5 mg IntraVENous Q3H PRN    sucralfate  (CARAFATE ) tablet 1 g  1 g Oral 4x Daily AC & HS    piperacillin -tazobactam  (ZOSYN ) 3,375 mg in sodium chloride  0.9 % 50 mL IVPB (addEASE)  3,375 mg IntraVENous Q6H    melatonin disintegrating  tablet 5 mg  5 mg Oral Nightly    pantoprazole  (PROTONIX ) 40 mg in sodium chloride  (PF) 0.9 % 10 mL injection  40 mg IntraVENous BID    sodium chloride  flush 0.9 % injection 5-40 mL  5-40 mL IntraVENous 2 times per day    sodium chloride  flush 0.9 % injection 5-40 mL  5-40 mL IntraVENous PRN    0.9 % sodium chloride  infusion   IntraVENous PRN    potassium chloride  (KLOR-CON  M) extended release tablet 40 mEq  40 mEq Oral PRN    Or    potassium bicarb-citric acid  (EFFER-K) effervescent tablet 40 mEq  40 mEq Oral PRN    Or    potassium chloride  10 mEq/100 mL IVPB (Peripheral Line)  10 mEq IntraVENous PRN    magnesium  sulfate 2000 mg in 50 mL IVPB premix  2,000 mg IntraVENous PRN    enoxaparin  (LOVENOX ) injection 40 mg  40 mg SubCUTAneous Daily    ondansetron  (ZOFRAN -ODT) disintegrating tablet 4 mg  4 mg Oral Q8H PRN    Or    ondansetron  (ZOFRAN ) injection 4 mg  4 mg IntraVENous Q6H PRN    polyethylene glycol (GLYCOLAX ) packet 17 g  17 g Oral Daily PRN    aluminum  & magnesium  hydroxide-simethicone  (MAALOX PLUS) 200-200-20 MG/5ML suspension 30 mL  30 mL Oral Q6H PRN    metroNIDAZOLE  (FLAGYL ) 500 mg in 0.9% NaCl 100 mL IVPB premix  500 mg IntraVENous Q8H    lactobacillus (CULTURELLE) capsule 1 capsule  1 capsule Oral Daily with breakfast    nicotine  (NICODERM CQ ) 14 MG/24HR 1 patch  1 patch TransDERmal Daily    clonazePAM  (KLONOPIN ) tablet 1 mg  1 mg Oral BID         Allergies         Shellfish allergy and Ibuprofen       Labs/Imaging/Diagnostics      Labs:  Recent Results (from the past 48 hours)   Basic Metabolic Panel    Collection Time: 10/08/24  6:12 AM   Result Value Ref Range    Sodium 142 136 - 145 mmol/L    Potassium 3.7 3.5 - 5.1 mmol/L    Chloride 107 98 - 107 mmol/L    CO2 24 20 - 29 mmol/L    Anion Gap 11 2 - 14 mmol/L    Glucose 120 (H) 65 - 100 mg/dL    BUN 5 (L) 6 - 20 mg/dL    Creatinine  9.48 (L) 0.60 - 1.00 mg/dL    BUN/Creatinine Ratio 10 (L) 12 - 20      Est, Glom Filt Rate >90 >59 ml/min/1.33m2    Calcium 9.2 8.6 - 10.0 mg/dL   CBC with Auto Differential    Collection Time: 10/08/24  6:12 AM   Result Value Ref Range    WBC 9.8 3.6 - 11.0 K/uL    RBC 4.65 3.80 - 5.20 M/uL    Hemoglobin 12.9 11.5 - 16.0 g/dL    Hematocrit 62.3 64.9 - 47.0 %    MCV 80.9 80.0 - 99.0 FL    MCH 27.7 26.0 - 34.0 PG    MCHC 34.3 30.0 - 36.5 g/dL    RDW 86.2 88.4 - 85.4 %    Platelets 319 150 - 400 K/uL    MPV 11.3 8.9 - 12.9 FL    Nucleated RBCs 0.0 0.0 PER 100 WBC    nRBC 0.00 0.00 - 0.01 K/uL    Neutrophils % 72.7 32.0 -  75.0 %    Lymphocytes % 22.3 12.0 - 49.0 %    Monocytes % 4.3 (L) 5.0 - 13.0 %    Eosinophils % 0.0 0.0 - 7.0 %    Basophils % 0.1 0.0 - 1.0 %    Immature Granulocytes % 0.6 (H) 0 - 0.5 %    Neutrophils Absolute 7.12 1.80 - 8.00 K/UL    Lymphocytes Absolute 2.19 0.80 - 3.50 K/UL    Monocytes Absolute 0.42 0.00 - 1.00 K/UL    Eosinophils Absolute 0.00 0.00 - 0.40 K/UL    Basophils Absolute 0.01 0.00 - 0.10 K/UL    Immature Granulocytes Absolute 0.06 (H) 0.00 - 0.04 K/UL    Differential Type AUTOMATED     Lipase    Collection Time: 10/08/24  6:12 AM   Result Value Ref Range    Lipase 9 (L) 13 - 60 U/L   EKG 12 Lead    Collection Time: 10/09/24  4:11 AM   Result Value Ref Range    Ventricular Rate 47 BPM    Atrial Rate 47 BPM    P-R Interval 118 ms    QRS Duration 85 ms    Q-T Interval 480 ms    QTc Calculation (Bazett) 425 ms    P Axis 64 degrees    R Axis 49 degrees    T Axis 55 degrees    Diagnosis Sinus bradycardia  Borderline short PR interval      EKG 12 Lead    Collection Time: 10/09/24  6:32 AM   Result Value Ref Range    Ventricular Rate 43 BPM    Atrial Rate 42 BPM    P-R Interval 114 ms    QRS Duration 85 ms    Q-T Interval 493 ms    QTc Calculation (Bazett) 417 ms    P Axis 55 degrees    R Axis 30 degrees    T Axis 42 degrees    Diagnosis Sinus bradycardia  Borderline short PR interval       TSH    Collection Time: 10/09/24  8:14 AM   Result Value Ref Range    TSH, 3rd Generation 0.616 0.270 - 4.200 uIU/mL   Troponin    Collection Time: 10/09/24  8:14 AM   Result Value Ref Range    Troponin T <0.6 0 - 14 ng/L   Comprehensive Metabolic Panel    Collection Time: 10/09/24  8:14 AM   Result Value Ref Range    Sodium 141 136 - 145 mmol/L    Potassium 3.6 3.5 - 5.1 mmol/L    Chloride 105 98 - 107 mmol/L    CO2 24 20 - 29 mmol/L    Anion Gap 12 2 - 14 mmol/L    Glucose 103 (H) 65 - 100 mg/dL    BUN 4 (L) 6 - 20 mg/dL    Creatinine 9.43 (L) 0.60 - 1.00 mg/dL    BUN/Creatinine Ratio 8 (L) 12 - 20      Est, Glom Filt Rate >90 >59 ml/min/1.38m2    Calcium 9.3 8.6 - 10.0 mg/dL    Total Bilirubin 0.4 0.0 - 1.2 mg/dL    AST 23 10 - 35 U/L    ALT 26 10 - 35 U/L    Alk Phosphatase 83 35 - 104 U/L    Total Protein 6.7 6.4 - 8.3 g/dL    Albumin 3.3 (L) 3.5 - 5.2 g/dL    Globulin 3.4 2.0 - 4.0  g/dL    Albumin/Globulin Ratio 1.0 (L) 1.1 - 2.2     CBC with Auto Differential    Collection Time: 10/09/24  8:14 AM   Result Value Ref Range    WBC 14.9 (H) 3.6 - 11.0 K/uL    RBC 4.54 3.80 - 5.20 M/uL    Hemoglobin 13.2 11.5 - 16.0 g/dL    Hematocrit 63.0 64.9 - 47.0 %    MCV 81.3 80.0 - 99.0 FL    MCH 29.1 26.0 - 34.0 PG    MCHC 35.8 30.0 - 36.5 g/dL    RDW 86.0 88.4 - 85.4 %    Platelets 394 150 - 400 K/uL    MPV 9.5 8.9 - 12.9 FL    Nucleated RBCs 0.0 0.0 PER 100 WBC    nRBC 0.00 0.00 - 0.01 K/uL    Neutrophils % 59.3 32.0 - 75.0 %    Lymphocytes % 33.6 12.0 - 49.0 %    Monocytes % 6.1 5.0 - 13.0 %    Eosinophils % 0.1 0.0 - 7.0 %    Basophils % 0.2 0.0 - 1.0 %    Immature Granulocytes % 0.7 (H) 0 - 0.5 %    Neutrophils Absolute 8.85 (H) 1.80 - 8.00 K/UL    Lymphocytes Absolute 5.01 (H) 0.80 - 3.50 K/UL    Monocytes Absolute 0.91 0.00 - 1.00 K/UL    Eosinophils Absolute 0.01 0.00 - 0.40 K/UL    Basophils Absolute 0.03 0.00 - 0.10 K/UL    Immature Granulocytes Absolute 0.11 (H) 0.00 - 0.04 K/UL    Differential Type AUTOMATED      Echo (TTE) complete (PRN contrast/bubble/strain/3D)    Collection Time: 10/09/24  9:01 AM   Result Value Ref Range    IVSd 0.8 0.6 - 0.9 cm    LVIDd 4.9 3.9 - 5.3 cm    LVIDs 3.5 cm    LVOT Diameter 1.8 cm    LVPWd 0.9 0.6 - 0.9 cm    Global longitudinal strain -18.2 %    EF BP 63 55 - 100 %    LV Ejection Fraction A2C 63 %    LV Ejection Fraction A4C 66 %    LV EDV A2C 39 mL    LV EDV A4C 86 mL    LV EDV BP 58 56 - 104 mL    LV ESV A2C 14 mL    LV ESV A4C 29 mL    LV ESV BP 22 19 - 49 mL    LVOT Peak Gradient 5 mmHg    LVOT Mean Gradient 3 mmHg    LVOT SV 74.8 ml    LVOT Peak Velocity 1.2 m/s    LVOT VTI 29.4 cm    RVIDd 2.8 cm    RV Free Wall Peak S' 13.2 cm/s    LA Diameter 3.4 cm    LA Volume A/L 33 mL    LA Volume A-L A4C 28 22 - 52 mL    LA Volume A-L A4C 35 22 - 52 mL    LA Volume MOD A2C 28 22 - 52 mL    LA Volume MOD A4C 33 22 - 52 mL    LA Volume BP 32 22 - 52 mL    RA Area 4C 10.1 cm2    AV Area by Peak Velocity 2.1 cm2    AV Area by VTI 2.1 cm2    AV Peak Gradient 8 mmHg    AV Mean Gradient  4 mmHg    AV Peak Velocity 1.4 m/s    AV Mean Velocity 0.9 m/s    AV VTI 36.8 cm    MV A Velocity 0.60 m/s    MV E Wave Deceleration Time 148.4 ms    MV E Velocity 0.87 m/s    LV E' Lateral Velocity 9.71 cm/s    LV E' Septal Velocity 10.33 cm/s    TAPSE 2.4 >=1.7 cm    TR Peak Gradient 21 mmHg    TR Max Velocity 2.27 m/s    Aortic Root 2.8 cm    Body Surface Area 1.84 m2    Fractional Shortening 2D 29 28 - 44 %    LV ESV Index BP 12 mL/m2    LV EDV Index BP 32 mL/m2    LV ESV Index A4C 16 mL/m2    LV EDV Index A4C 48 mL/m2    LV ESV Index A2C 8 mL/m2    LV EDV Index A2C 22 mL/m2    LVIDd Index 2.71 cm/m2    LVIDs Index 1.93 cm/m2    LV RWT Ratio 0.37     LV Mass 2D 141.9 67 - 162 g    LV Mass 2D Index 78.4 43 - 95 g/m2    MV E/A 1.45     E/E' Ratio (Averaged) 8.69     E/E' Lateral 8.96     E/E' Septal 8.42     LA Volume Index BP 18 16 - 34 ml/m2    LA Volume Index A/L 18 16 - 34 mL/m2    LVOT Stroke Volume Index  41.3 mL/m2    LVOT Area 2.5 cm2    LA Volume Index A-L A2C 15 16 - 34 mL/m2    LA Volume Index A-L A4C 19 16 - 34 mL/m2    LA Volume Index MOD A2C 15 16 - 34 ml/m2    LA Volume Index MOD A4C 18 16 - 34 ml/m2    LA Size Index 1.88 cm/m2    LA/AO Root Ratio 1.21     Ao Root Index 1.55 cm/m2    AV Velocity Ratio 0.86     LVOT:AV VTI Index 0.80     AVA/BSA VTI 1.2 cm2/m2    AVA/BSA Peak Velocity 1.2 cm2/m2    Est. RA Pressure 3 mmHg    RVSP 24 mmHg    EF Physician 60 %        Imaging:  CT ABDOMEN PELVIS W IV CONTRAST Additional Contrast? None  Result Date: 10/06/2024  1.  Findings of a nonspecific colitis involving the descending and rectosigmoid colon, similar to prior. 2.  Subcentimeter right retroareolar mass, possibly an intramammary lymph node, for which mammographic correlation is recommended. 3.  Numerous bilateral renal angiomyolipomas. 4.  Liver hemangioma. 5.  Pedunculated uterine fibroid. Electronically signed by Baby Stank       Assessment//Plan           Problem List:  Hospital Problems           Last Modified POA    * (Principal) Acute colitis 10/06/2024 Yes        Acute diverticulitis  -Recent colonoscopy on 10/1 showed diverticulosis and current CT shows sigmoid to rectum colitis  -Discussed with Dr. Myriam we will treat similarly as most recent admission with Levaquin  750 mg daily and Flagyl  500 mg IV every 8 hours  -Clear liquid diet  -Mild leukocytosis at 13.1- > 11.4   -Follow-up procalcitonin <  0.02   - Crp < 0.3   -Stool for enteric bacteria  -Status post morphine  high dose x 2 in the emergency room continue Dilaudid  1 mg IV every 3 hours as needed for moderate to severe pain  -Daily probiotic  -Continue current IV fluids  -GI consult for recommendations  - 10/14: continued pain but wishes to advance to full liquid. Diarrhea episodes have improved. No nausea.   - still requiring IV pain meds. Keep on full liquid and advance to soft diet in AM   - 10/16: repeat CT shows no evidence of colitis so  will dc IV Dilaudid .  Discussed with Dr. Myriam and changed patient to Zosyn  and discontinue Levaquin     Asymptomatic bradycardia  - Noted overnight that patient had episodes of bradycardia into 40s noted to be asymptomatic  -EKG showing sinus bradycardia no noted block   -Cardiology consult obtained recommends Holter monitor on discharge  -After discussing with patient she does give symptoms at times when she to was working of dizziness as well as periodic palpitations  -TSH is in normal range  -2D echo shows preserved EF of 6065% left ventricular size normal normal wall thickness normal wall motion normal diastolic  Gastritis  -Recent EGD showed antral gastritis and grade 2 esophagitis  -Will continue IV Protonix  twice daily and Carafate  started     Tobacco dependence  - Nicotine  patch 14 mg obtain daily     Fibromyalgia  - Attempt to restart home medications  - patient appears to having flair with noted thigh pain radiating down legs so will scheduled tylenol  1gm TID and also placed on steroids 20mg  IV bid and will taper   - Continues on scheduled Tylenol   Was on IV steroids which improved pain     DVT prophylaxis  - Lovenox      CODE STATUS: Full code  Designates her father Valora Norell as medical contact        Discussion/MDM: Patient with multiple medical comorbidities, each with high likelihood for morbidity and mortality if left untreated.   I have reviewed patient's presenting subjective and objective findings, as well as all laboratory studies, imaging studies, and vital signs to date as well as treatment rendered and patient's response to those treatments.  In addition, prior medical, surgical and relevant social and family histories were reviewed. I have discussed management plan with patient/family and with nursing staff.      Toxic drug monitoring:    This is dictation was done by dragon, advertising account planner. Quite often unanticipated grammatical, syntax, homophones and other  interpretive errors or inadvertently transcribed by the computer software. Please excuse errors that have escaped final proofreading. Thank you.       Spent 50 minutes evaluting and coordinating patients care      Electronically signed by Delcie Ruppert R Tulio Facundo, MD on 10/09/2024 at 12:23 PM    "

## 2024-10-09 NOTE — ACP (Advance Care Planning) (Signed)
"  TOC     Patient is full code     Talked to patient   No official document at this time.   To follow-up with PCP   She indicates if she is not able to make health care decision she wants her father.       No [name],[name] Parent  Attach [phone] Yes      Legal Guardian?: No          Yolanda Weber MSW RN    [phone]   "

## 2024-10-10 ENCOUNTER — Ambulatory Visit: Payer: Medicaid (Managed Care) | Primary: Internal Medicine

## 2024-10-10 ENCOUNTER — Inpatient Hospital Stay: Admit: 2024-10-10 | Payer: Medicaid (Managed Care) | Attending: Hospitalist | Primary: Internal Medicine

## 2024-10-10 DIAGNOSIS — R001 Bradycardia, unspecified: Principal | ICD-10-CM

## 2024-10-10 LAB — EKG 12-LEAD
Atrial Rate: 47 {beats}/min
Atrial Rate: 42 {beats}/min
P Axis: 64 degrees
P Axis: 55 degrees
P-R Interval: 118 ms
P-R Interval: 114 ms
Q-T Interval: 480 ms
Q-T Interval: 493 ms
QRS Duration: 85 ms
QRS Duration: 85 ms
QTc Calculation (Bazett): 425 ms
QTc Calculation (Bazett): 417 ms
R Axis: 49 degrees
R Axis: 30 degrees
T Axis: 55 degrees
T Axis: 42 degrees
Ventricular Rate: 47 {beats}/min
Ventricular Rate: 43 {beats}/min

## 2024-10-10 LAB — BASIC METABOLIC PANEL
Anion Gap: 13 mmol/L (ref 2–14)
BUN/Creatinine Ratio: 12 (ref 12–20)
BUN: 7 mg/dL (ref 6–20)
CO2: 26 mmol/L (ref 20–29)
Calcium: 8.9 mg/dL (ref 8.6–10.0)
Chloride: 106 mmol/L (ref 98–107)
Creatinine: 0.63 mg/dL (ref 0.60–1.00)
Est, Glom Filt Rate: >90 ml/min/1.73m2 (ref >59)
Glucose: 95 mg/dL (ref 65–100)
Potassium: 3 mmol/L — ABNORMAL LOW (ref 3.5–5.1)
Sodium: 145 mmol/L (ref 136–145)

## 2024-10-10 LAB — CBC WITH AUTO DIFFERENTIAL
Basophils %: 0.2 % (ref 0.0–1.0)
Basophils Absolute: 0.03 K/UL (ref 0.00–0.10)
Eosinophils %: 0 % (ref 0.0–7.0)
Eosinophils Absolute: 0 K/UL (ref 0.00–0.40)
Hematocrit: 33.8 % — ABNORMAL LOW (ref 35.0–47.0)
Hemoglobin: 12.2 g/dL (ref 11.5–16.0)
Immature Granulocytes %: 0.4 % (ref 0–0.5)
Immature Granulocytes Absolute: 0.06 K/UL — ABNORMAL HIGH (ref 0.00–0.04)
Lymphocytes %: 29 % (ref 12.0–49.0)
Lymphocytes Absolute: 4.19 K/UL — ABNORMAL HIGH (ref 0.80–3.50)
MCH: 29 pg (ref 26.0–34.0)
MCHC: 36.1 g/dL (ref 30.0–36.5)
MCV: 80.3 FL (ref 80.0–99.0)
MPV: 9.6 FL (ref 8.9–12.9)
Monocytes %: 5.3 % (ref 5.0–13.0)
Monocytes Absolute: 0.77 K/UL (ref 0.00–1.00)
Neutrophils %: 65.1 % (ref 32.0–75.0)
Neutrophils Absolute: 9.42 K/UL — ABNORMAL HIGH (ref 1.80–8.00)
Nucleated RBCs: 0 /100{WBCs}
Platelets: 417 K/uL — ABNORMAL HIGH (ref 150–400)
RBC: 4.21 M/uL (ref 3.80–5.20)
RDW: 13.9 % (ref 11.5–14.5)
WBC: 14.5 K/uL — ABNORMAL HIGH (ref 3.6–11.0)
nRBC: 0 K/uL (ref 0.00–0.01)

## 2024-10-10 LAB — MAGNESIUM: Magnesium: 1.8 mg/dL (ref 1.6–2.6)

## 2024-10-10 MED ORDER — ALUM & MAG HYDROXIDE-SIMETH 200-200-20 MG/5ML PO SUSP
200-200-20 | Freq: Four times a day (QID) | ORAL | 0 refills | 25.00000 days | Status: DC | PRN
Start: 2024-10-10 — End: 2025-01-17

## 2024-10-10 MED ORDER — DICYCLOMINE HCL 10 MG PO CAPS
10 | Freq: Three times a day (TID) | ORAL | Status: DC
Start: 2024-10-10 — End: 2024-10-10
  Administered 2024-10-10: 16:00:00 10 mg via ORAL

## 2024-10-10 MED ORDER — METRONIDAZOLE 500 MG PO TABS
500 | ORAL_TABLET | Freq: Three times a day (TID) | ORAL | 0 refills | 7.00000 days | Status: AC
Start: 2024-10-10 — End: 2024-10-20

## 2024-10-10 MED ORDER — DICYCLOMINE HCL 10 MG PO CAPS
10 | Freq: Once | ORAL | Status: AC
Start: 2024-10-10 — End: 2024-10-10
  Administered 2024-10-10: 12:00:00 10 mg via ORAL

## 2024-10-10 MED ORDER — AMOXICILLIN-POT CLAVULANATE 875-125 MG PO TABS
875-125 | ORAL_TABLET | Freq: Two times a day (BID) | ORAL | 0 refills | 10.00000 days | Status: AC
Start: 2024-10-10 — End: 2024-10-20

## 2024-10-10 MED ORDER — METOCLOPRAMIDE HCL 10 MG PO TABS
10 | ORAL_TABLET | Freq: Four times a day (QID) | ORAL | 3 refills | 30.00000 days | Status: DC
Start: 2024-10-10 — End: 2025-01-17

## 2024-10-10 MED ORDER — METOCLOPRAMIDE HCL 5 MG PO TABS
5 | Freq: Four times a day (QID) | ORAL | Status: DC
Start: 2024-10-10 — End: 2024-10-10
  Administered 2024-10-10: 16:00:00 10 mg via ORAL

## 2024-10-10 MED ORDER — POTASSIUM CHLORIDE CRYS ER 20 MEQ PO TBCR
20 | Freq: Three times a day (TID) | ORAL | Status: DC
Start: 2024-10-10 — End: 2024-10-10
  Administered 2024-10-10: 16:00:00 20 meq via ORAL

## 2024-10-10 MED ORDER — SUCRALFATE 1 G PO TABS
1 | ORAL_TABLET | Freq: Four times a day (QID) | ORAL | 1 refills | 30.00000 days | Status: DC
Start: 2024-10-10 — End: 2025-01-17

## 2024-10-10 MED ORDER — CHOLESTYRAMINE LIGHT 4 G PO PACK
4 | PACK | Freq: Two times a day (BID) | ORAL | 3 refills | 37.50000 days | Status: DC | PRN
Start: 2024-10-10 — End: 2025-01-17

## 2024-10-10 MED ORDER — CHOLESTYRAMINE LIGHT 4 G PO PACK
4 | Freq: Two times a day (BID) | ORAL | Status: DC | PRN
Start: 2024-10-10 — End: 2024-10-10

## 2024-10-10 MED ORDER — CULTURELLE PO CAPS
ORAL_CAPSULE | Freq: Every day | ORAL | 0 refills | 30.00000 days | Status: DC
Start: 2024-10-10 — End: 2025-01-17

## 2024-10-10 MED ORDER — PANTOPRAZOLE SODIUM 40 MG PO TBEC
40 | ORAL_TABLET | Freq: Two times a day (BID) | ORAL | 3 refills | 30.00000 days | Status: DC
Start: 2024-10-10 — End: 2025-01-17

## 2024-10-10 MED ORDER — POTASSIUM CHLORIDE CRYS ER 20 MEQ PO TBCR
20 | Freq: Every day | ORAL | Status: DC
Start: 2024-10-10 — End: 2024-10-10

## 2024-10-10 MED FILL — METRONIDAZOLE 500 MG/100ML IV SOLN: 500 MG/100ML | INTRAVENOUS | Qty: 100 | Fill #0

## 2024-10-10 MED FILL — ACETAMINOPHEN EXTRA STRENGTH 500 MG PO TABS: 500 mg | ORAL | Qty: 2

## 2024-10-10 MED FILL — MELATONIN 5 MG PO TBDP: 5 mg | ORAL | Qty: 1

## 2024-10-10 MED FILL — SUCRALFATE 1 G PO TABS: 1 g | ORAL | Qty: 1 | Fill #0

## 2024-10-10 MED FILL — CLONAZEPAM 1 MG PO TABS: 1 mg | ORAL | Qty: 1

## 2024-10-10 MED FILL — NICOTINE STEP 2 14 MG/24HR TD PT24: 14 mg/(24.h) | TRANSDERMAL | Qty: 1 | Fill #0

## 2024-10-10 MED FILL — DICYCLOMINE HCL 10 MG PO CAPS: 10 mg | ORAL | Qty: 1 | Fill #0

## 2024-10-10 MED FILL — PIPERACILLIN SOD-TAZOBACTAM SO 3.375 (3-0.375) G IV SOLR: 3.375 (3-0.375) g | INTRAVENOUS | Qty: 3375 | Fill #0

## 2024-10-10 MED FILL — TRAZODONE HCL 50 MG PO TABS: 50 mg | ORAL | Qty: 1

## 2024-10-10 MED FILL — SUCRALFATE 1 G PO TABS: 1 g | ORAL | Qty: 1

## 2024-10-10 MED FILL — ACETAMINOPHEN EXTRA STRENGTH 500 MG PO TABS: 500 mg | ORAL | Qty: 2 | Fill #0

## 2024-10-10 MED FILL — METRONIDAZOLE 500 MG/100ML IV SOLN: 500 MG/100ML | INTRAVENOUS | Qty: 100

## 2024-10-10 MED FILL — KLOR-CON M20 20 MEQ PO TBCR: 20 meq | ORAL | Qty: 1 | Fill #0

## 2024-10-10 MED FILL — CLONAZEPAM 1 MG PO TABS: 1 mg | ORAL | Qty: 1 | Fill #0

## 2024-10-10 MED FILL — ENOXAPARIN SODIUM 40 MG/0.4ML IJ SOSY: 40 MG/0.4ML | INTRAMUSCULAR | Qty: 0.4 | Fill #0

## 2024-10-10 MED FILL — METOCLOPRAMIDE HCL 5 MG PO TABS: 5 mg | ORAL | Qty: 2 | Fill #0

## 2024-10-10 MED FILL — PANTOPRAZOLE SODIUM 40 MG PO TBEC: 40 mg | ORAL | Qty: 1 | Fill #0

## 2024-10-10 MED FILL — CULTURELLE PO CAPS: ORAL | Qty: 1 | Fill #0

## 2024-10-10 MED FILL — METHYLPREDNISOLONE SODIUM SUCC 40 MG IJ SOLR: 40 mg | INTRAMUSCULAR | Qty: 40 | Fill #0

## 2024-10-10 NOTE — Plan of Care (Signed)
"    Problem: Pain  Goal: Verbalizes/displays adequate comfort level or baseline comfort level  10/10/2024 1117 by Delores Brunner D, LPN  Outcome: Progressing  10/09/2024 2351 by Myrna Greig DEL, RN  Outcome: Progressing     Problem: Safety - Adult  Goal: Free from fall injury  10/10/2024 1117 by Delores Brunner BIRCH, LPN  Outcome: Progressing  10/09/2024 2351 by Myrna Greig DEL, RN  Outcome: Progressing     Problem: Chronic Conditions and Co-morbidities  Goal: Patient's chronic conditions and co-morbidity symptoms are monitored and maintained or improved  10/10/2024 1117 by Delores Brunner BIRCH, LPN  Outcome: Progressing  10/09/2024 2351 by Myrna Greig DEL, RN  Outcome: Progressing     "

## 2024-10-10 NOTE — Discharge Instructions (Addendum)
"  NOTIFY YOUR PHYSICIAN FOR ANY OF THE FOLLOWING:   Fever over 101 degrees for 24 hours.   Chest pain, shortness of breath, fever, chills, nausea, vomiting, diarrhea, change in mentation, falling, weakness, bleeding. Severe pain or pain not relieved by medications, as well as any other signs or symptoms that you may have questions about     Keep on full liquid diet and advance slowly to soft bland diet small meals.   Monitor blood pressure and HR daily and keep log also prn whenever experiencing dizziness.     Will continue two abx for 10 days  Also placed on PPI and reglan  dosing which you received dose last evening via IV and noted that it helped your abdominal pain  Currently diarrhea most likely from oral contrast drank for CT   Would recommend to hold off on restarting work for a few days      "

## 2024-10-10 NOTE — Discharge Summary (Addendum)
 "                                       Physician Discharge Summary       Patient: Yolanda Weber MRN: [id number]     Date of Birth: [birth year]  Age: 53 y.o.  Sex: female    PCP: [name], MD    Allergies: Shellfish allergy and Ibuprofen    Admit date: 10/06/2024  Admitting Provider: Alverda JONELLE Kaiser, MD    Discharge date: 10/10/2024  Discharging Provider: Alverda JONELLE Kaiser, MD    * Admission Diagnoses:   Colitis [K52.9]  Acute colitis [K52.9]  Left lower quadrant abdominal pain [R10.32]      * Discharge Diagnoses:    Active Hospital Problems    Acute colitis           Chief Complaint   Patient presents with    Abdominal Pain    Vomiting        Presentation on 10/06/2024  HPI as per admitting provider on 10/06/2024    Yolanda Weber is a 54 y.o. female followed by [name], MD and GI Dr. Myriam has a past medical history of Angiomyolipoma of both kidneys, Bulging lumbar disc, Diverticulosis, Fibromyalgia, Sciatica, and Sickle cell trait.  With recent gastritis and grade 2 esophagitis on EGD done on 7/1 and diverticulosis on colonoscopy.  Presents once again with similar issues with abdominal pain and associated nausea vomiting for which she describes the pain as sharp.  She said the symptoms started on Saturday morning but was very mild and was off-and-on and had increased after eating dinner continue to have pain but once again eased off and was able to sleep Sunday she attempted to rest and just did some errands decreased her p.o. intake but noted that the sharp abdominal pain became more consistent she noted diarrhea after every meal on Saturday and had fairly continuous episodes on Sunday and noted some mild blood with the diarrhea.  She also says she had vomited x 2 on Sunday.  She continued to have progression of the symptoms and came to the emergency room to get evaluated.  In the ER had elevated blood pressure 163/100 heart rate of 88 afebrile at 97.8 O2 sat 97% on room air.  BMP  was within normal limits white blood count elevated 13.1 platelets elevated 465.  CT shows findings for nonspecific colitis involving the descending and rectosigmoid colon.  Patient refused Toradol dosing was given initially 4 mg of morphine but followed by a 7.7 mg IV dose of morphine Zofran 4 mg 1 L bolus and was then referred for monitoring for acute colitis/diverticulitis     Discussed management with the ED provider Dr. Karl Smith, MD and agree with the plan for hospitalization     * Hospital Course:     10 /16/2025: seen on follow up. Improved nausea and diarrhea but still having wax and wane of pain and requiring IV pain meds.   Patient has been resting and HR has also been dropping into 40's mainly when patient is resting. She now did note that at times she would get dizzy at work but also have episodes of palpitations but never made anything of it. Had negative stress test at [facility] early this year.   Nurse notes patient did not request any IV pain medications continues to receive scheduled Tylenol   Had CT repeat  done and now radiologist reading of acute diverticulitis or colitis findings continue to mention chronic changes in kidneys as well as liver but also noted visualization of the thorax showing a 4 mm right lower lobe nodule but no CT chest to compare.  May benefit further evaluation with pulmonary and CT chest.  Patient continues to be a chronic smoker     10/07/24: seen on follow up. Resting in bed. Stated she could not rest at all as she said she had pain from mid thighs radiating down legs. Says she usually only needs tylenol  but sometimes steroids help with pain. Will start steroids and schedule tylenol  as most likely patient is having a fibromyalgia flair from her acute colitis . No nausea but still requiring dilaudid  but wishes to do full liquids. Afebrile. Wbc improved.     Acute diverticulitis  -Recent colonoscopy on 10/1 showed diverticulosis and current CT shows sigmoid to rectum  colitis  -Discussed with Dr. Myriam we will treat similarly as most recent admission with Levaquin  750 mg daily and Flagyl  500 mg IV every 8 hours  -Clear liquid diet  -Mild leukocytosis at 13.1- > 11.4   -Follow-up procalcitonin < 0.02   - Crp < 0.3   -Stool for enteric bacteria  -Status post morphine  high dose x 2 in the emergency room continue Dilaudid  1 mg IV every 3 hours as needed for moderate to severe pain  -Daily probiotic  -Continue current IV fluids  -GI consult for recommendations  - 10/14: continued pain but wishes to advance to full liquid. Diarrhea episodes have improved. No nausea.   - still requiring IV pain meds. Keep on full liquid and advance to soft diet in AM   - 10/16: repeat CT shows no evidence of colitis so will dc IV Dilaudid .  Discussed with Dr. Myriam and changed patient to Zosyn  and discontinue Levaquin   - noted that pain is improved especially after she got IV reglan  dose and also received IV steroid. Will send on reglan  dosing 10mg  oral QID and also will place on both abx with augmentin  875/125mg  oral bid x 10 days and flagyl  500mg  oral TID x 10 days and also placed on probiotics daily x 15 days. Also with diarrhea placed on cholestyramine  bid prn      Asymptomatic bradycardia  - Noted overnight that patient had episodes of bradycardia into 40s noted to be asymptomatic  -EKG showing sinus bradycardia no noted block   -Cardiology consult obtained recommends Holter monitor on discharge  -After discussing with patient she does give symptoms at times when she to was working of dizziness as well as periodic palpitations  -TSH is in normal range  -2D echo shows preserved EF of 6065% left ventricular size normal normal wall thickness normal wall motion normal diastolic  - MCOT device placed for 2 week monitoring and follow up with [facility] in about 3 weeks to follow results and recommendations at that time for bradycardia     Gastritis  -Recent EGD showed antral gastritis and grade 2  esophagitis  -Will continue IV Protonix  twice daily and Carafate  started  - continue on protonix  bid and carafate  QID ac meals and bedtime also reglan  10mg  oral QID     Right lower lobe 4mm pulm nodule  - incidental finding noted on CT abdomen / pelvis and would arrange follow up. Patient wishes me to make appt so will arrange follow up with Dr. Alyce Hammans      Tobacco dependence  -  Nicotine  patch 14 mg obtain daily     Fibromyalgia  - Attempt to restart home medications  - patient appears to having flair with noted thigh pain radiating down legs so will scheduled tylenol  1gm TID and also placed on steroids 20mg  IV bid and will taper   - Continues on scheduled Tylenol   Was on IV steroids which improved pain  - resolved at this time     * Procedures:   * No surgery found *        Consults:        CARDIOLOGY PROGRESS NOTE        Patient Name: Lilybelle Mayeda  Age: 53 y.o.  Gender:female  DOB:05-31-1971  MRN: [id number]     Patient seen and examined. This is a patient with a history of angiomyolipoma of both kidneys, sickle cell trait, diverticulosis, fibromyalgia, and recent esophagitis who presented with abdominal pain and nausea now being followed for sinus bradycardia. Ongoing nausea.  No chest pain, stable dyspnea.  She reports intermittent palpitations. No other complaints reported.     Telemetry reviewed, SB 40s with HR up to 100 when walking to the bathroom     Pertinent review of systems items noted above, all other systems are negative. Current medications reviewed.     Physical Examination     Allergies        Allergies   Allergen Reactions    Shellfish Allergy Hives    Ibuprofen Nausea And Vomiting             Vitals:     10/10/24 0915   BP: 130/81   Pulse: 58   Resp:     Temp:     SpO2:        Vital signs are stable  No apparent distress.  Heart has a bradycardic rate and regular rhythm.  no murmur  Lungs are clear  Abdomen is tender  Extremities have no edema  Skin is dry and warm.  Normal affect     Labs  reviewed:  Recent Results         Recent Results (from the past 12 hours)   Basic Metabolic Panel     Collection Time: 10/10/24  6:15 AM   Result Value Ref Range     Sodium 145 136 - 145 mmol/L     Potassium 3.0 (L) 3.5 - 5.1 mmol/L     Chloride 106 98 - 107 mmol/L     CO2 26 20 - 29 mmol/L     Anion Gap 13 2 - 14 mmol/L     Glucose 95 65 - 100 mg/dL     BUN 7 6 - 20 mg/dL     Creatinine 9.36 9.39 - 1.00 mg/dL     BUN/Creatinine Ratio 12 12 - 20       Est, Glom Filt Rate >90 >59 ml/min/1.109m2     Calcium 8.9 8.6 - 10.0 mg/dL   CBC with Auto Differential     Collection Time: 10/10/24  6:15 AM   Result Value Ref Range     WBC 14.5 (H) 3.6 - 11.0 K/uL     RBC 4.21 3.80 - 5.20 M/uL     Hemoglobin 12.2 11.5 - 16.0 g/dL     Hematocrit 66.1 (L) 35.0 - 47.0 %     MCV 80.3 80.0 - 99.0 FL     MCH 29.0 26.0 - 34.0 PG     MCHC 36.1 30.0 - 36.5 g/dL  RDW 13.9 11.5 - 14.5 %     Platelets 417 (H) 150 - 400 K/uL     MPV 9.6 8.9 - 12.9 FL     Nucleated RBCs 0.0 0.0 PER 100 WBC     nRBC 0.00 0.00 - 0.01 K/uL     Neutrophils % 65.1 32.0 - 75.0 %     Lymphocytes % 29.0 12.0 - 49.0 %     Monocytes % 5.3 5.0 - 13.0 %     Eosinophils % 0.0 0.0 - 7.0 %     Basophils % 0.2 0.0 - 1.0 %     Immature Granulocytes % 0.4 0 - 0.5 %     Neutrophils Absolute 9.42 (H) 1.80 - 8.00 K/UL     Lymphocytes Absolute 4.19 (H) 0.80 - 3.50 K/UL     Monocytes Absolute 0.77 0.00 - 1.00 K/UL     Eosinophils Absolute 0.00 0.00 - 0.40 K/UL     Basophils Absolute 0.03 0.00 - 0.10 K/UL     Immature Granulocytes Absolute 0.06 (H) 0.00 - 0.04 K/UL     Differential Type AUTOMATED     Magnesium      Collection Time: 10/10/24  6:15 AM   Result Value Ref Range     Magnesium  1.8 1.6 - 2.6 mg/dL            Case discussed with Dr. Dennise and our impression and recommendations are as follows:  Sinus bradycardia  Blood pressure stable, narrow QRS, no evidence of pause or heart block on tele, normal chronotropic response, no indication for PPM at this time  Echo shows normal  left and right ventricular size and function with preserved EF (63%), normal diastolic function, and mild aortic valve sclerosis.  Trop <0.06, TSH normal  She has some dyspnea and intermittent chest pain but that does not seem to correlate with bradycardia  Avoid rate limiting agents, including CCB, BB, clonidine  Recommend OP monitor, placed prior to dc to assess for arrhythmia  Previous NST normal  ED precautions given for severe fatigue/dyspnea, syncope  Follow up in office in 1-2 weeks   Colitis  Patient currently treated for acute colitis/diverticulitis causing pain and systemic inflammation, possible that bradycardia could be vagally mediated  Ongoing nausea  No signs of hemodynamic instability or sepsis affecting cardiac status.  Opioid use for pain may contribute to bradycardia  Recommend GI consult as appropriate  Tobacco Use   Ongoing cessation encouraged      Please do not hesitate to call if additional questions arise.     RICHERD JULIANNA HOLIDAY, APRN - NP  10/10/2024    Vitals Last 24 Hours:  Patient Vitals for the past 24 hrs:   Temp Pulse Resp BP SpO2   10/10/24 1200 98.6 F (37 C) (!) 44 15 117/84 98 %   10/10/24 0915 -- 58 -- 130/81 --   10/10/24 0914 -- (!) 46 -- 135/85 --   10/10/24 0912 -- (!) 47 -- 133/71 --   10/10/24 0736 98.8 F (37.1 C) (!) 47 20 104/87 100 %   10/10/24 0401 98.1 F (36.7 C) 52 18 (!) 154/77 100 %   10/10/24 0014 97.9 F (36.6 C) (!) 45 18 122/71 98 %   10/09/24 2130 98.4 F (36.9 C) (!) 48 -- 132/67 95 %   10/09/24 1550 98.6 F (37 C) (!) 44 20 138/70 100 %        Discharge Exam:  General: Alert, cooperative, no distress.  Head:   Normocephalic, without obvious abnormality, atraumatic.  Eyes:   Conjunctivae/corneas clear. Pupils equal, round, reactive to light. Extraocular movements intact.  Lungs:  Clear to auscultation bilaterally, no wheezes, crackles  Chest wall: No tenderness or deformity.  Heart:  Regular rate and rhythm, S1, S2 normal, no murmur, click, rub, or  gallop.  Abdomen:        Soft, improved minimal left lower quadrant tenderness , no rebound bowel sounds normal. No masses. No organomegaly.  Back:  No spine tenderness to palpation  Extremities:Extremities normal, atraumatic, no cyanosis or edema.  Pulses: Symmetric all extremities.  Skin:    Skin color, texture, turgor normal.   Lymph nodes: Cervical nodes normal.  Neurologic: CNII-XII intact. Normal strength, sensation, and reflexes throughout.  Labs:    Recent Results (from the past 24 hours)   Basic Metabolic Panel    Collection Time: 10/10/24  6:15 AM   Result Value Ref Range    Sodium 145 136 - 145 mmol/L    Potassium 3.0 (L) 3.5 - 5.1 mmol/L    Chloride 106 98 - 107 mmol/L    CO2 26 20 - 29 mmol/L    Anion Gap 13 2 - 14 mmol/L    Glucose 95 65 - 100 mg/dL    BUN 7 6 - 20 mg/dL    Creatinine 9.36 9.39 - 1.00 mg/dL    BUN/Creatinine Ratio 12 12 - 20      Est, Glom Filt Rate >90 >59 ml/min/1.22m2    Calcium 8.9 8.6 - 10.0 mg/dL   CBC with Auto Differential    Collection Time: 10/10/24  6:15 AM   Result Value Ref Range    WBC 14.5 (H) 3.6 - 11.0 K/uL    RBC 4.21 3.80 - 5.20 M/uL    Hemoglobin 12.2 11.5 - 16.0 g/dL    Hematocrit 66.1 (L) 35.0 - 47.0 %    MCV 80.3 80.0 - 99.0 FL    MCH 29.0 26.0 - 34.0 PG    MCHC 36.1 30.0 - 36.5 g/dL    RDW 86.0 88.4 - 85.4 %    Platelets 417 (H) 150 - 400 K/uL    MPV 9.6 8.9 - 12.9 FL    Nucleated RBCs 0.0 0.0 PER 100 WBC    nRBC 0.00 0.00 - 0.01 K/uL    Neutrophils % 65.1 32.0 - 75.0 %    Lymphocytes % 29.0 12.0 - 49.0 %    Monocytes % 5.3 5.0 - 13.0 %    Eosinophils % 0.0 0.0 - 7.0 %    Basophils % 0.2 0.0 - 1.0 %    Immature Granulocytes % 0.4 0 - 0.5 %    Neutrophils Absolute 9.42 (H) 1.80 - 8.00 K/UL    Lymphocytes Absolute 4.19 (H) 0.80 - 3.50 K/UL    Monocytes Absolute 0.77 0.00 - 1.00 K/UL    Eosinophils Absolute 0.00 0.00 - 0.40 K/UL    Basophils Absolute 0.03 0.00 - 0.10 K/UL    Immature Granulocytes Absolute 0.06 (H) 0.00 - 0.04 K/UL    Differential Type AUTOMATED      Magnesium     Collection Time: 10/10/24  6:15 AM   Result Value Ref Range    Magnesium  1.8 1.6 - 2.6 mg/dL   Cardiac event/MCOT monitor    Collection Time: 10/10/24 11:51 AM   Result Value Ref Range    Body Surface Area 1.84 m2     .result      Imaging:  CT ABDOMEN PELVIS W IV CONTRAST Additional Contrast? Oral  Result Date: 10/09/2024  1. No significant acute or inflammatory change. No diverticulitis. Refer to above findings for complete details. Electronically signed by Aletha CHRISTELLA Ham    CT ABDOMEN PELVIS W IV CONTRAST Additional Contrast? None  Result Date: 10/06/2024  1.  Findings of a nonspecific colitis involving the descending and rectosigmoid colon, similar to prior. 2.  Subcentimeter right retroareolar mass, possibly an intramammary lymph node, for which mammographic correlation is recommended. 3.  Numerous bilateral renal angiomyolipomas. 4.  Liver hemangioma. 5.  Pedunculated uterine fibroid. Electronically signed by Baby Stank               * Discharge Condition: Good  * Disposition: Home w/Family      Discharge Medications:     Medication List        START taking these medications      aluminum  & magnesium  hydroxide-simethicone  200-200-20 MG/5ML Susp suspension  Commonly known as: MAALOX PLUS  Take 30 mLs by mouth every 6 hours as needed for Indigestion     amoxicillin -clavulanate 875-125 MG per tablet  Commonly known as: AUGMENTIN   Take 1 tablet by mouth 2 times daily for 10 days     cholestyramine  light 4 g packet  Take 1 packet by mouth 2 times daily as needed (diarhea)     lactobacillus capsule  Take 1 capsule by mouth daily (with breakfast)  Start taking on: October 11, 2024     metoclopramide  10 MG tablet  Commonly known as: REGLAN   Take 1 tablet by mouth 4 times daily (before meals and nightly)     metroNIDAZOLE  500 MG tablet  Commonly known as: FLAGYL   Take 1 tablet by mouth 3 times daily for 10 days            CONTINUE taking these medications      clonazePAM  1 MG tablet  Commonly known  as: KLONOPIN      nicotine  14 MG/24HR  Commonly known as: NICODERM CQ   Place 1 patch onto the skin daily     ondansetron  4 MG disintegrating tablet  Commonly known as: ZOFRAN -ODT  Take 1 tablet by mouth 3 times daily as needed for Nausea or Vomiting     pantoprazole  40 MG tablet  Commonly known as: PROTONIX   Take 1 tablet by mouth 2 times daily (with meals)     sucralfate  1 GM tablet  Commonly known as: CARAFATE   Take 1 tablet by mouth 4 times daily (before meals and nightly)            STOP taking these medications      meloxicam  15 MG tablet  Commonly known as: MOBIC                Where to Get Your Medications        These medications were sent to [id number] - [place], [place] - [address] - P [phone] [place] [place] [phone]  [address] [place], [place] [address]      Phone: [phone]   aluminum  & magnesium  hydroxide-simethicone  200-200-20 MG/5ML Susp suspension  amoxicillin -clavulanate 875-125 MG per tablet  cholestyramine  light 4 g packet  lactobacillus capsule  metoclopramide  10 MG tablet  metroNIDAZOLE  500 MG tablet  pantoprazole  40 MG tablet  sucralfate  1 GM tablet           * Follow-up Care/Patient Instructions:  Activity: activity as tolerated  Diet: full liquid diet and advance to soft small  meals over next 3-4 days as tolerated       [name], MD  [address]  [place] [address]  [phone]    Follow up  follow up hospitalization for colitis, bradycardia    Myriam Nivia Raddle., MD  [address]  [place] [address]  [phone]    Follow up  recurrent coliitis.    pulmonologist    Follow up  4 mm right lower lobe lung nodule (202-3). Bibasilar  atelectasis.    [facility] [place]  [address] [place]  [address]  [phone]  Follow up in 3 week(s)  follow up in 3 weeks for MCOT results    Gave patient work note to return to work with no restrictions to return to work on Monday 10/13/24    This is dictation was done by  dragon, advertising account planner. Quite often unanticipated grammatical, syntax, homophones and other interpretive errors or inadvertently transcribed by the computer software. Please excuse errors that have escaped final proofreading. Thank you.       Spent 40 minutes evaluting and coordinating patient care and discharge home     Signed:  Kaamil Morefield R Janaysha Depaulo, MD  10/10/2024  12:27 PM  "

## 2024-10-10 NOTE — Progress Notes (Signed)
"@   9557 perfect served Dr.  Felecia  re: heart rate being 339 had dropped approximately 14 times since 0400.  @ 0446 Dr. Marilou replied if her blood pressure is stable and asymptomatic, then there is nothing else to do.   And also asked what is her heart rhythm when her heart rate gets that low?  0449---Replied Blood pressure @ 0401 was 154/77 and sinus brady.   @ 0450 Perfect, then nothing to do  "

## 2024-10-10 NOTE — Progress Notes (Signed)
 "    CARDIOLOGY PROGRESS NOTE      Patient Name: Yolanda Weber  Age: 53 y.o.  Gender:female  DOB:[birth year]  MRN: [id number]    Patient seen and examined. This is a patient with a history of angiomyolipoma of both kidneys, sickle cell trait, diverticulosis, fibromyalgia, and recent esophagitis who presented with abdominal pain and nausea now being followed for sinus bradycardia. Ongoing nausea.  No chest pain, stable dyspnea.  She reports intermittent palpitations. No other complaints reported.    Telemetry reviewed, SB 40s with HR up to 100 when walking to the bathroom    Pertinent review of systems items noted above, all other systems are negative. Current medications reviewed.    Physical Examination    Allergies   Allergen Reactions    Shellfish Allergy Hives    Ibuprofen Nausea And Vomiting     Vitals:    10/10/24 0915   BP: 130/81   Pulse: 58   Resp:    Temp:    SpO2:      Vital signs are stable  No apparent distress.  Heart has a bradycardic rate and regular rhythm.  no murmur  Lungs are clear  Abdomen is tender  Extremities have no edema  Skin is dry and warm.  Normal affect    Labs reviewed:  Recent Results (from the past 12 hours)   Basic Metabolic Panel    Collection Time: 10/10/24  6:15 AM   Result Value Ref Range    Sodium 145 136 - 145 mmol/L    Potassium 3.0 (L) 3.5 - 5.1 mmol/L    Chloride 106 98 - 107 mmol/L    CO2 26 20 - 29 mmol/L    Anion Gap 13 2 - 14 mmol/L    Glucose 95 65 - 100 mg/dL    BUN 7 6 - 20 mg/dL    Creatinine 9.36 9.39 - 1.00 mg/dL    BUN/Creatinine Ratio 12 12 - 20      Est, Glom Filt Rate >90 >59 ml/min/1.59m2    Calcium 8.9 8.6 - 10.0 mg/dL   CBC with Auto Differential    Collection Time: 10/10/24  6:15 AM   Result Value Ref Range    WBC 14.5 (H) 3.6 - 11.0 K/uL    RBC 4.21 3.80 - 5.20 M/uL    Hemoglobin 12.2 11.5 - 16.0 g/dL    Hematocrit 66.1 (L) 35.0 - 47.0 %    MCV 80.3 80.0 - 99.0 FL    MCH 29.0 26.0 - 34.0 PG    MCHC 36.1 30.0 - 36.5 g/dL    RDW 86.0 88.4 - 85.4 %    Platelets  417 (H) 150 - 400 K/uL    MPV 9.6 8.9 - 12.9 FL    Nucleated RBCs 0.0 0.0 PER 100 WBC    nRBC 0.00 0.00 - 0.01 K/uL    Neutrophils % 65.1 32.0 - 75.0 %    Lymphocytes % 29.0 12.0 - 49.0 %    Monocytes % 5.3 5.0 - 13.0 %    Eosinophils % 0.0 0.0 - 7.0 %    Basophils % 0.2 0.0 - 1.0 %    Immature Granulocytes % 0.4 0 - 0.5 %    Neutrophils Absolute 9.42 (H) 1.80 - 8.00 K/UL    Lymphocytes Absolute 4.19 (H) 0.80 - 3.50 K/UL    Monocytes Absolute 0.77 0.00 - 1.00 K/UL    Eosinophils Absolute 0.00 0.00 - 0.40 K/UL    Basophils Absolute  0.03 0.00 - 0.10 K/UL    Immature Granulocytes Absolute 0.06 (H) 0.00 - 0.04 K/UL    Differential Type AUTOMATED     Magnesium     Collection Time: 10/10/24  6:15 AM   Result Value Ref Range    Magnesium  1.8 1.6 - 2.6 mg/dL        Case discussed with Dr. Dennise and our impression and recommendations are as follows:  Sinus bradycardia  Blood pressure stable, narrow QRS, no evidence of pause or heart block on tele, normal chronotropic response, no indication for PPM at this time  Echo shows normal left and right ventricular size and function with preserved EF (63%), normal diastolic function, and mild aortic valve sclerosis.  Trop <0.06, TSH normal  She has some dyspnea and intermittent chest pain but that does not seem to correlate with bradycardia  Avoid rate limiting agents, including CCB, BB, clonidine  Recommend OP monitor, placed prior to dc to assess for arrhythmia  Previous NST normal  ED precautions given for severe fatigue/dyspnea, syncope  Follow up in office in 1-2 weeks   Colitis  Patient currently treated for acute colitis/diverticulitis causing pain and systemic inflammation, possible that bradycardia could be vagally mediated  Ongoing nausea  No signs of hemodynamic instability or sepsis affecting cardiac status.  Opioid use for pain may contribute to bradycardia  Recommend GI consult as appropriate  Tobacco Use   Ongoing cessation encouraged     Please do not hesitate to call  if additional questions arise.    Indi Willhite F Dacia Capers, APRN - NP  10/10/2024  "

## 2024-10-10 NOTE — Progress Notes (Signed)
 LPN assessment reviewed.

## 2024-10-10 NOTE — Progress Notes (Signed)
"  Bedside shift change report given to Cambell Rickenbach(oncoming nurse) by montie molt (offgoing nurse). Report included the following information Nurse Handoff Report.     "

## 2024-10-10 NOTE — Progress Notes (Signed)
"  Discharge instructions given pt. Verbalized understanding  pt. Escorted downstairs to private vechile for transport home.  "

## 2024-10-10 NOTE — Progress Notes (Signed)
"  10/10/2024        RE: Yolanda Weber         [address]  [place] [address]          To Whom It May Concern,      Due to medical reasons, [name]   Was patient at [facility] from 10/06/24 and discharged 10/17 and may return to work with no restrictions on Monday 10/13/24          Sincerely,          Alverda JONELLE Kaiser, MD   "

## 2024-10-10 NOTE — Plan of Care (Signed)
"    Problem: Pain  Goal: Verbalizes/displays adequate comfort level or baseline comfort level  10/10/2024 1222 by Delores Brunner D, LPN  Outcome: Completed  10/10/2024 1117 by Delores Brunner D, LPN  Outcome: Progressing  10/09/2024 2351 by Myrna Greig DEL, RN  Outcome: Progressing     Problem: Safety - Adult  Goal: Free from fall injury  10/10/2024 1222 by Delores Brunner BIRCH, LPN  Outcome: Completed  10/10/2024 1117 by Delores Brunner D, LPN  Outcome: Progressing  10/09/2024 2351 by Myrna Greig DEL, RN  Outcome: Progressing     Problem: Chronic Conditions and Co-morbidities  Goal: Patient's chronic conditions and co-morbidity symptoms are monitored and maintained or improved  10/10/2024 1222 by Delores Brunner D, LPN  Outcome: Completed  10/10/2024 1117 by Delores Brunner D, LPN  Outcome: Progressing  10/09/2024 2351 by Myrna Greig DEL, RN  Outcome: Progressing     "

## 2024-10-21 ENCOUNTER — Encounter

## 2024-10-21 ENCOUNTER — Inpatient Hospital Stay: Admit: 2024-10-21 | Payer: Medicaid (Managed Care) | Primary: Internal Medicine

## 2024-10-21 DIAGNOSIS — Z1231 Encounter for screening mammogram for malignant neoplasm of breast: Principal | ICD-10-CM

## 2024-10-30 LAB — CARDIAC EVENT/MCOT MONITOR: Body Surface Area: 1.84 m2

## 2024-11-09 NOTE — Discharge Summary (Signed)
 "Inpatient Discharge Summary    BRIEF OVERVIEW  Admitting Provider: Josem Outhouse, MD  Discharge Provider: Josem Outhouse, MD   Primary Care Physician at Discharge: Lonni Finical, MD [phone]     Admission Date: 11/06/2024     Discharge Date: 11/09/2024      Primary Discharge Diagnosis:  Mild colitis, descending and sigmoid colon  Recent admission/treatment for colitis  Extensive diverticulosis   Leukocytosis   Hypokalemia, Hypomagnesemia   Left Renal Lesions two heterogeneous enhancing lesions in the left kidney superior pole   Renal angiomyolipomas  Liver hemangiomas  Multiple uterine fibroids, measuring up to 4 cm  Outpatient Follow-Up:  Future Appointments   Date Time Provider Department Center   11/11/2024  9:00 AM [place], [place] [facility]   11/13/2024  9:00 AM Guerry Perl, [place] [facility]         Test Results Pending at Discharge:          DETAILS OF HOSPITAL STAY    Reason for Hospitalization:  Colitis [K52.9]      Hospital Course:  Problem List:  Principal Problem:    Colitis  Active Problems:    Anxiety and depression    Hypertension    Nicotine  dependence with current use    Hypomagnesemia    Lesion of left native kidney    Diverticulosis of colon    Obesity (BMI 30.0-34.9)   Hanley Rispoli is a 53 y.o. female with h/o HTN, Lesion of the left native kidney, extensive diverticulosis of colon, recent colitis and diverticulitis, anxiety and depression, fibromyalgia, current everyday smoker, obesity who presents to ER with complaints of left lower quadrant pain x 2 days. Patient reports pain to be sharp, waxing and waning, nonradiating and associated with nausea and vomiting. In the ER, magnesium  is 1.7 mg/dL, WBCs 84.3. CT A/P: Extensive diverticulosis. Mild long segment wall thickening throughout the descending and sigmoid colon. No surrounding fat stranding. May be secondary to scarring from previous diverticulitis versus mild colitis. There are 2 heterogeneous enhancing lesions in the  left kidney superior pole. After admission, patient started on empiric antibiotics with zosyn  and supportive care with ivf and antiemetics. C.diff and GI pathogen negative. Hypokalemia and hypomagnesemia corrected. Patient initial symptoms gradually improved and she tolerated diet. Discussed with the patient regarding getting inpatient MRI on Monday vs outpatient, given the CT finding which showed two heterogeneous enhancing lesions in the left kidney superior pole The posterior margin is aa 2.3 x 2.3.3.2 cm lesion and at the superior margin is a 1.7x1.9x1 cm lesion - indeterminate could reflect RCC, patient elected outpatient abdominal MRI, Patient advised to f/up with her PCP for Abd MRI as soon as possible and GI clinic for her recurrent colitis. On the day of discharge, patient was alert, oriented and hemodynamically stable. She is now medically stable and can be discharged home with advise to f/up with her PCP and GI clinics as outpatient.           Procedures Performed:       Labs:  BMP:  Results from last 7 days   Lab Units 11/08/24  0253 11/07/24  0358 11/06/24  2240   SODIUM mmol/L 142 139 137   POTASSIUM mmol/L 4.1 3.0* 3.6   CHLORIDE mmol/L 112* 107 106   CO2 mmol/L 25 24 25    BUN mg/dL 8 10 12    CREATININE mg/dL 9.42 9.41 9.40   CALCIUM mg/dL 9.0 9.1 9.4   MAGNESIUM  mg/dL 1.8 2.5 1.7*  PHOSPHORUS mg/dL 3.7 4.1  --       LFTS:  Results from last 7 days   Lab Units 11/06/24  2240   AST U/L 28   ALT U/L 12   PROTEIN, TOTAL g/dL 7.3   ALBUMIN g/dL 4.0   BILIRUBIN TOTAL mg/dL 0.4   ALK PHOS U/L 82   LIPASE U/L 14      Glucose:  Results from last 7 days   Lab Units 11/08/24  0253 11/07/24  0358 11/06/24  2240   GLUCOSE mg/dL 895* 94 895*      CBC:  Results from last 7 days   Lab Units 11/08/24  0253 11/07/24  0358 11/06/24  2240   WBC AUTO 10e9/L 10.5 16.3* 15.6*   HEMOGLOBIN g/dL 87.7 86.8 86.7   HEMATOCRIT % 35.7 38.2 38.0   PLATELETS AUTO 10*3/uL 440 402 487*   MCV fL 83.6 82.2 81.7   RDW % 14.7 14.5 14.4       Lactic Acid:     INR:     pro-BNP:       Cardiac Injury Profile:      No lab exists for component: TROP    Labs in Last 3 months:  Lab Results   Component Value Date    CHOL 205 (H) 03/08/2024    TRIG 142 03/08/2024    HDL 40 03/08/2024    TSH 0.84 03/08/2024       Diagnostic Results:  CT abdomen pelvis w IV contrast  Narrative: INDICATION:  Left lower quadrant abdominal pain.  History of diverticulitis.    TECHNIQUE:  CT of the abdomen and pelvis was performed with intravenous contrast.   Omnipaque 350 100 mL was administered intravenously.   788 DICOM images received.    Automated mA/kV exposure control was utilized and patient examination was performed  in strict accordance with principles of ALARA.    RADIATION AMOUNT:  403 mGy-cm.    COMPARISON:  CT AP 08/15/2024.    FINDINGS:   Abdomen:  The lung bases are clear.  The liver is no acute finding.  Approximately 3 cm  hemangioma noted in the superior portion of segment 4.  Absent gallbladder.  No  biliary dilatation.    The pancreas, spleen, adrenal glands, and kidneys demonstrate no acute pathology.    There are multiple low-density lesions in both kidneys.  There is mostly measure  fat density.  The largest on the left is 2 cm and largest in the right is 2.1 cm.  There are also 2 heterogeneous enhancing lesions in the left kidney superior pole.   The posterior margin is a 2.3 x 2.3 x 3.2 cm lesion and at the superior margin is a  1.7 x 1.9 x 1 cm lesion.    There is no free air or lymph node enlargement.  Abdominal aorta is not aneurysmal.    Pelvis:  Extensive diverticulosis.  Mild long segment wall thickening throughout the  descending and sigmoid colon.  No surrounding fat stranding.  No bowel obstruction.   No signs of acute appendicitis..  There is no free fluid.  Lymph nodes are not  enlarged.  Urinary bladder is unremarkable.  Uterus contains multiple masses  measuring up to 4 cm.  Ovaries are not well seen.    Skeleton:    There are no acute  fractures.  No suspicious bony lesions.  Impression: 1.Extensive diverticulosis. Mild long segment wall thickening throughout the  descending and sigmoid colon. No  surrounding fat stranding. Findings may be  secondary to scarring from prior diverticulitis versus mild colitis.  2.There are 2 heterogeneous enhancing lesions in the left kidney superior  pole. The posterior margin is a 2.3 x 2.3 x 3.2 cm lesion and at the superior  margin is a 1.7 x 1.9 x 1 cm lesion. These are indeterminate and could reflect  renal cell carcinoma.  Recommend fully characterizing these with routine outpatient  MRI abdomen with and without contrast or multiphase contrast-enhanced CT of the  abdomen (renal mass protocol) if not previously performed.  3.Additional multiple low-density lesions in both kidneys. These are mostly  measure fat density and are most likely benign angiomyolipomas.  4.Multiple uterine fibroids measuring up to 4 cm.  5.Approximately 3 cm hemangioma in the superior portion of segment 4 of the  liver.    Electronically Signed by Curtistine Costa, MD 11/07/2024 12:51 AM       ECHO:  Transthoracic echo (TTE) complete 03/08/2024    Narrative  +------------+  +--------------------------+                                                                                                                                          :            :  :                          :                                              [facility]                                                                 :            :  :                          :                                             [address]                                                                :            :  :                          :  [place], [place] [address]                                                                    :            :  :                          :                                                   Phone: [phone]                                                                     :            :  +--------------------------+                                                                                                                                          +------------+  Transthoracic Echocardiography Report  +-----------------------------------------------------------------------------------------------------------------------------------------------------------------------------------+  :Name: SHEREEN MEMS                         Study Date: 03/08/2024 08:26 AM                                                                                                        :  :Attending Physician: GRACIELA, IFEANYICHUKWUAccession#: [id number]                                                                                                                 :  :  MRN: [id number]                                Patient Location: C31^[id number] [id number]  :  :DOB: 02/03/1971                             Gender: Female                                                                                                                         :  :Age: 27 yrs                                 BP: 118/83 mmHg                                                                                                                        :  :Height: 62 in                               Weight: 202 lb                                                                                                                         :  :                                                                                                                                                                                   :  :  BSA: 1.9 m2                                                                                                                                                                        :  :Heart Rate: 61                                                                                                                                                                      :  :Reason For Study: ACS                                                                                                                                                              :  :History: Smoker                                                                                                                                                                    :  +-----------------------------------------------------------------------------------------------------------------------------------------------------------------------------------+  PROCEDURE  Procedure(CPT Code): TTE Complete (06693-73) 2D with Doppler and Color Flow: No add on codes required).    Interpretation Summary  Technically difficult study.  1. Normal LV size and systolic function. LVEF 55-60%. There is no obvious regional wall motion abnormality.  2. RV is not well seen, grossly appears normal in size and function.  3. No obvious significant valvular disease.  4. Normal right atrial pressure.    LEFT VENTRICLE  The left ventricular size is normal. There is normal left ventricular wall thickness. Left ventricular systolic function is normal. LV ejection fraction = 55-60%. Normal left  ventricular diastolic function. No regional wall motion abnormalities noted.    RIGHT VENTRICLE  The right ventricle is not well visualized. The right ventricle is normal in size and function.    LEFT ATRIUM  The left atrial size is normal.    RIGHT ATRIUM  Right atrial size is normal.    AORTIC VALVE  The aortic valve is not well visualized. There is no aortic stenosis. There is no aortic regurgitation.    MITRAL VALVE  Structurally normal mitral valve. There is trivial mitral regurgitation.    TRICUSPID VALVE  The tricuspid valve is not well visualized. There is mild tricuspid regurgitation. Estimated right ventricular  systolic pressure is 21 mmHg.    PULMONIC VALVE  The pulmonic valve is not well visualized.    ARTERIES  The aortic root is normal size. The proximal ascending aorta appears normal. The pulmonary artery is not well visualized.    VENOUS  The IVC is normal in size with an inspiratory collapse of greater then 50%, suggesting normal right atrial pressure.    EFFUSION  There is no pericardial effusion.  +-----------------------------------------------------------------------------------------------------------------------------------------------------------------------------------+  :Normal Values                                                                                                                                                                      :  :IVSd: 0.7cm - 1.2cm                              LVIDd: 3.5cm - 5.5cm                                            LVIDs: 2.5cm - 4.0cm                                              :  :LVPWd: 0.7cm -  1.1cm                             LA: 1.9cm - 3.8cm                                               Ao: 2.0cm - 3.7cm                                                 :  :EF: (55 - 75%)                                   LA Area: <>                                                     RA Area: <>                                                       :  :RVd: 4.3cm                                       LV Mass(Men): <>                                                LV Mass(Women): <>                                                :  :                                                 LV Mass Index(Men): <116g>                                      LV Mass Index(Women): <96g>                                       :  +-----------------------------------------------------------------------------------------------------------------------------------------------------------------------------------+  MMode/2D Measurements & Calculations  IVSd: 0.94 cm  LVIDd: 4.2 cm                                                             LV mass(C)d: 130.9 grams  LVIDs: 2.9 cm                                                             LV mass(C)dI: 68.2 grams/m2  LVPWd: 0.99 cm  Ao root diam: 2.8 cm                              asc Aorta Diam: 2.9 cm                                                    LA/Ao: 1.1  LA dimension: 3.0 cm                                                                                                        LVOT diam: 1.9 cm  EDV(MOD-sp4): 73.6 ml                             TAPSE: 1.9 cm                                                             LA A4Cs: 15.1 cm2    LA ESV (MOD-BP): 40.4 ml                          LA volume MOD BP Indexed: 21.0 ml/m2                                      RWT: 0.47    Doppler Measurements & Calculations  MV E max vel: 101.0 cm/sec  Ao V2 max: 157.5 cm/sec  MV A max vel: 61.3 cm/sec                                     MV dec slope: 571.9 cm/sec2                                     Ao max PG: 9.9 mmHg  MV E/A: 1.6                                                   MV dec time: 0.18 sec  AVA(V,D): 1.9 cm2  LV V1 max PG: 4.9 mmHg                                        SV(LVOT): 62.2 ml                                               PA V2 max: 85.5 cm/sec  LV V1 mean PG: 2.5 mmHg                                                                                                       PA max PG: 2.9 mmHg  LV V1 max: 110.1 cm/sec  LV V1 mean: 72.9 cm/sec  LV V1 VTI: 22.8 cm  TR max vel: 210.4 cm/sec                                      RAP systole: 3.0 mmHg                                           AV VR: 0.70  TR max PG: 17.7 mmHg  RVSP(TR): 20.7 mmHg  MV P1/2t-pr_: 51.6 msec                                       MV LAT E': 10.7 cm/sec                                          MV LAT E/E': 9.5  MV MED E': 7.1 cm/sec  MV MED E/E': 14.3    ______________________________________________________________________________  Electronically Signed By:  Ernst Bosch, MD  on 03/08/2024 11:54 AM  Primary Care Physician: GRACIELA COACH  Performed By: Rosina Pepper, RDCS,RVT  MRN: [id number]          Stress Test Results:  Regadenoson stress test with myocardial perfusion SPECT (multi study) 03/10/2024    Narrative  NUCLEAR MEDICINE MYOCARDIAL SPECT SCAN AT REST AND FOLLOWING CARDIOLITE STRESS TEST, INCLUDING LEFT VENTRICULAR EJECTION FRACTION STUDY:    INDICATION: Chest pain    The patient has body weight of 91.4 kgs. The patient's height is 1.575 m.    TECHNIQUE:    RESTING CARDIOLITE STUDY:    Using a one  day rest/stress protocol, resting SPECT gated images were acquired 45 minutes after intravenous administration of 10.8 mCi of 35m Technetium Cardiolite.    The patient underwent stress test with Lexiscan stress test protocol.    The patient received 0.4mg /68ml of Lexiscan, after a flush of 5ml of saline the patient was administered Cardiolite.    The Lexiscan stress test has been interpreted by the attending physician, who monitored the study.    POST STRESS CARDIOLITE STUDY:    Following Lexiscan stress test, 31.6 mCi of 56m Technetium Cardiolite was administered intravenously.    45  minutes later gated stress  myocardial SPECT scan was performed.    FINDINGS:    Study Quality: Excellent    Lung Activity: Normal    TID: 0.92    Myocardial scan at rest and following stress test:    Moderate size area with mild to moderate grade perfusion defect in mid to apical anterior, mid anteroseptal and septal apical segments. The defect is fixed on rest and supine stress imaging and improves on prone imaging. The affected segments has normal wall motion on gated imaging. Rotating planar imaging show breast shadowing. The defect likely represent soft tissue (breast) attenuation with prior infarction considered  less likely. Normal perfusion of the remaining myocardial segments. There is no ischemia. No TID at stress.    Left ventricular wall motion study:    The left ventricular size was normal with normal left ventricular motion and normal wall thickening.    Left ventricular ejection fraction study:    Ejection fraction at stress was-80 % end-diastolic volume-61 mL with an systolic volume of 12 mL. Ejection fraction at rest was 81%    Impression  1. Probably normal Regadenoson Technetium perfusion scan negative for ischemia. Anterior/anteroseptal defect is favored to be due to breast tissue attenuation as detailed above.      2. Normal Left ventricular ejection fraction      Please note: This exam has been interpreted by the Cardiologist, not the radiologist.                              Dictated By: Cinderella LOISE Re, MD  Electronically Verified by: Cinderella LOISE Re, MD 03/10/2024 4:45 PM       Discharge Exam:  Vitals:    11/08/24 1636 11/08/24 2008 11/09/24 0512 11/09/24 0758   BP: 139/78 138/90 (!) 128/94 (!) 156/97   BP Location:  Left arm Left arm    Patient Position: Lying Lying Lying Sitting   Pulse: 71 81 62 79   Resp: 20 20 20 18    Temp: 36.6 C (97.9 F) 37.3 C (99.1 F) 36.7 C (98.1 F) 36.8 C (98.2 F)   TempSrc: Oral Oral Oral  Oral   SpO2: 98% 94% 96% 94%   Weight:   78.5 kg    Height:            Physical Exam  General:  No acute distress, AAO x3   Eye:  Pupils are equal, round and reactive to light, Extraocular movements are intact, Normal conjunctiva.    HENT:  Normocephalic.    Neck:  Supple, No jugular venous distention.    Respiratory:  normal breath sounds at lung bases with no wheezing or crackles.    Cardiovascular:  Regular rate, Normal rhythm, mild peripheral edema.    Gastrointestinal:  Soft, Non-tender, Non-distended, Normal bowel sounds.    Musculoskeletal:  No swelling, No deformity.    Integumentary:  Warm,  intact   Neurologic:  Alert, No focal defects.    Psychiatric:  Cooperative,   appropriate mood & affect, normal judgment.      Discharge Medications:  Current Discharge Medication List        START taking these medications    Details   cephalexin (Keflex) 500 MG capsule Take 1 capsule by mouth 2 times daily for 5 days.  Qty: 10 capsule, Refills: 0    Associated Diagnoses: Colitis      metroNIDAZOLE  (Flagyl ) 500 MG tablet Take 1 tablet by mouth 2 times daily for 5 days.  Qty: 10 tablet, Refills: 0    Associated Diagnoses: Colitis      naLOXone (Narcan) 4 mg/0.1 mL nasal spray Administer 1 spray into one nostril as needed for opioid reversal or respiratory depression. Call 911. If patient does not respond, or responds and then relapses, give additional doses every 2 to 3 minutes, alternating nostrils, until medical help arrives. Use as directed.  Qty: 2 each, Refills: 1      oxyCODONE  (Roxicodone ) 5 MG immediate release tablet Take 1 tablet by mouth every 8 hours as needed for moderate pain for up to 12 doses.  Qty: 12 tablet, Refills: 0           CONTINUE these medications which have NOT CHANGED    Details   acetaminophen  (Tylenol ) 500 MG tablet Take 500 mg by mouth every 6 hours as needed for mild pain.      aluminum  and magnesium  hydroxide-simethicone  (Antacid & Antigas) 200-200-20 mg/5 mL suspension Take 5 mL by mouth as needed.      LACTOBACILLUS RHAMNOSUS, GG, PO Take 1 capsule by mouth daily.      metoclopramide  (Reglan ) 10 MG tablet Take 10 mg by mouth 4 times a day. AC meals and nightly      pantoprazole  (Protonix ) 40 MG EC tablet Take 40 mg by mouth 2 times daily. Do not crush, chew, or split.      sucralfate  (Carafate ) 1 g tablet Take 1 g by mouth 4 times a day. ac meals and nightly      clonazePAM  (KlonoPIN ) 1 MG tablet Take 1 mg by mouth 2 times daily as needed for anxiety.              Outstanding issues:  None.    Pending labs/studies:  None.      Discharge Condition:  Stable    Discharge Disposition:  Home    Time Spent in Coordination of Discharge:  Greater than 30  min      Information given to patient/family/representative:  Information regarding appts, diagnoses, post-hospital care instructions and education materials is provided to the patient/family/rep in writing on the After Visit Summary at the  time of Discharge. A copy for the 'Provider' (PCP or Referring) is also given.        CARLETTE JAY, MD   Hospitalist  [facility]  [address] [place], [place], [place] , [address]        Quality Measures    Quality ID # 5- Heart Failure (HF): Angiotensin-Converting Enzyme (ACE) Inhibitor or Angiotensin Receptor Blocker (ARB) Therapy for Left Ventricular Systolic Dysfunction (LVSD) - All Patients 18 Years and Older with a Diagnosis of Heart Failure with a Current or Prior Left Ventricular Ejection Fraction (LVEF) < 40% Who Were Prescribed ACE Inhibitor or ARB Therapy at Hospital Discharge - applies to each hospital discharge  [_] ACE inhibitor or ARB therapy prescribed or currently being taken (4010F + 3021F)  [ ]  Documentation of medical reason(s) for not prescribing ACE inhibitor or AB therapy (eg, hypotensive patients who ae immediate risk of cardiogenic shock, hospitalized patients who have experienced marked azotemia, allergy, intolerance, other medical reasons) (4010F with 1P + 3021F)  [_] Documentation of patient reason(s) for not prescribing ACR inhibitor or ARB therapy (eg, patient declined, other patient reasons) (4010F with 2P + 3021F)  [_] Documentation of system reason(s) for not prescribing ACE inhibitor or ARB therapy (eg, other system reasons) (4010 with 3P + 3021F)  [_] Performance NOT met - Angiotensin converting enzyme (ACE) inhibitor or angiotensin receptor blocker (ARB) therapy was not prescribed, reason not otherwise specified  (4010F with 8P + 3021F)   [ x ] n/a    Quality ID #8 - Heart Failure (HF): Beta-Blocker Therapy for Left Ventricular Systolic Dysfunction (LVSD) -  All Patients 18 Years and Older with a Diagnosis of Heart  Failure with a Current or Prior LVEF < 40% Who Were Prescribed Beta-Blocker Therapy at [facility] Discharge - submit at each discharge  [  ]  Beta-blocker therapy prescribed (H1549 + ANTOINE.BARD)  [_] Beta-blocker therapy for LVEF< 40% not prescribed for reasons documented by the clinician (eg, low blood pressure, fluid overload, asthma, patients recently treated with an intravenous positive inotropic agent, allergy, intolerance, other medical reasons, patient declined, other patient reasons, or other reasons attributable to the healthcare system)  (H1548 + ANTOINE.BARD)  [_] Performance NOT met - beta-blocker not prescribed (H1547 + H1076)  [x]  n/a    Quality ID #326 - Atrial Fibrillation and Atrial Flutter: Chronic Anticoagulation Therapy - All Patients 18 Years and Older with a Diagnosis of Nonvalvular AF or Atrial Flutter Who Do Not Have a Documented CHA, DS, VASc Risk Score of 0 or 1 for whom warfarin or another FDA approved oral anticoagulant was prescribed - submit a minimum of once per performance period  [_] Patient has transient or reversible cause of AF (eg, pneumonia, hyperthyroidism, pregnancy, cardiac surgery) (H0070)  [_] Patients who are receiving comfort care only (H0069)   [_] Documentation of CHA, DS, VASc risk score of 0 or 1 (H0068)  [_] Warfarin or another FDA approved oral anticoagulant is prescribed (H1032)  [_] Documentation of medical reason(s) for not prescribing warfarin OR another FDA approved anticoagulant (eg, atrial appendage device in place) (H1031)  [_] Documentation of patient reason(s) for not prescribing warfarin OR anther FDA approved anticoagulant that is FDA approved for the prevention of thromboembolism (eg. Patient choice of having atrial appendage device placed) (H1030)  [_]Documentation of system reason(s) for not prescribing warfarin OR another FDA approved anticoagulation due to patient being currently enrolled in a clinical trial related to AF/atrial flutter treatment  (H0072)  [  _] Performance NOT met - Warfarin OR anther FDA approved anticoagulant not prescribed, reason not given (H0071)  [x ]n/a         [facility] Policy    Comment: Please note this report has been produced using speech recognition software and may contain errors related to that system including errors in grammar, punctuation, spelling and possibly words and phrases that may be inappropriate.  If there are any concerns or questions please feel free to contact the dictating provider for clarification.    "

## 2024-11-10 ENCOUNTER — Encounter

## 2024-11-14 ENCOUNTER — Inpatient Hospital Stay: Admit: 2024-11-14 | Payer: Medicaid (Managed Care) | Attending: Gastroenterology | Primary: Internal Medicine

## 2024-11-14 DIAGNOSIS — Q396 Congenital diverticulum of esophagus: Principal | ICD-10-CM

## 2024-11-14 MED ORDER — BARIUM SULFATE 60 % PO SUSP
60 | Freq: Once | ORAL | Status: AC | PRN
Start: 2024-11-14 — End: 2024-11-14
  Administered 2024-11-14: 16:00:00 50 mL via ORAL

## 2024-11-14 MED ORDER — SOD BICARB-CITRIC AC-SIMETH 2.21-1.53-0.04 G PO PACK
2.21-1.53-0.04 | Freq: Once | ORAL | Status: AC
Start: 2024-11-14 — End: 2024-11-14
  Administered 2024-11-14: 16:00:00 1 g via ORAL

## 2024-11-14 MED ORDER — BARIUM SULFATE 700 MG PO TABS
700 | Freq: Once | ORAL | Status: AC | PRN
Start: 2024-11-14 — End: 2024-11-14
  Administered 2024-11-14: 16:00:00 1 via ORAL

## 2024-11-14 MED ORDER — BARIUM SULFATE 98 % PO SUSR
98 | Freq: Once | ORAL | Status: AC | PRN
Start: 2024-11-14 — End: 2024-11-14
  Administered 2024-11-14: 16:00:00 75 mL via ORAL

## 2024-11-14 MED FILL — E-Z-DISK 700 MG PO TABS: 700 mg | ORAL | Qty: 1 | Fill #0

## 2024-11-14 MED FILL — E-Z-HD 98 % PO SUSR: 98 % | ORAL | Qty: 140 | Fill #0

## 2024-11-14 MED FILL — LIQUID E-Z-PAQUE 60 % PO SUSP: 60 % | ORAL | Qty: 355 | Fill #0

## 2024-11-14 MED FILL — E-Z-GAS II 2.21-1.53-0.04 G PO PACK: 2.21-1.53-0.04 g | ORAL | Qty: 1 | Fill #0

## 2024-12-02 ENCOUNTER — Inpatient Hospital Stay: Payer: Medicaid (Managed Care) | Primary: Internal Medicine

## 2024-12-02 ENCOUNTER — Ambulatory Visit: Payer: Medicaid (Managed Care) | Primary: Internal Medicine

## 2024-12-29 ENCOUNTER — Inpatient Hospital Stay
Admit: 2024-12-29 | Discharge: 2024-12-29 | Discharge status: Against Medical Advice | Payer: Medicaid (Managed Care) | Arrived: WI | Attending: Student in an Organized Health Care Education/Training Program

## 2024-12-29 ENCOUNTER — Emergency Department: Payer: Medicaid (Managed Care) | Primary: Internal Medicine

## 2024-12-29 ENCOUNTER — Emergency Department: Admit: 2024-12-29 | Payer: Medicaid (Managed Care) | Primary: Internal Medicine

## 2024-12-29 NOTE — ED Triage Notes (Signed)
"  Pt arrives to ED by self. Pt reports 9/10 pain/tingling in LUE and LLE x5 days. Took tramadol  today and advised she is in pain management. Sent over by Urgent Care.   "

## 2024-12-29 NOTE — ED Notes (Signed)
"  Followed up with Reena Lander on 12/29/2024 at 4:44 PM. Patient left the ED with a disposition of AMA on . Patient cited no reason given as reason. Advised patient to follow up with a primary care physician or return to the Emergency Department if symptoms worsen.Pt refused to sign AMA form. Ambulatory out of ER.   Suzen Perri Pringle, RN   "

## 2024-12-29 NOTE — ED Provider Notes (Signed)
 "[facility] EMERGENCY DEPT  EMERGENCY DEPARTMENT HISTORY AND PHYSICAL EXAM      Date of evaluation: 12/29/2024  Patient Name: Yolanda Weber [birth year]  MRN: [id number]  ED Provider: Doyal Cheron Gins, MD   Note Started: 4:41 PM EST 12/29/24    HISTORY OF PRESENT ILLNESS     Chief Complaint   Patient presents with    Tingling       History Provided By: Patient     HPI: Tanequa Kretz is a 54 y.o. female with past medical history as reviewed below who presents with multiple complaints.  Patient reports malaise for the past week, reports cough and pain in her left arm and left leg for the past 5 days.  Also reports nausea and vomiting in the last several days that has now improved, started having diarrhea today.  Initially reported that she was concerned because none of her symptoms had improved, then stated that her cough is actually resolved.  She is requesting labs to make sure my white blood cell count is okay.  She went to urgent care today and was referred to the ER, she stated that she did not know why.  Paperwork from the urgent care states that she was sent over because she complained of numbness and tingling in her left upper and lower extremity.  She denies any numbness currently, is reporting pain.    PAST MEDICAL HISTORY   Past Medical History:  Past Medical History:   Diagnosis Date    Angiomyolipoma of both kidneys     Bulging lumbar disc     Diverticulosis     Fibromyalgia     Sciatica     Sickle cell trait        Past Surgical History:  Past Surgical History:   Procedure Laterality Date    BREAST BIOPSY      per pt unsure which breast cyst was removed    CHOLECYSTECTOMY      COLONOSCOPY      COLONOSCOPY N/A 09/24/2024    COLONOSCOPY performed by Myriam Nivia Raddle., MD at [facility] ENDOSCOPY    CYST REMOVAL Right     Breast    DILATION AND CURETTAGE OF UTERUS      UPPER GASTROINTESTINAL ENDOSCOPY N/A 09/24/2024    ESOPHAGOGASTRODUODENOSCOPY BIOPSY performed by Myriam Nivia Raddle., MD at [facility] ENDOSCOPY        Family History:  Family History   Problem Relation Age of Onset    Sickle Cell Anemia Mother     COPD Mother     High Blood Pressure Father     Breast Cancer Maternal Grandmother        Social History:  Social History     Tobacco Use    Smoking status: Some Days     Types: Cigarettes     Passive exposure: Never    Smokeless tobacco: Never   Vaping Use    Vaping status: Never Used   Substance Use Topics    Alcohol use: Never    Drug use: Never       Allergies:  Allergies   Allergen Reactions    Shellfish Allergy Hives    Ibuprofen Nausea And Vomiting       PCP: [name], MD    Current Meds:   No current facility-administered medications for this encounter.     Current Outpatient Medications   Medication Sig Dispense Refill    aluminum  & magnesium  hydroxide-simethicone  (MAALOX PLUS) 200-200-20 MG/5ML SUSP suspension  Take 30 mLs by mouth every 6 hours as needed for Indigestion 355 mL 0    lactobacillus (CULTURELLE) capsule Take 1 capsule by mouth daily (with breakfast) 15 capsule 0    metoclopramide  (REGLAN ) 10 MG tablet Take 1 tablet by mouth 4 times daily (before meals and nightly) 120 tablet 3    pantoprazole  (PROTONIX ) 40 MG tablet Take 1 tablet by mouth 2 times daily (with meals) 30 tablet 3    sucralfate  (CARAFATE ) 1 GM tablet Take 1 tablet by mouth 4 times daily (before meals and nightly) 120 tablet 1    cholestyramine  light 4 g packet Take 1 packet by mouth 2 times daily as needed (diarhea) 60 packet 3    nicotine  (NICODERM CQ ) 14 MG/24HR Place 1 patch onto the skin daily 30 patch 0    ondansetron  (ZOFRAN -ODT) 4 MG disintegrating tablet Take 1 tablet by mouth 3 times daily as needed for Nausea or Vomiting 21 tablet 0    clonazePAM  (KLONOPIN ) 1 MG tablet Take 1 tablet by mouth 2 times daily.         Social Determinants of Health:   Social Drivers of Health     Tobacco Use: High Risk (11/13/2024)    Received from [facility]    Patient History     Smoking Tobacco Use: Some Days     Smokeless  Tobacco Use: Never     Passive Exposure: Not on file   Alcohol Use: Not At Risk (12/29/2024)    AUDIT-C     Frequency of Alcohol Consumption: Never     Average Number of Drinks: Patient does not drink     Frequency of Binge Drinking: Never   Financial Resource Strain: Not on file   Food Insecurity: No Food Insecurity (11/07/2024)    Received from [facility]    Hunger Vital Sign     Within the past 12 months, you worried that your food would run out before you got the money to buy more.: Never true     Within the past 12 months, the food you bought just didn't last and you didn't have money to get more.: Never true   Transportation Needs: No Transportation Needs (11/07/2024)    Received from [facility] - Transportation     In the past 12 months, has lack of transportation kept you from medical appointments or from getting medications?: No     In the past 12 months, has lack of transportation kept you from meetings, work, or from getting things needed for daily living?: No   Physical Activity: Not on file   Stress: Not on file   Social Connections: Unknown (06/12/2023)    Received from [employer]     How often do you feel lonely or isolated from those around you? (Adult - for ages 6 years and over): Not on file   Intimate Partner Violence: Not on file   Depression: Not at risk (03/30/2020)    Received from [facility] (historical as of Sept 27, 2025)    PHQ-2     PHQ-9 Total Score: 0   Housing Stability: Low Risk (11/07/2024)    Received from [facility] Stability Vital Sign     In the last 12 months, was there a time when you were not able to pay the mortgage or rent on time?: No     In the past 12 months, how many times  have you moved where you were living?: 0     At any time in the past 12 months, were you homeless or living in a shelter (including now)?: No   Interpersonal Safety: Not At Risk (10/06/2024)    Interpersonal Safety Domain Source: IP Abuse Screening      Physical abuse: Denies     Verbal abuse: Denies     Emotional abuse: Denies     Financial abuse: Denies     Sexual abuse: Denies   Utilities: Not At Risk (11/07/2024)    Received from [facility] Utilities     In the past 12 months has the electric, gas, oil, or water  company threatened to shut off services in your home?: No     PHYSICAL EXAM   Physical Exam  Constitutional:       General: She is not in acute distress.  HENT:      Head: Normocephalic and atraumatic.   Eyes:      Extraocular Movements: Extraocular movements intact.   Cardiovascular:      Rate and Rhythm: Normal rate and regular rhythm.   Pulmonary:      Effort: Pulmonary effort is normal. No respiratory distress.      Breath sounds: Normal breath sounds.   Abdominal:      General: There is no distension.      Palpations: Abdomen is soft.      Tenderness: There is abdominal tenderness (mild left upper and lower quadrant tenderness).   Musculoskeletal:         General: No swelling or signs of injury.      Cervical back: Neck supple.      Comments: Tenderness to light touch along the left paraspinal neck, trapezius muscle, entire left arm and entire left leg, no swelling, full range of motion of the shoulder, elbow, wrist, hip, knee, ankle.  Patient is ambulating without assistance, fully weightbearing.   Skin:     General: Skin is warm and dry.   Neurological:      Mental Status: She is alert and oriented to person, place, and time.      Comments: NIH 0         SCREENINGS                No data recorded       LAB, EKG AND DIAGNOSTIC RESULTS   Labs:  No results found for this or any previous visit (from the past 12 hours).    EKG:.Not Applicable     Radiologic Studies:  Radiographic images are visualized and preliminarily interpreted by the ED Provider with the following findings: Not Applicable..     Interpretation per the Radiologist below, if available at the time of this note:  XR CHEST PORTABLE    (Results Pending)   CT ABDOMEN PELVIS W IV  CONTRAST Additional Contrast? None    (Results Pending)   CT HEAD WO CONTRAST    (Results Pending)      ED COURSE and DIFFERENTIAL DIAGNOSIS/MDM   Differential and Considerations of tests not ordered: 54 year old female presenting with multiple complaints.  Vitals are stable, patient is awake and alert, no acute distress.  Constellation of symptoms including cough, body aches, vomiting and diarrhea appear most consistent with viral syndrome.  I discussed with patient that symptoms from the flu or for other viruses can sometimes last 7 to 10 days.  Patient then stated that she believes the diarrhea is chronic, states  that she has also had nausea and vomiting from other causes, and is worried that there is something else going on.  She is requesting labs, explained that her white blood cell count may also be elevated from a virus and may not give us  any additional information however we can obtain this if she would like.  Patient also expressed concern that she had diverticulitis because of her diarrhea, I have ordered a CT abdomen/pelvis.  Her NIH is 0, she currently does not have any numbness or weakness in her left upper and lower extremity, however given her concerns we will order CT head, symptoms do not appear consistent with stroke and patient would be out of the window for any interventions.    Records Reviewed: Prior medical records and Nursing notes. See ED Course for summary.   Vitals:    Vitals:    12/29/24 1540 12/29/24 1600   BP: (!) 154/100 (!) 156/102   Pulse: 96    Resp: 18    Temp: 97.7 F (36.5 C)    TempSrc: Oral    SpO2: 96% 95%   Weight: 72.6 kg (160 lb)    Height: 1.575 m (5' 2)         ED COURSE  ED Course as of 12/29/24 1649   Mon Dec 29, 2024   1640 Patient requesting to leave AMA, refusing to speak to provider or provide reasons as to why she wants to leave AMA. RN will provide form for patient to sign. [DM]      ED Course User Index  [DM] Dodson Doyal Leash, MD       Clinical  Management Tools:      Smoking Cessation: Not Applicable    Patient was given the following medications:  Medications - No data to display    CONSULTS: See ED Course/MDM for further details.  None   PROCEDURES   Unless otherwise noted above, none  Procedures    SEPSIS REASSESSMENT & CRITICAL CARE TIME   SEPSIS REASSESSMENT: No suspicion of bacterial infection and not having 2 SIRS during this visit.    Patient does not meet Critical Care Time, 0 minutes  CLINICAL IMPRESSIONS     1. Viral syndrome         SDOH/DISPOSITION/PLAN   Social Determinants affecting Treatment Plan: Patient has concerns for health literacy. Additional time provided in explanation    DISPOSITION Ama 12/29/2024 04:41:23 PM   DISPOSITION CONDITION Stable         AMA Informed Refusal: 4:47 PM EST Informed Refusal     The patient is capable of making an informed decision to refuse my all or part of my recommendations. The patient is alert and oriented with retained perception and understanding of events, context, and relative risk.  It is my opinion that there is no medical alternative to the plan recommended and there exists an immediate threat to life or limb.    The patient is refusing the following elements of my recommended plan of action: labs and CT imaging    Risks of leaving AMA were explained by RN as patient refused to speak to provider or provide reasons as to why she wanted to leave.    The patient also understands the risks with refusing my recommendations.    The patient understands that by refusing all or part of my recommendations, [he/she] accepts and tolerates greater risk.   Changing the patient's course and risk would require taking away the patient's civil rights  and autonomy with chemical or physical restraints. Given the facts at this time, I do not have sufficient ethical or legal justification to take away the patient's civil rights and autonomy. The patient has REFUSED my recommendation and chooses to leave AGAINST  MEDICAL ADVICE.    The patient has been instructed that he/she can return to the emergency department at any time if he/she choses to accept my  recommended treatment. When asked to sign AMA form, patient refused. This discussion was witnessed by Suzen RN                                   PATIENT REFERRED TO:  No follow-up provider specified.      DISCHARGE MEDICATIONS:     Medication List        ASK your doctor about these medications      aluminum  & magnesium  hydroxide-simethicone  200-200-20 MG/5ML Susp suspension  Commonly known as: MAALOX PLUS  Take 30 mLs by mouth every 6 hours as needed for Indigestion     cholestyramine  light 4 g packet  Take 1 packet by mouth 2 times daily as needed (diarhea)     clonazePAM  1 MG tablet  Commonly known as: KLONOPIN      lactobacillus capsule  Take 1 capsule by mouth daily (with breakfast)     metoclopramide  10 MG tablet  Commonly known as: REGLAN   Take 1 tablet by mouth 4 times daily (before meals and nightly)     nicotine  14 MG/24HR  Commonly known as: NICODERM CQ   Place 1 patch onto the skin daily     ondansetron  4 MG disintegrating tablet  Commonly known as: ZOFRAN -ODT  Take 1 tablet by mouth 3 times daily as needed for Nausea or Vomiting     pantoprazole  40 MG tablet  Commonly known as: PROTONIX   Take 1 tablet by mouth 2 times daily (with meals)     sucralfate  1 GM tablet  Commonly known as: CARAFATE   Take 1 tablet by mouth 4 times daily (before meals and nightly)                DISCONTINUED MEDICATIONS:  Current Discharge Medication List          I am the Primary Clinician of Record. Doyal Cheron Gins, MD (electronically signed)    (Please note that parts of this dictation were completed with voice recognition software. Quite often unanticipated grammatical, syntax, homophones, and other interpretive errors are inadvertently transcribed by the computer software. Please disregards these errors. Please excuse any errors that have escaped final proofreading.)      Gins Doyal Cheron, MD  12/29/24 1650    "

## 2024-12-29 NOTE — ED Notes (Signed)
"  This RN went to draw bloodwork and Pt requested house supervisor to bedside. House supervisor was in ED and came to bedside. Pt verbalized don't send that doctor back in here. HS explained ED provider is our only provider at this time. Pt then stated I am leaving then. Notified primary RN and ED provider.  "

## 2025-01-07 ENCOUNTER — Ambulatory Visit: Payer: Medicaid (Managed Care) | Primary: Internal Medicine

## 2025-01-07 ENCOUNTER — Inpatient Hospital Stay: Payer: Medicaid (Managed Care) | Primary: Internal Medicine

## 2025-01-15 ENCOUNTER — Ambulatory Visit
Admit: 2025-01-15 | Discharge: 2025-01-15 | Payer: Medicaid (Managed Care) | Attending: Gastroenterology | Primary: Internal Medicine

## 2025-01-15 VITALS — BP 122/76 | HR 88 | Temp 98.70000°F | Resp 18 | Ht 62.0 in | Wt 158.4 lb

## 2025-01-15 DIAGNOSIS — K2100 Gastro-esophageal reflux disease with esophagitis, without bleeding: Principal | ICD-10-CM

## 2025-01-15 MED ORDER — CYPROHEPTADINE HCL 4 MG PO TABS
4 | ORAL_TABLET | Freq: Three times a day (TID) | ORAL | 3 refills | 60.00000 days | Status: AC
Start: 2025-01-15 — End: ?

## 2025-01-15 MED ORDER — OMEPRAZOLE 40 MG PO CPDR
40 | ORAL_CAPSULE | Freq: Every day | ORAL | 5 refills | 90.00000 days | Status: AC
Start: 2025-01-15 — End: ?

## 2025-01-15 NOTE — Progress Notes (Signed)
 Chief Complaint   Patient presents with    Gastroesophageal Reflux     States has acid reflux real bad.  Acid comes up in throat real bad.  PCP gave her Protonix   which has helped.    Diarrhea     Seen in ED on 01/03/2025 and had CT which showed constipation.  Patient states she was not constipated.     BP 122/76 (BP Site: Right Upper Arm, Patient Position: Sitting, BP Cuff Size: Medium Adult)   Pulse 88   Temp 98.7 F (37.1 C) (Oral)   Resp 18   Ht 1.575 m (5' 2)   Wt 71.8 kg (158 lb 6 oz)   SpO2 97%   BMI 28.97 kg/m

## 2025-01-17 NOTE — Progress Notes (Signed)
 Yolanda Weber is a 54 y.o. female who presents today for the following:  Chief Complaint   Patient presents with    Gastroesophageal Reflux     States has acid reflux real bad.  Acid comes up in throat real bad.  PCP gave her Protonix   which has helped.    Diarrhea     Seen in ED on 01/03/2025 and had CT which showed constipation.  Patient states she was not constipated.    Results     Barium swallow.         Allergies   Allergen Reactions    Shellfish Allergy Hives    Ibuprofen Nausea And Vomiting       Current Outpatient Medications   Medication Sig Dispense Refill    ARIPiprazole (ABILIFY) 5 MG tablet Take 1 tablet by mouth at bedtime      pantoprazole  (PROTONIX ) 20 MG tablet Take 1 tablet by mouth daily      amoxicillin  (AMOXIL ) 500 MG capsule Take 1 capsule by mouth 2 times daily      escitalopram (LEXAPRO) 10 MG tablet Take 1 tablet by mouth daily      omeprazole  (PRILOSEC) 40 MG delayed release capsule Take 1 capsule by mouth daily (before dinner) 30 capsule 5    cyproheptadine  (PERIACTIN ) 4 MG tablet Take 1 tablet by mouth 3 times daily 90 tablet 3     No current facility-administered medications for this visit.       Past Medical History:   Diagnosis Date    Angiomyolipoma of both kidneys     Bulging lumbar disc     Diverticulosis     Fibromyalgia     Sciatica     Sickle cell trait        Past Surgical History:   Procedure Laterality Date    BREAST BIOPSY      per pt unsure which breast cyst was removed    CHOLECYSTECTOMY      COLONOSCOPY      COLONOSCOPY N/A 09/24/2024    COLONOSCOPY performed by Myriam Nivia Raddle., MD at [facility] ENDOSCOPY    CYST REMOVAL Right     Breast    DILATION AND CURETTAGE OF UTERUS      UPPER GASTROINTESTINAL ENDOSCOPY N/A 09/24/2024    ESOPHAGOGASTRODUODENOSCOPY BIOPSY performed by Myriam Nivia Raddle., MD at [facility] ENDOSCOPY       Family History   Problem Relation Age of Onset    Sickle Cell Anemia Mother     COPD Mother     High Blood Pressure Father     Breast Cancer Maternal  Grandmother        Social History     Socioeconomic History    Marital status: Single     Spouse name: Not on file    Number of children: Not on file    Years of education: Not on file    Highest education level: Not on file   Occupational History    Not on file   Tobacco Use    Smoking status: Some Days     Types: Cigarettes     Passive exposure: Never    Smokeless tobacco: Never   Vaping Use    Vaping status: Never Used   Substance and Sexual Activity    Alcohol use: Yes     Alcohol/week: 0.0 - 1.0 standard drinks of alcohol    Drug use: Never    Sexual activity: Not on file   Other Topics Concern  Not on file   Social History Narrative    Not on file     Social Drivers of Health     Financial Resource Strain: Not on file   Food Insecurity: No Food Insecurity (11/07/2024)    Received from [facility]    Hunger Vital Sign     Within the past 12 months, you worried that your food would run out before you got the money to buy more.: Never true     Within the past 12 months, the food you bought just didn't last and you didn't have money to get more.: Never true   Transportation Needs: No Transportation Needs (11/07/2024)    Received from [facility] - Transportation     In the past 12 months, has lack of transportation kept you from medical appointments or from getting medications?: No     In the past 12 months, has lack of transportation kept you from meetings, work, or from getting things needed for daily living?: No   Physical Activity: Not on file   Stress: Not on file   Social Connections: Unknown (06/12/2023)    Received from [employer]     How often do you feel lonely or isolated from those around you? (Adult - for ages 74 years and over): Not on file   Intimate Partner Violence: Not on file   Housing Stability: Low Risk (11/07/2024)    Received from [facility] Stability Vital Sign     In the last 12 months, was there a time when you were not able to pay the mortgage or  rent on time?: No     In the past 12 months, how many times have you moved where you were living?: 0     At any time in the past 12 months, were you homeless or living in a shelter (including now)?: No         54 year old female with history of gastroesophageal reflux disease, peptic ulcer disease, sciatica, fibromyalgia, lumbar disc disease, diverticular disease, sickle cell trait, angiomyolipoma of the kidney and proximal esophageal diverticulum who comes in for follow-up visit.  On 09/25/2023 patient had an EGD and colonoscopy which showed generalized diverticular Kalosis, antral gastritis, distal esophagitis, and a proximal esophageal diverticulum.  She subsequently had a barium swallow on 11/14/2024 which showed moderate to severe gastroesophageal reflux.  Otherwise normal upper GI series.  Patient states she had a bad bout of GERD with regurgitation.  She saw her PCP and started on pantoprazole  20 mg daily.  Patient has to take a lot of Tums on a daily basis.  She has some left upper quadrant pain.    Gastroesophageal Reflux  She complains of abdominal pain. She reports no nausea.   Diarrhea   Associated symptoms include abdominal pain and arthralgias. Pertinent negatives include no vomiting.         Review of Systems   Constitutional: Negative.    HENT:  Negative for nosebleeds.    Respiratory: Negative.     Cardiovascular: Negative.    Gastrointestinal:  Positive for abdominal pain and diarrhea. Negative for abdominal distention, anal bleeding, blood in stool, constipation, nausea, rectal pain and vomiting.   Genitourinary: Negative.    Musculoskeletal:  Positive for arthralgias.   Skin: Negative.    Allergic/Immunologic: Negative.    Neurological: Negative.    Hematological: Negative.    All other systems reviewed and are negative.  BP 122/76 (BP Site: Right Upper Arm, Patient Position: Sitting, BP Cuff Size: Medium Adult)   Pulse 88   Temp 98.7 F (37.1 C) (Oral)   Resp 18   Ht 1.575 m (5' 2)    Wt 71.8 kg (158 lb 6 oz)   SpO2 97%   BMI 28.97 kg/m     Physical Exam  Vitals and nursing note reviewed.   Constitutional:       Appearance: Normal appearance.   HENT:      Head: Normocephalic and atraumatic.      Nose: Nose normal.   Eyes:      General: No scleral icterus.  Cardiovascular:      Rate and Rhythm: Normal rate and regular rhythm.      Pulses: Normal pulses.      Heart sounds: Normal heart sounds.   Pulmonary:      Effort: Pulmonary effort is normal.      Breath sounds: Normal breath sounds.   Abdominal:      General: Abdomen is flat. There is no distension.      Palpations: Abdomen is soft. There is no mass.      Tenderness: There is abdominal tenderness. There is no right CVA tenderness, left CVA tenderness, guarding or rebound.      Hernia: No hernia is present.   Musculoskeletal:      Right lower leg: No edema.      Left lower leg: No edema.   Skin:     General: Skin is warm and dry.   Neurological:      General: No focal deficit present.      Mental Status: She is alert and oriented to person, place, and time. Mental status is at baseline.   Psychiatric:         Mood and Affect: Mood normal.         Behavior: Behavior normal.         Thought Content: Thought content normal.         Judgment: Judgment normal.          1. Gastroesophageal reflux disease with esophagitis without hemorrhage  Will start patient on omeprazole  40 mg daily.  - omeprazole  (PRILOSEC) 40 MG delayed release capsule; Take 1 capsule by mouth daily (before dinner)  Dispense: 30 capsule; Refill: 5    2. Esophageal diverticulum  And proximal esophageal diverticulum was noted on the endoscope but was not mention at the time of the barium swallow.    3. Diverticular disease of colon  May be cause of patient's pain in left lower quadrant.    4. Anorexia  Will give patient a trial of appetite stimulant with the cyproheptadine .  - cyproheptadine  (PERIACTIN ) 4 MG tablet; Take 1 tablet by mouth 3 times daily  Dispense: 90 tablet;  Refill: 3       Gizel Riedlinger Myriam Raddle, MD

## 2025-02-09 ENCOUNTER — Ambulatory Visit: Payer: Medicaid (Managed Care) | Primary: Internal Medicine

## 2025-02-09 ENCOUNTER — Inpatient Hospital Stay: Payer: Medicaid (Managed Care) | Primary: Internal Medicine
